# Patient Record
Sex: Female | Born: 1935 | Race: Black or African American | Hispanic: No | Smoking: Never smoker
Health system: Southern US, Community
[De-identification: ages and names within clinical notes are randomized; demographics above are authoritative.]

## PROBLEM LIST (undated history)

## (undated) DIAGNOSIS — G629 Polyneuropathy, unspecified: Secondary | ICD-10-CM

## (undated) DIAGNOSIS — E890 Postprocedural hypothyroidism: Secondary | ICD-10-CM

## (undated) DIAGNOSIS — I10 Essential (primary) hypertension: Secondary | ICD-10-CM

## (undated) DIAGNOSIS — R609 Edema, unspecified: Secondary | ICD-10-CM

## (undated) DIAGNOSIS — I2699 Other pulmonary embolism without acute cor pulmonale: Secondary | ICD-10-CM

## (undated) HISTORY — PX: TOTAL THYROIDECTOMY: SHX2547

---
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
## 2006-10-24 HISTORY — PX: REPLACEMENT TOTAL KNEE: SUR1224

## 2013-10-24 DIAGNOSIS — H3411 Central retinal artery occlusion, right eye: Secondary | ICD-10-CM

## 2013-10-24 HISTORY — DX: Central retinal artery occlusion, right eye: H34.11

## 2017-10-24 HISTORY — PX: COLONOSCOPY: SHX174

## 2018-12-23 DIAGNOSIS — I8290 Acute embolism and thrombosis of unspecified vein: Secondary | ICD-10-CM

## 2018-12-23 HISTORY — DX: Acute embolism and thrombosis of unspecified vein: I82.90

## 2020-08-27 DIAGNOSIS — Z86711 Personal history of pulmonary embolism: Secondary | ICD-10-CM | POA: Diagnosis not present

## 2020-08-27 DIAGNOSIS — E039 Hypothyroidism, unspecified: Secondary | ICD-10-CM | POA: Diagnosis not present

## 2020-08-27 DIAGNOSIS — Z7901 Long term (current) use of anticoagulants: Secondary | ICD-10-CM | POA: Diagnosis not present

## 2020-08-27 DIAGNOSIS — I6930 Unspecified sequelae of cerebral infarction: Secondary | ICD-10-CM | POA: Diagnosis not present

## 2020-09-12 ENCOUNTER — Encounter (HOSPITAL_COMMUNITY): Payer: Self-pay | Admitting: *Deleted

## 2020-09-12 ENCOUNTER — Other Ambulatory Visit: Payer: Self-pay

## 2020-09-12 ENCOUNTER — Emergency Department (HOSPITAL_COMMUNITY)
Admission: EM | Admit: 2020-09-12 | Discharge: 2020-09-12 | Disposition: A | Discharge status: Home / Self Care | Payer: Medicare Other | Attending: Emergency Medicine | Admitting: Emergency Medicine

## 2020-09-12 ENCOUNTER — Emergency Department (HOSPITAL_COMMUNITY): Payer: Medicare Other

## 2020-09-12 DIAGNOSIS — Z79899 Other long term (current) drug therapy: Secondary | ICD-10-CM | POA: Diagnosis not present

## 2020-09-12 DIAGNOSIS — R1032 Left lower quadrant pain: Secondary | ICD-10-CM | POA: Insufficient documentation

## 2020-09-12 DIAGNOSIS — R0602 Shortness of breath: Secondary | ICD-10-CM | POA: Diagnosis not present

## 2020-09-12 DIAGNOSIS — K8020 Calculus of gallbladder without cholecystitis without obstruction: Secondary | ICD-10-CM | POA: Diagnosis not present

## 2020-09-12 DIAGNOSIS — Z7901 Long term (current) use of anticoagulants: Secondary | ICD-10-CM | POA: Diagnosis not present

## 2020-09-12 DIAGNOSIS — R1031 Right lower quadrant pain: Secondary | ICD-10-CM | POA: Insufficient documentation

## 2020-09-12 DIAGNOSIS — I898 Other specified noninfective disorders of lymphatic vessels and lymph nodes: Secondary | ICD-10-CM | POA: Diagnosis not present

## 2020-09-12 DIAGNOSIS — R1030 Lower abdominal pain, unspecified: Secondary | ICD-10-CM | POA: Diagnosis not present

## 2020-09-12 DIAGNOSIS — R109 Unspecified abdominal pain: Secondary | ICD-10-CM

## 2020-09-12 DIAGNOSIS — N281 Cyst of kidney, acquired: Secondary | ICD-10-CM | POA: Diagnosis not present

## 2020-09-12 DIAGNOSIS — K5730 Diverticulosis of large intestine without perforation or abscess without bleeding: Secondary | ICD-10-CM | POA: Diagnosis not present

## 2020-09-12 HISTORY — DX: Other pulmonary embolism without acute cor pulmonale: I26.99

## 2020-09-12 LAB — URINALYSIS, ROUTINE W REFLEX MICROSCOPIC
Bilirubin Urine: NEGATIVE
Glucose, UA: NEGATIVE mg/dL
Hgb urine dipstick: NEGATIVE
Ketones, ur: NEGATIVE mg/dL
Leukocytes,Ua: NEGATIVE
Nitrite: NEGATIVE
Protein, ur: NEGATIVE mg/dL
Specific Gravity, Urine: 1.039 — ABNORMAL HIGH (ref 1.005–1.030)
pH: 7 (ref 5.0–8.0)

## 2020-09-12 LAB — CBC
HCT: 39.9 % (ref 36.0–46.0)
Hemoglobin: 13 g/dL (ref 12.0–15.0)
MCH: 28.4 pg (ref 26.0–34.0)
MCHC: 32.6 g/dL (ref 30.0–36.0)
MCV: 87.1 fL (ref 80.0–100.0)
Platelets: 169 10*3/uL (ref 150–400)
RBC: 4.58 MIL/uL (ref 3.87–5.11)
RDW: 13.9 % (ref 11.5–15.5)
WBC: 4.3 10*3/uL (ref 4.0–10.5)
nRBC: 0 % (ref 0.0–0.2)

## 2020-09-12 LAB — TROPONIN I (HIGH SENSITIVITY)
Troponin I (High Sensitivity): <2 ng/L (ref <18)
Troponin I (High Sensitivity): <2 ng/L (ref <18)

## 2020-09-12 LAB — COMPREHENSIVE METABOLIC PANEL
ALT: 11 U/L (ref 0–44)
AST: 17 U/L (ref 15–41)
Albumin: 3.5 g/dL (ref 3.5–5.0)
Alkaline Phosphatase: 77 U/L (ref 38–126)
Anion gap: 8 (ref 5–15)
BUN: 8 mg/dL (ref 8–23)
CO2: 26 mmol/L (ref 22–32)
Calcium: 9.1 mg/dL (ref 8.9–10.3)
Chloride: 105 mmol/L (ref 98–111)
Creatinine, Ser: 0.77 mg/dL (ref 0.44–1.00)
GFR, Estimated: >60 mL/min (ref >60)
Glucose, Bld: 111 mg/dL — ABNORMAL HIGH (ref 70–99)
Potassium: 3.8 mmol/L (ref 3.5–5.1)
Sodium: 139 mmol/L (ref 135–145)
Total Bilirubin: 0.8 mg/dL (ref 0.3–1.2)
Total Protein: 7 g/dL (ref 6.5–8.1)

## 2020-09-12 LAB — LIPASE, BLOOD: Lipase: 27 U/L (ref 11–51)

## 2020-09-12 MED ORDER — SODIUM CHLORIDE 0.9 % IV BOLUS
500.0000 mL | Freq: Once | INTRAVENOUS | Status: AC
  Administered 2020-09-12: 500 mL via INTRAVENOUS

## 2020-09-12 MED ORDER — IOHEXOL 300 MG/ML  SOLN
100.0000 mL | Freq: Once | INTRAMUSCULAR | Status: AC | PRN
  Administered 2020-09-12: 100 mL via INTRAVENOUS

## 2020-09-12 MED ORDER — FENTANYL CITRATE (PF) 100 MCG/2ML IJ SOLN
50.0000 ug | Freq: Once | INTRAMUSCULAR | Status: AC
  Administered 2020-09-12: 50 ug via INTRAVENOUS
  Filled 2020-09-12: qty 2

## 2020-09-12 NOTE — ED Notes (Signed)
Pt transported to CT ?

## 2020-09-12 NOTE — ED Provider Notes (Signed)
[facility] EMERGENCY DEPARTMENT Provider Note   CSN: [id number] Arrival date & time: 09/12/20  6503     History Chief Complaint  Patient presents with  . Abdominal Pain    [name] is a 86 y.o. female.  HPI 86 year old female presents with abdominal pain. Started 2 days ago but seems to be worse this morning. Diffusely lower abdominal pain. Feels like a burning sensation. She has been urinating more but also has been taking her as needed Lasix more. She feels a little short of breath but no chest pain. No vomiting or diarrhea/constipation. Stools were dark recently but not bloody. She is on Eliquis for pulmonary embolism and DVT. Pain is currently severe. Some back pain.   Past Medical History:  Diagnosis Date  . Pulmonary emboli (HCC)     There are no problems to display for this patient.   History reviewed. No pertinent surgical history.   OB History   No obstetric history on file.     No family history on file.  Social History   Tobacco Use  . Smoking status: Never Smoker  . Smokeless tobacco: Never Used  Substance Use Topics  . Alcohol use: Never  . Drug use: Never    Home Medications Prior to Admission medications   Medication Sig Start Date End Date Taking? Authorizing Provider  apixaban (ELIQUIS) 5 MG TABS tablet Take 5 mg by mouth 2 (two) times daily.   Yes [provider]  furosemide (LASIX) 20 MG tablet Take 20 mg by mouth as needed for fluid.   Yes [provider]  levothyroxine (SYNTHROID) 75 MCG tablet Take 75 mcg by mouth daily before breakfast.   Yes [provider]    Allergies    Codeine  Review of Systems   Review of Systems  Constitutional: Negative for fever.  Respiratory: Positive for shortness of breath.   Cardiovascular: Negative for chest pain.  Gastrointestinal: Positive for abdominal pain. Negative for blood in stool, diarrhea and vomiting.  Genitourinary: Positive for  frequency. Negative for dysuria.  Musculoskeletal: Positive for back pain.  All other systems reviewed and are negative.   Physical Exam Updated Vital Signs BP (!) 161/83   Pulse 70   Temp 97.9 F (36.6 C) (Oral)   Resp (!) 23   Ht 5\' 7"  (1.702 m)   Wt 90.3 kg   SpO2 98%   BMI 31.17 kg/m   Physical Exam Vitals and nursing note reviewed.  Constitutional:      Appearance: She is well-developed. She is not ill-appearing or diaphoretic.     Comments: Appears uncomfortable/in pain  HENT:     Head: Normocephalic and atraumatic.     Right Ear: External ear normal.     Left Ear: External ear normal.     Nose: Nose normal.  Eyes:     General:        Right eye: No discharge.        Left eye: No discharge.  Cardiovascular:     Rate and Rhythm: Normal rate and regular rhythm.     Heart sounds: Normal heart sounds.  Pulmonary:     Effort: Pulmonary effort is normal.     Breath sounds: Normal breath sounds.  Abdominal:     Palpations: Abdomen is soft.     Tenderness: There is abdominal tenderness in the right lower quadrant, suprapubic area and left lower quadrant. There is left CVA tenderness.  Skin:    General: Skin is  warm and dry.  Neurological:     Mental Status: She is alert.  Psychiatric:        Mood and Affect: Mood is not anxious.     ED Results / Procedures / Treatments   Labs (all labs ordered are listed, but only abnormal results are displayed) Labs Reviewed  COMPREHENSIVE METABOLIC PANEL - Abnormal; Notable for the following components:      Result Value   Glucose, Bld 111 (*)    All other components within normal limits  URINALYSIS, ROUTINE W REFLEX MICROSCOPIC - Abnormal; Notable for the following components:   Color, Urine STRAW (*)    Specific Gravity, Urine 1.039 (*)    All other components within normal limits  LIPASE, BLOOD  CBC  TROPONIN I (HIGH SENSITIVITY)  TROPONIN I (HIGH SENSITIVITY)    EKG EKG Interpretation  Date/Time:  Saturday  September 12 2020 10:03:28 EST Ventricular Rate:  70 PR Interval:  166 QRS Duration: 78 QT Interval:  398 QTC Calculation: 429 R Axis:   75 Text Interpretation: Normal sinus rhythm Possible Left atrial enlargement Borderline ECG No old tracing to compare Confirmed by Sherwood Gambler [phone]) on 09/12/2020 10:36:30 AM   Radiology CT ABDOMEN PELVIS W CONTRAST  Result Date: 09/12/2020 CLINICAL DATA:  Possible diverticulitis. EXAM: CT ABDOMEN AND PELVIS WITH CONTRAST TECHNIQUE: Multidetector CT imaging of the abdomen and pelvis was performed using the standard protocol following bolus administration of intravenous contrast. CONTRAST:  19mL OMNIPAQUE IOHEXOL 300 MG/ML  SOLN COMPARISON:  None. FINDINGS: Lower chest: Lung bases demonstrate a calcified granuloma over the left lower lobe with minimal adjacent linear atelectasis. Elevation of the left hemidiaphragm. Moderate calcified plaque over the mitral valve annulus. Calcified plaque over the coronary arteries. Calcified subcarinal lymph nodes. Hepatobiliary: Mild cholelithiasis. Liver and biliary tree are unremarkable. Pancreas: Normal. Spleen: Normal. Adrenals/Urinary Tract: Adrenal glands are normal. Kidneys are normal in size without hydronephrosis or nephrolithiasis. Bilateral renal cysts are present. Ureters and bladder are normal. Stomach/Bowel: Stomach and small bowel are normal. Appendix not well visualized. Moderate diverticulosis of the colon most prominent over the descending and sigmoid colon. No evidence of acute inflammation. Vascular/Lymphatic: Moderate calcified plaque over the abdominal aorta which is normal in caliber. No evidence of adenopathy. Reproductive: Previous hysterectomy. Other: No free fluid or focal inflammatory change. Musculoskeletal: Degenerative change of the spine and hips. IMPRESSION: 1. No acute findings in the abdomen/pelvis. 2. Colonic diverticulosis without evidence of acute inflammation. 3. Mild cholelithiasis. 4.  Bilateral renal cysts. 5. Aortic atherosclerosis.  Atherosclerotic coronary artery disease. Aortic Atherosclerosis (ICD10-I70.0). Electronically Signed   By: Marin Olp M.D.   On: 09/12/2020 11:10   DG Chest Portable 1 View  Result Date: 09/12/2020 CLINICAL DATA:  Dyspnea.  Abdominal pain. EXAM: PORTABLE CHEST 1 VIEW COMPARISON:  None. FINDINGS: The heart size and mediastinal contours are within normal limits. Aortic atherosclerosis. No consolidation. Left lower lobe calcified granuloma. No visible pleural effusions or pneumothorax. Elevated left hemidiaphragm. No acute osseous abnormality. Left shoulder degenerative change. IMPRESSION: 1. No acute cardiopulmonary disease. 2. Elevated left hemidiaphragm. Electronically Signed   By: Margaretha Sheffield MD   On: 09/12/2020 10:46    Procedures Procedures (including critical care time)  Medications Ordered in ED Medications  fentaNYL (SUBLIMAZE) injection 50 mcg (50 mcg Intravenous Given 09/12/20 1012)  iohexol (OMNIPAQUE) 300 MG/ML solution 100 mL (100 mLs Intravenous Contrast Given 09/12/20 1036)  sodium chloride 0.9 % bolus 500 mL (0 mLs Intravenous Stopped 09/12/20 1215)  ED Course  I have reviewed the triage vital signs and the nursing notes.  Pertinent labs & imaging results that were available during my care of the patient were reviewed by me and considered in my medical decision making (see chart for details).    MDM Rules/Calculators/A&P                          Patient CT scan was personally reviewed and is unremarkable.  Patient's pain is actually better when she eats and so I think mesenteric ischemia is unlikely.  Given no secondary ischemic signs on CT and while this was not a CTA, I think mesenteric ischemia is quite likely. I don't think a lactate would be helpful given negative findings otherwise and history is not c/w ischemia. Given food, which she tolerated well and did not cause any increase in abdominal pain. Troponin  checked given age, is negative x2.  At this point, she states she "does not feel well" but no focal findings.  Her abdomen seems to be better.  There is no obvious cause of this including normal telemetry monitoring.  At this point, no clear findings and while she does not feel 100%, she wants to go home which I think is reasonable.  We discussed return precautions and to return if symptoms do not improve. Final Clinical Impression(s) / ED Diagnoses Final diagnoses:  Nonspecific abdominal pain    Rx / DC Orders ED Discharge Orders    None       Sherwood Gambler, MD 09/12/20 1440

## 2020-09-12 NOTE — ED Notes (Signed)
Pt d/c home per MD order. Discharge summary reviewed with pt, pt verbalizes understanding. No s/s of acute distress noted at discharge . Off unit via [place] d/c home with daughter

## 2020-09-12 NOTE — ED Notes (Signed)
Culture sent with UA 

## 2020-09-12 NOTE — Discharge Instructions (Signed)
If you develop worsening, continued, or recurrent abdominal pain, uncontrolled vomiting, fever, chest or back pain, or any other new/concerning symptoms then return to the ER for evaluation.  

## 2020-09-12 NOTE — ED Triage Notes (Signed)
C/o abd. Pain onset yest  Denies n/v/d , states she had a bowel movement this am and it was darker than normal. States she had a blood clot in her left leg and lung 1 year ago.

## 2020-09-27 ENCOUNTER — Encounter (HOSPITAL_COMMUNITY): Payer: Self-pay | Admitting: Internal Medicine

## 2020-09-27 ENCOUNTER — Emergency Department (HOSPITAL_COMMUNITY): Payer: Medicare Other

## 2020-09-27 ENCOUNTER — Inpatient Hospital Stay (HOSPITAL_COMMUNITY): Payer: Medicare Other

## 2020-09-27 ENCOUNTER — Other Ambulatory Visit: Payer: Self-pay

## 2020-09-27 ENCOUNTER — Inpatient Hospital Stay (HOSPITAL_COMMUNITY)
Admission: EM | Admit: 2020-09-27 | Discharge: 2020-10-05 | DRG: 552 | Disposition: A | Discharge status: Home Health | Payer: Medicare Other | Attending: Student | Admitting: Student

## 2020-09-27 DIAGNOSIS — I10 Essential (primary) hypertension: Secondary | ICD-10-CM | POA: Diagnosis present

## 2020-09-27 DIAGNOSIS — I8290 Acute embolism and thrombosis of unspecified vein: Secondary | ICD-10-CM | POA: Diagnosis present

## 2020-09-27 DIAGNOSIS — E890 Postprocedural hypothyroidism: Secondary | ICD-10-CM | POA: Diagnosis present

## 2020-09-27 DIAGNOSIS — W19XXXA Unspecified fall, initial encounter: Principal | ICD-10-CM

## 2020-09-27 DIAGNOSIS — G629 Polyneuropathy, unspecified: Secondary | ICD-10-CM

## 2020-09-27 DIAGNOSIS — S22000A Wedge compression fracture of unspecified thoracic vertebra, initial encounter for closed fracture: Secondary | ICD-10-CM

## 2020-09-27 DIAGNOSIS — S32018A Other fracture of first lumbar vertebra, initial encounter for closed fracture: Secondary | ICD-10-CM

## 2020-09-27 DIAGNOSIS — I110 Hypertensive heart disease with heart failure: Secondary | ICD-10-CM | POA: Diagnosis present

## 2020-09-27 DIAGNOSIS — D649 Anemia, unspecified: Secondary | ICD-10-CM | POA: Diagnosis not present

## 2020-09-27 DIAGNOSIS — D509 Iron deficiency anemia, unspecified: Secondary | ICD-10-CM | POA: Diagnosis present

## 2020-09-27 DIAGNOSIS — Y92009 Unspecified place in unspecified non-institutional (private) residence as the place of occurrence of the external cause: Secondary | ICD-10-CM

## 2020-09-27 DIAGNOSIS — D696 Thrombocytopenia, unspecified: Secondary | ICD-10-CM | POA: Diagnosis present

## 2020-09-27 DIAGNOSIS — W01198A Fall on same level from slipping, tripping and stumbling with subsequent striking against other object, initial encounter: Secondary | ICD-10-CM | POA: Diagnosis present

## 2020-09-27 DIAGNOSIS — E559 Vitamin D deficiency, unspecified: Secondary | ICD-10-CM | POA: Diagnosis present

## 2020-09-27 DIAGNOSIS — S22088A Other fracture of T11-T12 vertebra, initial encounter for closed fracture: Secondary | ICD-10-CM | POA: Diagnosis not present

## 2020-09-27 DIAGNOSIS — I5032 Chronic diastolic (congestive) heart failure: Secondary | ICD-10-CM | POA: Diagnosis present

## 2020-09-27 DIAGNOSIS — S22089A Unspecified fracture of T11-T12 vertebra, initial encounter for closed fracture: Principal | ICD-10-CM | POA: Diagnosis present

## 2020-09-27 DIAGNOSIS — M2550 Pain in unspecified joint: Secondary | ICD-10-CM | POA: Diagnosis not present

## 2020-09-27 DIAGNOSIS — G9389 Other specified disorders of brain: Secondary | ICD-10-CM | POA: Diagnosis not present

## 2020-09-27 DIAGNOSIS — M47812 Spondylosis without myelopathy or radiculopathy, cervical region: Secondary | ICD-10-CM | POA: Diagnosis not present

## 2020-09-27 DIAGNOSIS — S22080A Wedge compression fracture of T11-T12 vertebra, initial encounter for closed fracture: Secondary | ICD-10-CM | POA: Diagnosis not present

## 2020-09-27 DIAGNOSIS — Z7901 Long term (current) use of anticoagulants: Secondary | ICD-10-CM | POA: Diagnosis not present

## 2020-09-27 DIAGNOSIS — Z7401 Bed confinement status: Secondary | ICD-10-CM | POA: Diagnosis not present

## 2020-09-27 DIAGNOSIS — S32018D Other fracture of first lumbar vertebra, subsequent encounter for fracture with routine healing: Secondary | ICD-10-CM | POA: Diagnosis not present

## 2020-09-27 DIAGNOSIS — M48061 Spinal stenosis, lumbar region without neurogenic claudication: Secondary | ICD-10-CM | POA: Diagnosis present

## 2020-09-27 DIAGNOSIS — Z96651 Presence of right artificial knee joint: Secondary | ICD-10-CM | POA: Diagnosis present

## 2020-09-27 DIAGNOSIS — M47816 Spondylosis without myelopathy or radiculopathy, lumbar region: Secondary | ICD-10-CM | POA: Diagnosis present

## 2020-09-27 DIAGNOSIS — Z885 Allergy status to narcotic agent status: Secondary | ICD-10-CM | POA: Diagnosis not present

## 2020-09-27 DIAGNOSIS — M545 Low back pain, unspecified: Secondary | ICD-10-CM | POA: Diagnosis not present

## 2020-09-27 DIAGNOSIS — Z20822 Contact with and (suspected) exposure to covid-19: Secondary | ICD-10-CM | POA: Diagnosis present

## 2020-09-27 DIAGNOSIS — R300 Dysuria: Secondary | ICD-10-CM | POA: Diagnosis not present

## 2020-09-27 DIAGNOSIS — Z86718 Personal history of other venous thrombosis and embolism: Secondary | ICD-10-CM

## 2020-09-27 DIAGNOSIS — Z66 Do not resuscitate: Secondary | ICD-10-CM | POA: Diagnosis present

## 2020-09-27 DIAGNOSIS — R5381 Other malaise: Secondary | ICD-10-CM | POA: Diagnosis present

## 2020-09-27 DIAGNOSIS — M5136 Other intervertebral disc degeneration, lumbar region: Secondary | ICD-10-CM | POA: Diagnosis not present

## 2020-09-27 DIAGNOSIS — B9620 Unspecified Escherichia coli [E. coli] as the cause of diseases classified elsewhere: Secondary | ICD-10-CM | POA: Diagnosis present

## 2020-09-27 DIAGNOSIS — M4800 Spinal stenosis, site unspecified: Secondary | ICD-10-CM | POA: Diagnosis not present

## 2020-09-27 DIAGNOSIS — S0990XA Unspecified injury of head, initial encounter: Secondary | ICD-10-CM | POA: Diagnosis not present

## 2020-09-27 DIAGNOSIS — S32010A Wedge compression fracture of first lumbar vertebra, initial encounter for closed fracture: Secondary | ICD-10-CM | POA: Diagnosis not present

## 2020-09-27 DIAGNOSIS — M47817 Spondylosis without myelopathy or radiculopathy, lumbosacral region: Secondary | ICD-10-CM | POA: Diagnosis not present

## 2020-09-27 DIAGNOSIS — I82492 Acute embolism and thrombosis of other specified deep vein of left lower extremity: Secondary | ICD-10-CM | POA: Diagnosis not present

## 2020-09-27 DIAGNOSIS — E669 Obesity, unspecified: Secondary | ICD-10-CM | POA: Diagnosis present

## 2020-09-27 DIAGNOSIS — Z6836 Body mass index (BMI) 36.0-36.9, adult: Secondary | ICD-10-CM | POA: Diagnosis not present

## 2020-09-27 DIAGNOSIS — K5900 Constipation, unspecified: Secondary | ICD-10-CM | POA: Diagnosis present

## 2020-09-27 DIAGNOSIS — N390 Urinary tract infection, site not specified: Secondary | ICD-10-CM | POA: Diagnosis not present

## 2020-09-27 DIAGNOSIS — M549 Dorsalgia, unspecified: Secondary | ICD-10-CM | POA: Diagnosis not present

## 2020-09-27 DIAGNOSIS — D1809 Hemangioma of other sites: Secondary | ICD-10-CM | POA: Diagnosis present

## 2020-09-27 DIAGNOSIS — R531 Weakness: Secondary | ICD-10-CM | POA: Diagnosis not present

## 2020-09-27 DIAGNOSIS — Z86711 Personal history of pulmonary embolism: Secondary | ICD-10-CM | POA: Diagnosis not present

## 2020-09-27 DIAGNOSIS — M4692 Unspecified inflammatory spondylopathy, cervical region: Secondary | ICD-10-CM | POA: Diagnosis not present

## 2020-09-27 DIAGNOSIS — N10 Acute tubulo-interstitial nephritis: Secondary | ICD-10-CM | POA: Diagnosis not present

## 2020-09-27 DIAGNOSIS — I6782 Cerebral ischemia: Secondary | ICD-10-CM | POA: Diagnosis not present

## 2020-09-27 DIAGNOSIS — Z87828 Personal history of other (healed) physical injury and trauma: Secondary | ICD-10-CM | POA: Diagnosis not present

## 2020-09-27 HISTORY — DX: Postprocedural hypothyroidism: E89.0

## 2020-09-27 HISTORY — DX: Essential (primary) hypertension: I10

## 2020-09-27 HISTORY — DX: Edema, unspecified: R60.9

## 2020-09-27 HISTORY — DX: Polyneuropathy, unspecified: G62.9

## 2020-09-27 LAB — COMPREHENSIVE METABOLIC PANEL
ALT: 16 U/L (ref 0–44)
AST: 20 U/L (ref 15–41)
Albumin: 3.3 g/dL — ABNORMAL LOW (ref 3.5–5.0)
Alkaline Phosphatase: 93 U/L (ref 38–126)
Anion gap: 9 (ref 5–15)
BUN: 13 mg/dL (ref 8–23)
CO2: 24 mmol/L (ref 22–32)
Calcium: 9.2 mg/dL (ref 8.9–10.3)
Chloride: 108 mmol/L (ref 98–111)
Creatinine, Ser: 0.77 mg/dL (ref 0.44–1.00)
GFR, Estimated: >60 mL/min (ref >60)
Glucose, Bld: 116 mg/dL — ABNORMAL HIGH (ref 70–99)
Potassium: 3.7 mmol/L (ref 3.5–5.1)
Sodium: 141 mmol/L (ref 135–145)
Total Bilirubin: 0.5 mg/dL (ref 0.3–1.2)
Total Protein: 6.5 g/dL (ref 6.5–8.1)

## 2020-09-27 LAB — CBC WITH DIFFERENTIAL/PLATELET
Abs Immature Granulocytes: 0.11 10*3/uL — ABNORMAL HIGH (ref 0.00–0.07)
Basophils Absolute: 0 10*3/uL (ref 0.0–0.1)
Basophils Relative: 1 %
Eosinophils Absolute: 0.1 10*3/uL (ref 0.0–0.5)
Eosinophils Relative: 2 %
HCT: 37.5 % (ref 36.0–46.0)
Hemoglobin: 12.5 g/dL (ref 12.0–15.0)
Immature Granulocytes: 2 %
Lymphocytes Relative: 31 %
Lymphs Abs: 1.8 10*3/uL (ref 0.7–4.0)
MCH: 28.7 pg (ref 26.0–34.0)
MCHC: 33.3 g/dL (ref 30.0–36.0)
MCV: 86 fL (ref 80.0–100.0)
Monocytes Absolute: 0.5 10*3/uL (ref 0.1–1.0)
Monocytes Relative: 8 %
Neutro Abs: 3.2 10*3/uL (ref 1.7–7.7)
Neutrophils Relative %: 56 %
Platelets: 149 10*3/uL — ABNORMAL LOW (ref 150–400)
RBC: 4.36 MIL/uL (ref 3.87–5.11)
RDW: 14.3 % (ref 11.5–15.5)
WBC: 5.7 10*3/uL (ref 4.0–10.5)
nRBC: 0 % (ref 0.0–0.2)

## 2020-09-27 LAB — RESP PANEL BY RT-PCR (FLU A&B, COVID) ARPGX2
Influenza A by PCR: NEGATIVE
Influenza B by PCR: NEGATIVE
SARS Coronavirus 2 by RT PCR: NEGATIVE

## 2020-09-27 MED ORDER — ACETAMINOPHEN 650 MG RE SUPP: 650.0000 mg | Freq: Four times a day (QID) | RECTAL | Status: DC | PRN

## 2020-09-27 MED ORDER — FENTANYL CITRATE (PF) 100 MCG/2ML IJ SOLN
50.0000 ug | Freq: Once | INTRAMUSCULAR | Status: AC
  Administered 2020-09-27: 50 ug via INTRAVENOUS
  Filled 2020-09-27: qty 2

## 2020-09-27 MED ORDER — HYDRALAZINE HCL 20 MG/ML IJ SOLN: 5.0000 mg | INTRAMUSCULAR | Status: DC | PRN

## 2020-09-27 MED ORDER — LEVOTHYROXINE SODIUM 75 MCG PO TABS
75.0000 ug | ORAL_TABLET | Freq: Every day | ORAL | Status: DC
  Administered 2020-09-28 – 2020-10-05 (×8): 75 ug via ORAL
  Filled 2020-09-27 (×8): qty 1

## 2020-09-27 MED ORDER — POLYETHYLENE GLYCOL 3350 17 G PO PACK: 17.0000 g | PACK | Freq: Every day | ORAL | Status: DC | PRN

## 2020-09-27 MED ORDER — HYDROCODONE-ACETAMINOPHEN 5-325 MG PO TABS
1.0000 | ORAL_TABLET | ORAL | Status: DC | PRN
  Administered 2020-09-28: 2 via ORAL
  Filled 2020-09-27: qty 2

## 2020-09-27 MED ORDER — PREGABALIN 75 MG PO CAPS
75.0000 mg | ORAL_CAPSULE | Freq: Two times a day (BID) | ORAL | Status: DC
  Administered 2020-09-27 – 2020-10-05 (×16): 75 mg via ORAL
  Filled 2020-09-27 (×16): qty 1

## 2020-09-27 MED ORDER — ONDANSETRON HCL 4 MG PO TABS: 4.0000 mg | ORAL_TABLET | Freq: Four times a day (QID) | ORAL | Status: DC | PRN

## 2020-09-27 MED ORDER — MORPHINE SULFATE (PF) 2 MG/ML IV SOLN
2.0000 mg | INTRAVENOUS | Status: DC | PRN
  Administered 2020-09-27 – 2020-09-28 (×4): 2 mg via INTRAVENOUS
  Filled 2020-09-27 (×4): qty 1

## 2020-09-27 MED ORDER — ACETAMINOPHEN 325 MG PO TABS
650.0000 mg | ORAL_TABLET | Freq: Four times a day (QID) | ORAL | Status: DC | PRN
  Administered 2020-09-28 (×2): 650 mg via ORAL
  Filled 2020-09-27 (×2): qty 2

## 2020-09-27 MED ORDER — ONDANSETRON HCL 4 MG/2ML IJ SOLN
4.0000 mg | Freq: Four times a day (QID) | INTRAMUSCULAR | Status: DC | PRN
  Administered 2020-09-27: 4 mg via INTRAVENOUS
  Filled 2020-09-27: qty 2

## 2020-09-27 MED ORDER — DOCUSATE SODIUM 100 MG PO CAPS
100.0000 mg | ORAL_CAPSULE | Freq: Two times a day (BID) | ORAL | Status: DC
  Administered 2020-09-27 – 2020-10-05 (×16): 100 mg via ORAL
  Filled 2020-09-27 (×16): qty 1

## 2020-09-27 MED ORDER — ACETAMINOPHEN 500 MG PO TABS
1000.0000 mg | ORAL_TABLET | Freq: Once | ORAL | Status: AC
  Administered 2020-09-27: 1000 mg via ORAL
  Filled 2020-09-27: qty 2

## 2020-09-27 MED ORDER — BISACODYL 5 MG PO TBEC: 5.0000 mg | DELAYED_RELEASE_TABLET | Freq: Every day | ORAL | Status: DC | PRN

## 2020-09-27 NOTE — Consult Note (Signed)
Reason for Consult: Compression fracture Referring Physician: Blanchie Dessert, MD  HPI: [name] is a 86 y.o. female on Eliquis with an acute onset of back pain after suffering a fall from loss of balance. The patient was reported to have fallen backwards, striking her head on the wall, and ultimately landing on her buttocks. Patient is on Eliquis but major complaint is of lumbosacral back pain.  She denies any headache, N/V, AMS, neck pain, numbness, tingling, weakness, or pain in her extremities. Endorses lumbosacral pain that is exacerbated with movement and palpation.  CT head was negative for any acute intracranial abnormality. CT abdomen and pelvis revealed mild compression fracture at the T12 vertebral body.   Past Medical History:  Diagnosis Date  . Central retinal artery occlusion of right eye 2015   lost complete vision  . Edema   . Hypertension    not on BP medication currently  . Neuropathy   . Post-surgical hypothyroidism   . VTE (venous thromboembolism) 12/2018   LLE DVT and PE    Past Surgical History:  Procedure Laterality Date  . REPLACEMENT TOTAL KNEE Right 2008  . TOTAL THYROIDECTOMY      History reviewed. No pertinent family history.  Social History:  reports that she has never smoked. She has never used smokeless tobacco. She reports that she does not drink alcohol and does not use drugs.  Allergies:  Allergies  Allergen Reactions  . Codeine     Medications: I have reviewed the patient's current medications.  Results for orders placed or performed during the hospital encounter of 09/27/20 (from the past 48 hour(s))  CBC with Differential/Platelet     Status: Abnormal   Collection Time: 09/27/20  6:21 AM  Result Value Ref Range   WBC 5.7 4.0 - 10.5 K/uL   RBC 4.36 3.87 - 5.11 MIL/uL   Hemoglobin 12.5 12.0 - 15.0 g/dL   HCT 37.5 36 - 46 %   MCV 86.0 80.0 - 100.0 fL   MCH 28.7 26.0 - 34.0 pg   MCHC 33.3 30.0 - 36.0 g/dL   RDW 14.3 11.5 - 15.5  %   Platelets 149 (L) 150 - 400 K/uL   nRBC 0.0 0.0 - 0.2 %   Neutrophils Relative % 56 %   Neutro Abs 3.2 1.7 - 7.7 K/uL   Lymphocytes Relative 31 %   Lymphs Abs 1.8 0.7 - 4.0 K/uL   Monocytes Relative 8 %   Monocytes Absolute 0.5 0.1 - 1.0 K/uL   Eosinophils Relative 2 %   Eosinophils Absolute 0.1 0.0 - 0.5 K/uL   Basophils Relative 1 %   Basophils Absolute 0.0 0.0 - 0.1 K/uL   Immature Granulocytes 2 %   Abs Immature Granulocytes 0.11 (H) 0.00 - 0.07 K/uL    Comment: Performed at [facility], [address], [place], [place] [address]  Comprehensive metabolic panel     Status: Abnormal   Collection Time: 09/27/20  6:21 AM  Result Value Ref Range   Sodium 141 135 - 145 mmol/L   Potassium 3.7 3.5 - 5.1 mmol/L   Chloride 108 98 - 111 mmol/L   CO2 24 22 - 32 mmol/L   Glucose, Bld 116 (H) 70 - 99 mg/dL    Comment: Glucose reference range applies only to samples taken after fasting for at least 8 hours.   BUN 13 8 - 23 mg/dL   Creatinine, Ser 0.77 0.44 - 1.00 mg/dL   Calcium 9.2 8.9 -  10.3 mg/dL   Total Protein 6.5 6.5 - 8.1 g/dL   Albumin 3.3 (L) 3.5 - 5.0 g/dL   AST 20 15 - 41 U/L   ALT 16 0 - 44 U/L   Alkaline Phosphatase 93 38 - 126 U/L   Total Bilirubin 0.5 0.3 - 1.2 mg/dL   GFR, Estimated >60 >60 mL/min    Comment: (NOTE) Calculated using the CKD-EPI Creatinine Equation (2021)    Anion gap 9 5 - 15    Comment: Performed at [facility] [address] [place], [place] [address]    CT ABDOMEN PELVIS WO CONTRAST  Result Date: 09/27/2020 CLINICAL DATA:  Fall, sacral pain, low back pain worse with movement. EXAM: CT ABDOMEN AND PELVIS WITHOUT CONTRAST TECHNIQUE: Multidetector CT imaging of the abdomen and pelvis was performed following the standard protocol without IV contrast. COMPARISON:  CT abdomen dated 09/12/2020 FINDINGS: Lower chest: Mild bibasilar scarring/atelectasis. Hepatobiliary: Small layering stones within the otherwise normal-appearing  gallbladder. No focal liver abnormality is seen. No bile duct dilatation. Pancreas: Unremarkable. No pancreatic ductal dilatation or surrounding inflammatory changes. Spleen: Unremarkable Adrenals/Urinary Tract: Bilateral renal cysts better demonstrated on earlier contrast-enhanced CT. No renal stone or hydronephrosis bilaterally. No perinephric fluid. No ureteral or bladder calculi are identified. Bladder is unremarkable, partially decompressed. Stomach/Bowel: No dilated large or small bowel loops. No evidence of bowel wall inflammation or bowel injury. Diverticulosis of the sigmoid and descending colon but no focal inflammatory change to suggest acute diverticulitis. Stomach is unremarkable, partially decompressed. Vascular/Lymphatic: Aortic atherosclerosis. No enlarged lymph nodes are seen. Reproductive: Presumed hysterectomy.  No adnexal mass or free fluid. Other: No free fluid or hemorrhage is seen within the abdomen or pelvis. No free intraperitoneal air. Musculoskeletal: Degenerative spondylosis of the thoracolumbar spine, moderate in degree, most pronounced degenerative hypertrophy within the posterior elements, with additional dystrophic/hypertrophic changes of the spinous processes of the lumbar spine and sacrum compatible with Baastrup's disease which can be a source of chronic low back pain. Additional advanced degenerative change at both hip joints. Prevertebral fluid stranding is present at the L1 vertebral body level. There is a subtle compression fracture deformity of the vertebral body which is most likely acute, with associated mild buckle fracture deformity along the anterior cortex, and probable nondisplaced fracture extension to the middle column to the RIGHT of midline. No convincing extension to the posterior elements. No associated vertebral body displacement/subluxation. IMPRESSION: 1. Subtle mild compression fracture deformity of the L1 vertebral body which is most likely ACUTE given the  associated fluid stranding/inflammation within the paravertebral soft tissues, with associated mild buckle fracture deformity along the anterior cortex and probable nondisplaced fracture extension to the middle column suggesting UNSTABLE vertebral body fracture. No associated vertebral body displacement/subluxation. Would consider lumbar spine MRI to confirm acuity and to more definitively characterize extent. 2. No acute findings within the abdomen or pelvis. No free fluid or hemorrhage. No evidence of bowel wall inflammation or bowel injury. No evidence of acute solid organ abnormality. No renal or ureteral calculi. 3. Colonic diverticulosis without evidence of acute diverticulitis. 4. Cholelithiasis without evidence of acute cholecystitis. 5. Chronic/degenerative changes of the lumbosacral spine, as detailed above. Aortic Atherosclerosis (ICD10-I70.0). Electronically Signed   By: Franki Cabot M.D.   On: 09/27/2020 08:28   CT Head Wo Contrast  Result Date: 09/27/2020 CLINICAL DATA:  Fall, head trauma. EXAM: CT HEAD WITHOUT CONTRAST TECHNIQUE: Contiguous axial images were obtained from the base of the skull through the vertex without  intravenous contrast. COMPARISON:  None. FINDINGS: Brain: Mild generalized age related parenchymal volume loss with commensurate dilatation of the ventricles and sulci. Mild chronic small vessel ischemic changes within the bilateral periventricular white matter regions. No mass, hemorrhage, edema or other evidence of acute parenchymal abnormality. No extra-axial hemorrhage. Vascular: Chronic calcified atherosclerotic changes of the large vessels at the skull base. No unexpected hyperdense vessel. Skull: Normal. Negative for fracture or focal lesion. Sinuses/Orbits: No acute finding. Other: None. IMPRESSION: 1. No acute findings. No intracranial mass, hemorrhage or edema. No skull fracture. 2. Mild chronic small vessel ischemic changes in the white matter. Electronically Signed    By: Franki Cabot M.D.   On: 09/27/2020 08:15    Review of systems per HPI Blood pressure 135/77, pulse 71, temperature 97.7 F (36.5 C), temperature source Oral, resp. rate 18, SpO2 95 %.  Physical Exam: Constitutional:      General: She is not in acute distress.    Appearance: She is well-developed. She is not diaphoretic.  Eyes: Pupils: Pupils are equal, round, and reactive to light.  Cardiovascular:     Rate and Rhythm: Normal rate and regular rhythm.     Pulses:          Radial pulses are 2+ on the right side and 2+ on the left side.       Dorsalis pedis pulses are 2+ on the right side and 2+ on the left side.     Heart sounds: Normal heart sounds. No murmur heard.  No friction rub. No gallop.   Pulmonary:     Effort: Pulmonary effort is normal. No respiratory distress.     Breath sounds: Normal breath sounds. No stridor. No wheezing.  Abdominal:     General: There is no distension.     Palpations: Abdomen is soft.     Tenderness: There is no abdominal tenderness.  Musculoskeletal:     Cervical back: Normal range of motion and neck supple. No bony tenderness.     Thoracic back: No bony tenderness.     Lumbar back: Tenderness and bony tenderness present.  Neurological:     Mental Status: She is alert and oriented to person, place, and time.     Comments: Moving all extremities  Assessment/Plan: 86 year old female with compression fracture of T12 vertebral body after fall. The fracture does not appear to be unstable. Proceed with MRI of lumbar spine. Patient has a significant amount of lumbosacral pain that is intensified with movement and palpation. Maintain TLSO brace. Admit to medicine service for pain control. PT/OT once appropriate. Neurosurgery to follow. Will need follow up as outpatient with A/P and lateral radiographs once discharged. Call with any questions.   Marvis Moeller, DNP, NP-C 09/27/2020, 1:12 PM

## 2020-09-27 NOTE — Progress Notes (Signed)
Orthopedic Tech Progress Note Patient Details:  Shakila Mak [birth year] [id number] Delivered TLSO brace to patient to be fitted upon admittance. Patient ID: Aamori Mcmasters, female   DOB: Mar 22, 1927, 86 y.o.   MRN: [id number]   Tammy Sours 09/27/2020, 5:07 PM

## 2020-09-27 NOTE — ED Triage Notes (Signed)
Pt arrive by EMS from home. Pt had a fall, states she lost her balance and fell head first into the wall as she tried to brace herself, no LOC Pt c/o 8/10 diffuse lower back pain that gets worse with movement  A&Ox4 Moves all extremities, no obvious deformities or open wounds

## 2020-09-27 NOTE — ED Notes (Signed)
Pt laid flat in preparation for MRI. Pt complaining of pain in back of left calf.  Pt given PRN morphine

## 2020-09-27 NOTE — ED Provider Notes (Signed)
[facility] EMERGENCY DEPARTMENT Provider Note  CSN: [id number] Arrival date & time: 09/27/20 9381  Chief Complaint(s) Fall  HPI [name] is a 86 y.o. female patient presents as a level 2 trauma after mechanical fall while at home resulting in sacral pain.  Patient also reported bumping her head on the wall.  She is anticoagulated on Eliquis.  She denies any headache or neck pain.  Denies any extremity pain.  Endorses lumbosacral pain that is exacerbated with movement and palpation.  No chest pain or shortness of breath.  Denies any other physical complaints  HPI  Past Medical History No past medical history on file. There are no problems to display for this patient.  Home Medication(s) Prior to Admission medications   Not on File                                                                                                                                    Past Surgical History ** The histories are not reviewed yet. Please review them in the "History" navigator section and refresh this [place]. Family History No family history on file.  Social History Social History   Tobacco Use  . Smoking status: Not on file  Substance Use Topics  . Alcohol use: Not on file  . Drug use: Not on file   Allergies Codeine  Review of Systems Review of Systems All other systems are reviewed and are negative for acute change except as noted in the HPI  Physical Exam Vital Signs  I have reviewed the triage vital signs BP 133/68   Pulse 69   Temp 97.9 F (36.6 C) (Oral)   Resp (!) 36   SpO2 95%   Physical Exam Constitutional:      General: She is not in acute distress.    Appearance: She is well-developed. She is not diaphoretic.  HENT:     Head: Normocephalic and atraumatic.     Right Ear: External ear normal.     Left Ear: External ear normal.     Nose: Nose normal.  Eyes:     General: No scleral icterus.       Right eye: No discharge.         Left eye: No discharge.     Conjunctiva/sclera: Conjunctivae normal.     Pupils: Pupils are equal, round, and reactive to light.  Cardiovascular:     Rate and Rhythm: Normal rate and regular rhythm.     Pulses:          Radial pulses are 2+ on the right side and 2+ on the left side.       Dorsalis pedis pulses are 2+ on the right side and 2+ on the left side.     Heart sounds: Normal heart sounds. No murmur heard.  No friction rub. No gallop.   Pulmonary:     Effort: Pulmonary effort is normal.  No respiratory distress.     Breath sounds: Normal breath sounds. No stridor. No wheezing.  Abdominal:     General: There is no distension.     Palpations: Abdomen is soft.     Tenderness: There is no abdominal tenderness.  Musculoskeletal:     Cervical back: Normal range of motion and neck supple. No bony tenderness.     Thoracic back: No bony tenderness.     Lumbar back: Tenderness and bony tenderness present.       Back:     Comments: Clavicles stable. Chest stable to AP/Lat compression. Pelvis stable to Lat compression. No obvious extremity deformity. No chest or abdominal wall contusion.  Skin:    General: Skin is warm and dry.     Findings: No erythema or rash.  Neurological:     Mental Status: She is alert and oriented to person, place, and time.     Comments: Moving all extremities     ED Results and Treatments Labs (all labs ordered are listed, but only abnormal results are displayed) Labs Reviewed  CBC WITH DIFFERENTIAL/PLATELET  COMPREHENSIVE METABOLIC PANEL                                                                                                                         EKG  EKG Interpretation  Date/Time:    Ventricular Rate:    PR Interval:    QRS Duration:   QT Interval:    QTC Calculation:   R Axis:     Text Interpretation:        Radiology No results found.  Pertinent labs & imaging results that were available during my care of the patient were  reviewed by me and considered in my medical decision making (see chart for details).  Medications Ordered in ED Medications  fentaNYL (SUBLIMAZE) injection 50 mcg (has no administration in time range)                                                                                                                                    Procedures Procedures  (including critical care time)  Medical Decision Making / ED Course I have reviewed the nursing notes for this encounter and the patient's prior records (if available in EHR or on provided paperwork).   [name] was evaluated in Emergency Department on 09/27/2020 for the symptoms described in the history of present  illness. She was evaluated in the context of the global COVID-19 pandemic, which necessitated consideration that the patient might be at risk for infection with the SARS-CoV-2 virus that causes COVID-19. Institutional protocols and algorithms that pertain to the evaluation of patients at risk for COVID-19 are in a state of rapid change based on information released by regulatory bodies including the CDC and federal and state organizations. These policies and algorithms were followed during the patient's care in the ED.  Level 2 trauma due to fall with head trauma on blood thinners. ABCs intact. Secondary as above.  Targeted trauma work-up obtained. Provided with pain medicine  Labs and imaging pending.  Patient care turned over to Dr Maryan Rued. Patient case and results discussed in detail; please see their note for further ED managment.          This chart was dictated using voice recognition software.  Despite best efforts to proofread,  errors can occur which can change the documentation meaning.   Fatima Blank, MD 09/27/20 [phone]

## 2020-09-27 NOTE — Progress Notes (Signed)
Orthopedic Tech Progress Note Patient Details:  [name] 10/24/1873 [id number] Level 2 Trauma. Ortho not needed Patient ID: [name], female   DOB: 10/24/1873, 86 y.o.   MRN: [id number]   [name] 09/27/2020, 6:14 AM

## 2020-09-27 NOTE — ED Notes (Addendum)
Ortho tech called and at bedside for TLSO brace education.

## 2020-09-27 NOTE — ED Notes (Signed)
Assumed care of patient. Patient in MRI. Will assess upon return to room.

## 2020-09-27 NOTE — ED Provider Notes (Signed)
Assumed care from Dr. Leonette Monarch at 7:30 AM.  Patient fell due to losing her balance and falling backwards and landing on her buttocks on the floor and hitting her head on the wall.  Patient is on Eliquis but major complaint is of back pain.  Patient's head CT is negative.  CT abdomen and pelvis shows subtle mild compression fracture deformity of the L1 vertebral body which appears acute given stranding and inflammation within the paravertebral soft tissues with associated buckle fracture deformity along the anterior cortex and probable nondisplaced fracture extension to the middle column suggesting an unstable vertebral body fracture.  MRI is recommended.  No other acute findings.  On reevaluation patient has normal plantar and dorsi flexion and sensation is intact in her legs.  Without movement pain is controlled but with any movement or attempt to elevate the bed patient reported she was having significant pain.  Discussed that I would like patient to remain semiflat and no significant movement at this time.  Will discuss with neurosurgery.  Patient's labs were within normal limits.  Covid is pending.  10:17 AM Spoke with Dr. Dory Larsen who recommended that patient be placed in a TLSO.  She will need pain control.  He reports because radiology recommended MRI we can go ahead and do that but he does not feel like the fracture is unstable.  When she is placed in a TLSO she will be cleared to do physical therapy but they will follow along with imaging and consult on the patient.   Blanchie Dessert, MD 09/27/20 1018

## 2020-09-27 NOTE — ED Notes (Signed)
Pt transported to MRI 

## 2020-09-27 NOTE — H&P (Signed)
History and Physical    [name] UXL:[id number] DOB: 11/22/1926 DOA: 09/27/2020  PCP: [name], DO; moved here from [facility] on 07/29/20  Consultants:  None Patient coming from:  Home - lives with daughter and her husband; NOK: Daughter, [phone]  Chief Complaint: fall  HPI: [name] is a 86 y.o. female with medical history significant of DVT/PE on Eliquis; HTN; and hypothyroidism presenting with a fall.  She was seen in the ER on 11/20 for abdominal pain and discharged.  Today, she got up to use the bedside commode and lost her balance when trying to get in bed again.  No light-headedness, SOB, dizziness at the time of fall.  She fell sideways over her side and then back.  She hit her head on the closet door.  Her back is the only thing hurting her.      ED Course:  On Eliquis, presented with a mechanical fall.  Landed on buttocks, hit head.  Head CT negative.  CT back L1 vertebral fracture - d/w NP Glenford Peers, MRI ordered, will need TLSO.  Neurosurgery to see.  If no surprises on MRI, can start PT with BLSO.  Review of Systems: As per HPI; otherwise review of systems reviewed and negative.   Ambulatory Status:  Ambulates with a cane or walker  COVID Vaccine Status: Complete  Past Medical History:  Diagnosis Date  . Central retinal artery occlusion of right eye 2015   lost complete vision  . Edema   . Hypertension    not on BP medication currently  . Neuropathy   . Post-surgical hypothyroidism   . VTE (venous thromboembolism) 12/2018   LLE DVT and PE    Past Surgical History:  Procedure Laterality Date  . REPLACEMENT TOTAL KNEE Right 2008  . TOTAL THYROIDECTOMY      Social History   Socioeconomic History  . Marital status: Unknown    Spouse name: Not on file  . Number of children: Not on file  . Years of education: Not on file  . Highest education level: Not on file  Occupational History  . Occupation: retired  Tobacco Use  . Smoking status: Never  Smoker  . Smokeless tobacco: Never Used  Substance and Sexual Activity  . Alcohol use: Never  . Drug use: Never  . Sexual activity: Not on file  Other Topics Concern  . Not on file  Social History Narrative  . Not on file   Social Determinants of Health   Financial Resource Strain:   . Difficulty of Paying Living Expenses: Not on file  Food Insecurity:   . Worried About Charity fundraiser in the Last Year: Not on file  . Ran Out of Food in the Last Year: Not on file  Transportation Needs:   . Lack of Transportation (Medical): Not on file  . Lack of Transportation (Non-Medical): Not on file  Physical Activity:   . Days of Exercise per Week: Not on file  . Minutes of Exercise per Session: Not on file  Stress:   . Feeling of Stress : Not on file  Social Connections:   . Frequency of Communication with Friends and Family: Not on file  . Frequency of Social Gatherings with Friends and Family: Not on file  . Attends Religious Services: Not on file  . Active Member of Clubs or Organizations: Not on file  . Attends Archivist Meetings: Not on file  . Marital Status: Not on file  Intimate Partner Violence:   .  Fear of Current or Ex-Partner: Not on file  . Emotionally Abused: Not on file  . Physically Abused: Not on file  . Sexually Abused: Not on file    Allergies  Allergen Reactions  . Codeine     History reviewed. No pertinent family history.  Prior to Admission medications   Not on File    Physical Exam: Vitals:   09/27/20 1415 09/27/20 1430 09/27/20 1445 09/27/20 1500  BP: (!) 141/66 128/63 130/78 (!) 159/72  Pulse: 71 72 72 74  Resp: (!) 21 (!) 29 (!) 22 (!) 28  Temp:      TempSrc:      SpO2: 93% 94% 95% 95%     . General:  Appears calm and comfortable and is NAD . Eyes:   normal L lid, iris; R eye with chronic visual loss . ENT:  Hard of hearing, cerumen in B ear canals which may be contributing, grossly normal  lips & tongue, mmm . Neck:  no  LAD, masses or thyromegaly . Cardiovascular:  RRR, no m/r/g. No LE edema.  Marland Kitchen Respiratory:   CTA bilaterally with no wheezes/rales/rhonchi.  Normal respiratory effort. . Abdomen:  soft, NT, ND, NABS . Skin:  no rash or induration seen on limited exam . Musculoskeletal:  grossly normal tone BUE/BLE, good ROM, no bony abnormality . Psychiatric:  grossly normal mood and affect, speech fluent and appropriate, AOx3 . Neurologic:  CN 2-12 grossly intact, moves all extremities in coordinated fashion    Radiological Exams on Admission: Independently reviewed - see discussion in A/P where applicable  CT ABDOMEN PELVIS WO CONTRAST  Result Date: 09/27/2020 CLINICAL DATA:  Fall, sacral pain, low back pain worse with movement. EXAM: CT ABDOMEN AND PELVIS WITHOUT CONTRAST TECHNIQUE: Multidetector CT imaging of the abdomen and pelvis was performed following the standard protocol without IV contrast. COMPARISON:  CT abdomen dated 09/12/2020 FINDINGS: Lower chest: Mild bibasilar scarring/atelectasis. Hepatobiliary: Small layering stones within the otherwise normal-appearing gallbladder. No focal liver abnormality is seen. No bile duct dilatation. Pancreas: Unremarkable. No pancreatic ductal dilatation or surrounding inflammatory changes. Spleen: Unremarkable Adrenals/Urinary Tract: Bilateral renal cysts better demonstrated on earlier contrast-enhanced CT. No renal stone or hydronephrosis bilaterally. No perinephric fluid. No ureteral or bladder calculi are identified. Bladder is unremarkable, partially decompressed. Stomach/Bowel: No dilated large or small bowel loops. No evidence of bowel wall inflammation or bowel injury. Diverticulosis of the sigmoid and descending colon but no focal inflammatory change to suggest acute diverticulitis. Stomach is unremarkable, partially decompressed. Vascular/Lymphatic: Aortic atherosclerosis. No enlarged lymph nodes are seen. Reproductive: Presumed hysterectomy.  No adnexal mass  or free fluid. Other: No free fluid or hemorrhage is seen within the abdomen or pelvis. No free intraperitoneal air. Musculoskeletal: Degenerative spondylosis of the thoracolumbar spine, moderate in degree, most pronounced degenerative hypertrophy within the posterior elements, with additional dystrophic/hypertrophic changes of the spinous processes of the lumbar spine and sacrum compatible with Baastrup's disease which can be a source of chronic low back pain. Additional advanced degenerative change at both hip joints. Prevertebral fluid stranding is present at the L1 vertebral body level. There is a subtle compression fracture deformity of the vertebral body which is most likely acute, with associated mild buckle fracture deformity along the anterior cortex, and probable nondisplaced fracture extension to the middle column to the RIGHT of midline. No convincing extension to the posterior elements. No associated vertebral body displacement/subluxation. IMPRESSION: 1. Subtle mild compression fracture deformity of the L1 vertebral body which is most  likely ACUTE given the associated fluid stranding/inflammation within the paravertebral soft tissues, with associated mild buckle fracture deformity along the anterior cortex and probable nondisplaced fracture extension to the middle column suggesting UNSTABLE vertebral body fracture. No associated vertebral body displacement/subluxation. Would consider lumbar spine MRI to confirm acuity and to more definitively characterize extent. 2. No acute findings within the abdomen or pelvis. No free fluid or hemorrhage. No evidence of bowel wall inflammation or bowel injury. No evidence of acute solid organ abnormality. No renal or ureteral calculi. 3. Colonic diverticulosis without evidence of acute diverticulitis. 4. Cholelithiasis without evidence of acute cholecystitis. 5. Chronic/degenerative changes of the lumbosacral spine, as detailed above. Aortic Atherosclerosis  (ICD10-I70.0). Electronically Signed   By: Franki Cabot M.D.   On: 09/27/2020 08:28   CT Head Wo Contrast  Result Date: 09/27/2020 CLINICAL DATA:  Fall, head trauma. EXAM: CT HEAD WITHOUT CONTRAST TECHNIQUE: Contiguous axial images were obtained from the base of the skull through the vertex without intravenous contrast. COMPARISON:  None. FINDINGS: Brain: Mild generalized age related parenchymal volume loss with commensurate dilatation of the ventricles and sulci. Mild chronic small vessel ischemic changes within the bilateral periventricular white matter regions. No mass, hemorrhage, edema or other evidence of acute parenchymal abnormality. No extra-axial hemorrhage. Vascular: Chronic calcified atherosclerotic changes of the large vessels at the skull base. No unexpected hyperdense vessel. Skull: Normal. Negative for fracture or focal lesion. Sinuses/Orbits: No acute finding. Other: None. IMPRESSION: 1. No acute findings. No intracranial mass, hemorrhage or edema. No skull fracture. 2. Mild chronic small vessel ischemic changes in the white matter. Electronically Signed   By: Franki Cabot M.D.   On: 09/27/2020 08:15   MR LUMBAR SPINE WO CONTRAST  Result Date: 09/27/2020 CLINICAL DATA:  Back trauma EXAM: MRI LUMBAR SPINE WITHOUT CONTRAST TECHNIQUE: Multiplanar, multisequence MR imaging of the lumbar spine was performed. No intravenous contrast was administered. COMPARISON:  CT abdomen 09/27/2020 FINDINGS: Segmentation:  Standard. Alignment:  Physiologic. Vertebrae: T12 vertebral body compression fracture with approximately 10% height loss. Intermediate signal material along the ventral epidural thecal sac extending from T12 to L2 which may reflect a small amount of epidural hemorrhage. Suggestion of possible T11 vertebral body compression fracture, but the vertebral body is only partially visualized on this examination. Well-circumscribed T1 hypointense and mildly T2 hyperintense 2 cm bone lesion in the L1  vertebral body. No discitis or osteomyelitis. Conus medullaris and cauda equina: Conus extends to the L1 level. Conus and cauda equina appear normal. Paraspinal and other soft tissues: No acute paraspinal abnormality. Disc levels: Disc spaces: Disc desiccation at L2-3, L3-4 and L4-5. T12-L1: No significant disc bulge. No evidence of neural foraminal stenosis. No central canal stenosis. L1-L2: No significant disc bulge. No evidence of neural foraminal stenosis. No central canal stenosis. Mild bilateral facet arthropathy. L2-L3: No significant disc bulge. No evidence of neural foraminal stenosis. No central canal stenosis. Mild bilateral facet arthropathy. L3-L4: Broad-based disc bulge. Severe bilateral facet arthropathy with ligamentum flavum infolding. Severe spinal stenosis. Mild right foraminal stenosis. No left foraminal stenosis. L4-L5: Broad-based disc bulge. Severe bilateral facet arthropathy. Severe spinal stenosis. Mild right foraminal stenosis. No left foraminal stenosis. L5-S1: No significant disc bulge. No evidence of neural foraminal stenosis. No central canal stenosis. Severe bilateral facet arthropathy. IMPRESSION: 1. Acute T12 vertebral body compression fracture with approximately 10% height loss and marrow edema throughout the vertebral body. Intermediate signal material along the ventral epidural thecal sac extending from T12 to L2 which may  reflect a small amount of epidural hemorrhage. 2. Suggestion of possible T11 vertebral body compression fracture, but the vertebral body is only partially visualized on this examination. 3. An indeterminate 2 cm L1 vertebral body bone lesion likely reflecting an atypical hemangioma. If there is further clinical concern, a follow-up MRI is recommended in 6 months to document stability. 4. At L3-4 there is a broad-based disc bulge. Severe bilateral facet arthropathy with ligamentum flavum infolding. Severe spinal stenosis. Mild right foraminal stenosis. 5. At L4-5  there is a broad-based disc bulge. Severe bilateral facet arthropathy. Severe spinal stenosis. At L5-S1 there is severe bilateral facet arthropathy. Electronically Signed   By: Kathreen Devoid   On: 09/27/2020 16:25    EKG: not done   Labs on Admission: I have personally reviewed the available labs and imaging studies at the time of the admission.  Pertinent labs:   Glucose 116 Albumin 3.3 Unremarkable CBC COVID/flu negative   Assessment/Plan Active Problems:   Fall at home, initial encounter   VTE (venous thromboembolism)   Post-surgical hypothyroidism   Neuropathy   Hypertension   Fall -Patient with a fall at home this AM -Mechanical fall, denies syncope/pre-syncope -She is on Eliquis, but no apparent head trauma and negative head CT -Apparent T12 compression fracture with possible small epidural hemorrhage -Neurosurgery is consulting -Pain management with Norco, morphine as needed -TLSO -PT/OT when cleared by neurosurgery -Will admit for ongoing pain control and therapy assessments  VTE -Patient with a reported single lifetime episode of DVT/PE -Records not currently available -Her daughter thinks this occurred >1 year ago -If records confirm >1 year with initial VTE event, it may be reasonable to d/c Eliquis -Regardless, holding Eliquis for now  Hypothyroidism -Check TSH -Continue Synthroid at current dose for now  Neuropathy -Continue Lyrica -Hold Ultram  HTN -She is not taking medications for this issue at this time -Hold Lasix - no significant edema on exam and she may not take as much PO intake as she would at home   Note: This patient has been tested and is negative for the novel coronavirus COVID-19. The patient has been fully vaccinated against COVID-19.    DVT prophylaxis: TED hose/SCDs Code Status:  DNR - confirmed with patient/family Family Communication: Daughter was present throughout evaluation. Disposition Plan:  The patient is from:  home  Anticipated d/c is to: home with [facility] services  Anticipated d/c date will depend on clinical response to treatment, but possibly 1-3 days depending on progress with PT/OT (once able to participate)  Patient is currently: acutely ill Consults called: Neurosurgery Admission status:  Admit - It is my clinical opinion that admission to INPATIENT is reasonable and necessary because of the expectation that this patient will require hospital care that crosses at least 2 midnights to treat this condition based on the medical complexity of the problems presented.  Given the aforementioned information, the predictability of an adverse outcome is felt to be significant.    Karmen Bongo MD [facility]   How to contact the [facility] Attending or Consulting provider [place] or covering provider during after hours [place], for this patient?  1. Check the care team in [facility] and look for a) attending/consulting [facility] provider listed and b) the [facility] team listed 2. Log into [email or web address] and use [place]'s universal password to access. If you do not have the password, please contact the hospital operator. 3. Locate the [facility] provider you are looking for under [facility] and page  to a number that you can be directly reached. 4. If you still have difficulty reaching the provider, please page the [facility] (Director on Call) for the Hospitalists listed on amion for assistance.   09/27/2020, 4:37 PM

## 2020-09-28 DIAGNOSIS — R5381 Other malaise: Secondary | ICD-10-CM

## 2020-09-28 DIAGNOSIS — M4800 Spinal stenosis, site unspecified: Secondary | ICD-10-CM

## 2020-09-28 DIAGNOSIS — S22080A Wedge compression fracture of T11-T12 vertebra, initial encounter for closed fracture: Secondary | ICD-10-CM

## 2020-09-28 DIAGNOSIS — M47817 Spondylosis without myelopathy or radiculopathy, lumbosacral region: Secondary | ICD-10-CM

## 2020-09-28 DIAGNOSIS — Z66 Do not resuscitate: Secondary | ICD-10-CM

## 2020-09-28 LAB — CBC
HCT: 34.3 % — ABNORMAL LOW (ref 36.0–46.0)
Hemoglobin: 11.9 g/dL — ABNORMAL LOW (ref 12.0–15.0)
MCH: 29.1 pg (ref 26.0–34.0)
MCHC: 34.7 g/dL (ref 30.0–36.0)
MCV: 83.9 fL (ref 80.0–100.0)
Platelets: 141 10*3/uL — ABNORMAL LOW (ref 150–400)
RBC: 4.09 MIL/uL (ref 3.87–5.11)
RDW: 14.4 % (ref 11.5–15.5)
WBC: 5.9 10*3/uL (ref 4.0–10.5)
nRBC: 0 % (ref 0.0–0.2)

## 2020-09-28 LAB — TSH: TSH: 5.341 u[IU]/mL — ABNORMAL HIGH (ref 0.350–4.500)

## 2020-09-28 LAB — BASIC METABOLIC PANEL
Anion gap: 8 (ref 5–15)
BUN: 10 mg/dL (ref 8–23)
CO2: 25 mmol/L (ref 22–32)
Calcium: 8.8 mg/dL — ABNORMAL LOW (ref 8.9–10.3)
Chloride: 107 mmol/L (ref 98–111)
Creatinine, Ser: 0.68 mg/dL (ref 0.44–1.00)
GFR, Estimated: >60 mL/min (ref >60)
Glucose, Bld: 101 mg/dL — ABNORMAL HIGH (ref 70–99)
Potassium: 3.9 mmol/L (ref 3.5–5.1)
Sodium: 140 mmol/L (ref 135–145)

## 2020-09-28 MED ORDER — OXYCODONE HCL 5 MG PO TABS
5.0000 mg | ORAL_TABLET | Freq: Four times a day (QID) | ORAL | Status: DC | PRN
  Administered 2020-09-28 – 2020-09-29 (×2): 5 mg via ORAL
  Filled 2020-09-28 (×3): qty 1

## 2020-09-28 MED ORDER — MORPHINE SULFATE (PF) 2 MG/ML IV SOLN
2.0000 mg | INTRAVENOUS | Status: DC | PRN
  Administered 2020-10-01 – 2020-10-02 (×3): 2 mg via INTRAVENOUS
  Filled 2020-09-28 (×3): qty 1

## 2020-09-28 MED ORDER — ACETAMINOPHEN 500 MG PO TABS
1000.0000 mg | ORAL_TABLET | Freq: Three times a day (TID) | ORAL | Status: DC
  Administered 2020-09-28 – 2020-10-05 (×21): 1000 mg via ORAL
  Filled 2020-09-28 (×21): qty 2

## 2020-09-28 NOTE — Progress Notes (Signed)
PROGRESS NOTE  [name] TKP:[id number] DOB: [birth year]   PCP: [name], DO  Patient is from: Home.  Lives with daughter.  Uses walker at baseline.  DOA: 09/27/2020 LOS: 1  Chief complaints: Fall.  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  In ED, slightly hypertensive.  Labs without significant finding.  CT head without acute finding.  CT abdomen and pelvis raise concern for mild L1 compression fracture.  Neurosurgery consulted and recommended lumbar MRI.  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  Neurosurgery recommended pain control, TLSO brace, PT/OT and outpatient follow-up in 4 weeks.   Subjective: Seen and examined earlier this morning.  No major events overnight or this morning.  Feels somewhat groggy after pain medication.  Reports 4/10 pain in her back.  Also reports numbness in both feet.  Denies urinary retention or bowel accidents.  Denies new focal weakness in her legs.  Patient's daughter at bedside.  Objective: Vitals:   09/27/20 2123 09/28/20 0200 09/28/20 0435 09/28/20 1245  BP: (!) 147/63 122/62 122/64 127/63  Pulse: 78 72 71 66  Resp: 17 17 17 16   Temp: 99.5 F (37.5 C) 98.9 F (37.2 C) 98.6 F (37 C) 98 F (36.7 C)  TempSrc: Oral Oral Oral Oral  SpO2: 97% 97% 96% 98%  Weight:      Height:        Intake/Output Summary (Last 24 hours) at 09/28/2020 1344 Last data filed at 09/28/2020 0300 Gross per 24 hour  Intake --  Output 400 ml  Net -400 ml   Filed Weights   09/27/20 2100  Weight: 93.5 kg    Examination:  GENERAL: No apparent distress.  Nontoxic. HEENT: MMM.  Vision and  hearing grossly intact.  NECK: Supple.  No apparent JVD.  RESP:  No IWOB.  Fair aeration bilaterally. CVS:  RRR. Heart sounds normal.  ABD/GI/GU: BS+. Abd soft, NTND.  MSK/EXT:  Moves extremities. No apparent deformity. No edema.  SKIN: no apparent skin lesion or wound NEURO: Sleepy but wakes to voice.  Somewhat groggy but fairly oriented.  No apparent focal neuro deficit but BLE weakness, 4/5 with hip flexion and dorsiflexion at ankles. PSYCH: Calm. Normal affect.  Procedures:  None  Microbiology summarized: [id number]-17 and influenza PCR nonreactive.  Assessment & Plan: Accidental fall at home-no syncope or presyncope.  He had a headache but no LOS.  CTH negative.  T12 compression fracture with about 10% height loss Possible T11 compression fracture Possible small epidural hemorrhage -Appreciate neurosurgery recommendation-pain control, TLSO brace, PT/OT and outpatient f/up in 4 weeks -Pain control with scheduled Tylenol, as needed oxycodone and as needed morphine based on pain severity -Continue holding Eliquis in the setting of possible epidural hemorrhage -SCD to RLE for VTE prophylaxis -Bowel regimen -Start diet.  Neuropathy/possible radiculopathy Severe facet arthropathy at L3-4, L4-5 and L5-S1 Broad disc bulge with severe canal stenosis at L3-4 and L4-5 -No signs of cauda equina or red flags. -Continue Lyrica and other pain meds as above -Outpatient follow-up with neurology -Hold Ultram   History of LLE DVT/PE: Seems to be unprovoked.  Has been on Eliquis for about a year. -Eliquis on hold due to possible  hemorrhage -May resume when 3 to 4 days.  Hypothyroidism: TSH 5.341. -Continue home Synthroid  Essential hypertension: Normotensive.  Only on Lasix at home. -Continue holding Lasix -As needed hydralazine  Debility/physical deconditioning: Uses walker at baseline. -PT/OT eval  Thrombocytopenia: Platelet 141.  Stable. -Continue monitoring  Body mass index  is 36.51 kg/m.         DVT prophylaxis:  SCDs Start: 09/27/20 1205 Place TED hose Start: 09/27/20 1205  Code Status: DNR/DNI Family Communication: Updated patient's daughter at bedside Status is: Inpatient  Remains inpatient appropriate because:Ongoing active pain requiring inpatient pain management, Unsafe d/c plan, IV treatments appropriate due to intensity of illness or inability to take PO and Inpatient level of care appropriate due to severity of illness   Dispo: The patient is from: Home              Anticipated d/c is to: To be determined after therapy evaluation              Anticipated d/c date is: 2 days              Patient currently is not medically stable to d/c.       Consultants:  Neurosurgery-signed off   Sch Meds:  Scheduled Meds: . acetaminophen  1,000 mg Oral Q8H  . docusate sodium  100 mg Oral BID  . levothyroxine  75 mcg Oral QAC breakfast  . pregabalin  75 mg Oral BID   Continuous Infusions: PRN Meds:.bisacodyl, hydrALAZINE, morphine injection, ondansetron **OR** ondansetron (ZOFRAN) IV, oxyCODONE, polyethylene glycol  Antimicrobials: Anti-infectives (From admission, onward)   None       I have personally reviewed the following labs and images: CBC: Recent Labs  Lab 09/27/20 0621 09/28/20 0342  WBC 5.7 5.9  NEUTROABS 3.2  --   HGB 12.5 11.9*  HCT 37.5 34.3*  MCV 86.0 83.9  PLT 149* 141*   BMP &GFR Recent Labs  Lab 09/27/20 0621 09/28/20 0342  NA 141 140  K 3.7 3.9  CL 108 107  CO2 24 25  GLUCOSE 116* 101*  BUN 13 10  CREATININE 0.77 0.68  CALCIUM 9.2 8.8*   Estimated Creatinine Clearance: 49.7 mL/min (by C-G formula based on SCr of 0.68 mg/dL). Liver & Pancreas: Recent Labs  Lab 09/27/20 0621  AST 20  ALT 16  ALKPHOS 93  BILITOT 0.5  PROT 6.5  ALBUMIN 3.3*   No results for input(s): LIPASE, AMYLASE in the last 168 hours. No results for input(s): AMMONIA in the last 168 hours. Diabetic: No results for  input(s): HGBA1C in the last 72 hours. No results for input(s): GLUCAP in the last 168 hours. Cardiac Enzymes: No results for input(s): CKTOTAL, CKMB, CKMBINDEX, TROPONINI in the last 168 hours. No results for input(s): PROBNP in the last 8760 hours. Coagulation Profile: No results for input(s): INR, PROTIME in the last 168 hours. Thyroid Function Tests: Recent Labs    09/28/20 0342  TSH 5.341*   Lipid Profile: No results for input(s): CHOL, HDL, LDLCALC, TRIG, CHOLHDL, LDLDIRECT in the last 72 hours. Anemia Panel: No results for input(s): VITAMINB12, FOLATE, FERRITIN, TIBC, IRON, RETICCTPCT in the last 72 hours. Urine analysis: No results found for: COLORURINE, APPEARANCEUR, LABSPEC, PHURINE, GLUCOSEU, HGBUR, BILIRUBINUR, KETONESUR, PROTEINUR, UROBILINOGEN, NITRITE, LEUKOCYTESUR Sepsis Labs: Invalid input(s): PROCALCITONIN, [place]  Microbiology: Recent Results (from the past 240 hour(s))  Resp Panel by RT-PCR (Flu A&B, Covid) Nasopharyngeal Swab     Status: None   Collection Time: 09/27/20  1:54 PM   Specimen: Nasopharyngeal Swab; Nasopharyngeal(NP) swabs in vial transport medium  Result Value Ref Range Status   SARS Coronavirus 2 by RT PCR NEGATIVE NEGATIVE Final    Comment: (NOTE) SARS-CoV-2 target nucleic acids are NOT DETECTED.  The SARS-CoV-2 RNA is generally detectable in upper respiratory specimens during the acute phase of infection. The lowest concentration of SARS-CoV-2 viral copies this assay can detect is 138 copies/mL. A negative result does not preclude SARS-Cov-2 infection and should not be used as the sole basis for treatment or other patient management decisions. A negative result may occur with  improper specimen collection/handling, submission of specimen other than nasopharyngeal swab, presence of viral mutation(s) within the areas targeted by this assay, and inadequate number of viral copies(<138 copies/mL). A negative result must be combined  with clinical observations, patient history, and epidemiological information. The expected result is Negative.  Fact Sheet for Patients:  EntrepreneurPulse.com.au  Fact Sheet for Healthcare Providers:  IncredibleEmployment.be  This test is no t yet approved or cleared by the [place] FDA and  has been authorized for detection and/or diagnosis of SARS-CoV-2 by FDA under an Emergency Use Authorization (EUA). This EUA will remain  in effect (meaning this test can be used) for the duration of the COVID-19 declaration under Section 564(b)(1) of the Act, 21 U.S.C.section 360bbb-3(b)(1), unless the authorization is terminated  or revoked sooner.       Influenza A by PCR NEGATIVE NEGATIVE Final   Influenza B by PCR NEGATIVE NEGATIVE Final    Comment: (NOTE) The Xpert Xpress SARS-CoV-2/FLU/RSV plus assay is intended as an aid in the diagnosis of influenza from Nasopharyngeal swab specimens and should not be used as a sole basis for treatment. Nasal washings and aspirates are unacceptable for Xpert Xpress SARS-CoV-2/FLU/RSV testing.  Fact Sheet for Patients: EntrepreneurPulse.com.au  Fact Sheet for Healthcare Providers: IncredibleEmployment.be  This test is not yet approved or cleared by the [place] FDA and has been authorized for detection and/or diagnosis of SARS-CoV-2 by FDA under an Emergency Use Authorization (EUA). This EUA will remain in effect (meaning this test can be used) for the duration of the COVID-19 declaration under Section 564(b)(1) of the Act, 21 U.S.C. section 360bbb-3(b)(1), unless the authorization is terminated or revoked.  Performed at [facility], [place] [address], [place], [place] [address]     Radiology Studies: MR LUMBAR SPINE WO CONTRAST  Result Date: 09/27/2020 CLINICAL DATA:  Back trauma EXAM: MRI LUMBAR SPINE WITHOUT CONTRAST TECHNIQUE: Multiplanar,  multisequence MR imaging of the lumbar spine was performed. No intravenous contrast was administered. COMPARISON:  CT abdomen 09/27/2020 FINDINGS: Segmentation:  Standard. Alignment:  Physiologic. Vertebrae: T12 vertebral body compression fracture with approximately 10% height loss. Intermediate signal material along the ventral epidural thecal sac extending from T12 to L2 which may reflect a small amount of epidural hemorrhage. Suggestion of possible T11 vertebral body compression fracture, but the vertebral body is only partially visualized on this examination. Well-circumscribed T1 hypointense and mildly T2 hyperintense 2 cm bone lesion in the L1 vertebral body. No discitis or osteomyelitis. Conus medullaris and cauda equina: Conus extends to the L1 level. Conus and cauda equina appear normal. Paraspinal and other soft tissues: No acute paraspinal abnormality. Disc levels: Disc spaces: Disc desiccation at L2-3, L3-4 and L4-5. T12-L1: No significant disc bulge. No evidence of neural foraminal stenosis. No central canal stenosis. L1-L2: No significant disc bulge. No evidence of neural foraminal stenosis. No central canal stenosis. Mild bilateral  facet arthropathy. L2-L3: No significant disc bulge. No evidence of neural foraminal stenosis. No central canal stenosis. Mild bilateral facet arthropathy. L3-L4: Broad-based disc bulge. Severe bilateral facet arthropathy with ligamentum flavum infolding. Severe spinal stenosis. Mild right foraminal stenosis. No left foraminal stenosis. L4-L5: Broad-based disc bulge. Severe bilateral facet arthropathy. Severe spinal stenosis. Mild right foraminal stenosis. No left foraminal stenosis. L5-S1: No significant disc bulge. No evidence of neural foraminal stenosis. No central canal stenosis. Severe bilateral facet arthropathy. IMPRESSION: 1. Acute T12 vertebral body compression fracture with approximately 10% height loss and marrow edema throughout the vertebral body. Intermediate  signal material along the ventral epidural thecal sac extending from T12 to L2 which may reflect a small amount of epidural hemorrhage. 2. Suggestion of possible T11 vertebral body compression fracture, but the vertebral body is only partially visualized on this examination. 3. An indeterminate 2 cm L1 vertebral body bone lesion likely reflecting an atypical hemangioma. If there is further clinical concern, a follow-up MRI is recommended in 6 months to document stability. 4. At L3-4 there is a broad-based disc bulge. Severe bilateral facet arthropathy with ligamentum flavum infolding. Severe spinal stenosis. Mild right foraminal stenosis. 5. At L4-5 there is a broad-based disc bulge. Severe bilateral facet arthropathy. Severe spinal stenosis. At L5-S1 there is severe bilateral facet arthropathy. Electronically Signed   By: Kathreen Devoid   On: 09/27/2020 16:25     Ajah Vanhoose T. Mucarabones  If 7PM-7AM, please contact night-coverage [email or web address] 09/28/2020, 1:44 PM

## 2020-09-28 NOTE — Progress Notes (Signed)
Subjective: Patient reports that she is doing well. Does have complaints of mid back pain.   Objective: Vital signs in last 24 hours: Temp:  [98 F (36.7 C)-99.5 F (37.5 C)] 98.6 F (37 C) (12/06 0435) Pulse Rate:  [69-144] 71 (12/06 0435) Resp:  [12-31] 17 (12/06 0435) BP: (122-182)/(52-85) 122/64 (12/06 0435) SpO2:  [84 %-99 %] 96 % (12/06 0435) Weight:  [93.5 kg] 93.5 kg (12/05 2100)  Intake/Output from previous day: 12/05 0701 - 12/06 0700 In: -  Out: 400 [Urine:400] Intake/Output this shift: No intake/output data recorded.  Physical Exam: Constitutional:      General: She is not in acute distress.    Appearance: She is well-developed. She is not diaphoretic.   Cardiovascular:     Rate and Rhythm: Normal rate and regular rhythm.     Heart sounds: Normal heart sounds. No murmur heard.   Pulmonary:     Effort: Pulmonary effort is normal. No respiratory distress.     Breath sounds: Normal breath sounds. No stridor. No wheezing.  Musculoskeletal:     Cervical back: Normal range of motion and neck supple. No bony tenderness.     Thoracic back: No bony tenderness.     Lumbar back: Tenderness and bony tenderness present.  Neurological:     Mental Status: She is alert and oriented to person, place, and time.     Comments: Moving all extremities  Lab Results: Recent Labs    09/27/20 0621 09/28/20 0342  WBC 5.7 5.9  HGB 12.5 11.9*  HCT 37.5 34.3*  PLT 149* 141*   BMET Recent Labs    09/27/20 0621 09/28/20 0342  NA 141 140  K 3.7 3.9  CL 108 107  CO2 24 25  GLUCOSE 116* 101*  BUN 13 10  CREATININE 0.77 0.68  CALCIUM 9.2 8.8*    Studies/Results: CT ABDOMEN PELVIS WO CONTRAST  Result Date: 09/27/2020 CLINICAL DATA:  Fall, sacral pain, low back pain worse with movement. EXAM: CT ABDOMEN AND PELVIS WITHOUT CONTRAST TECHNIQUE: Multidetector CT imaging of the abdomen and pelvis was performed following the standard protocol without IV contrast. COMPARISON:  CT  abdomen dated 09/12/2020 FINDINGS: Lower chest: Mild bibasilar scarring/atelectasis. Hepatobiliary: Small layering stones within the otherwise normal-appearing gallbladder. No focal liver abnormality is seen. No bile duct dilatation. Pancreas: Unremarkable. No pancreatic ductal dilatation or surrounding inflammatory changes. Spleen: Unremarkable Adrenals/Urinary Tract: Bilateral renal cysts better demonstrated on earlier contrast-enhanced CT. No renal stone or hydronephrosis bilaterally. No perinephric fluid. No ureteral or bladder calculi are identified. Bladder is unremarkable, partially decompressed. Stomach/Bowel: No dilated large or small bowel loops. No evidence of bowel wall inflammation or bowel injury. Diverticulosis of the sigmoid and descending colon but no focal inflammatory change to suggest acute diverticulitis. Stomach is unremarkable, partially decompressed. Vascular/Lymphatic: Aortic atherosclerosis. No enlarged lymph nodes are seen. Reproductive: Presumed hysterectomy.  No adnexal mass or free fluid. Other: No free fluid or hemorrhage is seen within the abdomen or pelvis. No free intraperitoneal air. Musculoskeletal: Degenerative spondylosis of the thoracolumbar spine, moderate in degree, most pronounced degenerative hypertrophy within the posterior elements, with additional dystrophic/hypertrophic changes of the spinous processes of the lumbar spine and sacrum compatible with Baastrup's disease which can be a source of chronic low back pain. Additional advanced degenerative change at both hip joints. Prevertebral fluid stranding is present at the L1 vertebral body level. There is a subtle compression fracture deformity of the vertebral body which is most likely acute, with associated mild buckle  fracture deformity along the anterior cortex, and probable nondisplaced fracture extension to the middle column to the RIGHT of midline. No convincing extension to the posterior elements. No associated  vertebral body displacement/subluxation. IMPRESSION: 1. Subtle mild compression fracture deformity of the L1 vertebral body which is most likely ACUTE given the associated fluid stranding/inflammation within the paravertebral soft tissues, with associated mild buckle fracture deformity along the anterior cortex and probable nondisplaced fracture extension to the middle column suggesting UNSTABLE vertebral body fracture. No associated vertebral body displacement/subluxation. Would consider lumbar spine MRI to confirm acuity and to more definitively characterize extent. 2. No acute findings within the abdomen or pelvis. No free fluid or hemorrhage. No evidence of bowel wall inflammation or bowel injury. No evidence of acute solid organ abnormality. No renal or ureteral calculi. 3. Colonic diverticulosis without evidence of acute diverticulitis. 4. Cholelithiasis without evidence of acute cholecystitis. 5. Chronic/degenerative changes of the lumbosacral spine, as detailed above. Aortic Atherosclerosis (ICD10-I70.0). Electronically Signed   By: Franki Cabot M.D.   On: 09/27/2020 08:28   CT Head Wo Contrast  Result Date: 09/27/2020 CLINICAL DATA:  Fall, head trauma. EXAM: CT HEAD WITHOUT CONTRAST TECHNIQUE: Contiguous axial images were obtained from the base of the skull through the vertex without intravenous contrast. COMPARISON:  None. FINDINGS: Brain: Mild generalized age related parenchymal volume loss with commensurate dilatation of the ventricles and sulci. Mild chronic small vessel ischemic changes within the bilateral periventricular white matter regions. No mass, hemorrhage, edema or other evidence of acute parenchymal abnormality. No extra-axial hemorrhage. Vascular: Chronic calcified atherosclerotic changes of the large vessels at the skull base. No unexpected hyperdense vessel. Skull: Normal. Negative for fracture or focal lesion. Sinuses/Orbits: No acute finding. Other: None. IMPRESSION: 1. No acute  findings. No intracranial mass, hemorrhage or edema. No skull fracture. 2. Mild chronic small vessel ischemic changes in the white matter. Electronically Signed   By: Franki Cabot M.D.   On: 09/27/2020 08:15   MR LUMBAR SPINE WO CONTRAST  Result Date: 09/27/2020 CLINICAL DATA:  Back trauma EXAM: MRI LUMBAR SPINE WITHOUT CONTRAST TECHNIQUE: Multiplanar, multisequence MR imaging of the lumbar spine was performed. No intravenous contrast was administered. COMPARISON:  CT abdomen 09/27/2020 FINDINGS: Segmentation:  Standard. Alignment:  Physiologic. Vertebrae: T12 vertebral body compression fracture with approximately 10% height loss. Intermediate signal material along the ventral epidural thecal sac extending from T12 to L2 which may reflect a small amount of epidural hemorrhage. Suggestion of possible T11 vertebral body compression fracture, but the vertebral body is only partially visualized on this examination. Well-circumscribed T1 hypointense and mildly T2 hyperintense 2 cm bone lesion in the L1 vertebral body. No discitis or osteomyelitis. Conus medullaris and cauda equina: Conus extends to the L1 level. Conus and cauda equina appear normal. Paraspinal and other soft tissues: No acute paraspinal abnormality. Disc levels: Disc spaces: Disc desiccation at L2-3, L3-4 and L4-5. T12-L1: No significant disc bulge. No evidence of neural foraminal stenosis. No central canal stenosis. L1-L2: No significant disc bulge. No evidence of neural foraminal stenosis. No central canal stenosis. Mild bilateral facet arthropathy. L2-L3: No significant disc bulge. No evidence of neural foraminal stenosis. No central canal stenosis. Mild bilateral facet arthropathy. L3-L4: Broad-based disc bulge. Severe bilateral facet arthropathy with ligamentum flavum infolding. Severe spinal stenosis. Mild right foraminal stenosis. No left foraminal stenosis. L4-L5: Broad-based disc bulge. Severe bilateral facet arthropathy. Severe spinal  stenosis. Mild right foraminal stenosis. No left foraminal stenosis. L5-S1: No significant disc bulge. No evidence  of neural foraminal stenosis. No central canal stenosis. Severe bilateral facet arthropathy. IMPRESSION: 1. Acute T12 vertebral body compression fracture with approximately 10% height loss and marrow edema throughout the vertebral body. Intermediate signal material along the ventral epidural thecal sac extending from T12 to L2 which may reflect a small amount of epidural hemorrhage. 2. Suggestion of possible T11 vertebral body compression fracture, but the vertebral body is only partially visualized on this examination. 3. An indeterminate 2 cm L1 vertebral body bone lesion likely reflecting an atypical hemangioma. If there is further clinical concern, a follow-up MRI is recommended in 6 months to document stability. 4. At L3-4 there is a broad-based disc bulge. Severe bilateral facet arthropathy with ligamentum flavum infolding. Severe spinal stenosis. Mild right foraminal stenosis. 5. At L4-5 there is a broad-based disc bulge. Severe bilateral facet arthropathy. Severe spinal stenosis. At L5-S1 there is severe bilateral facet arthropathy. Electronically Signed   By: Kathreen Devoid   On: 09/27/2020 16:25    Assessment/Plan: 86 year old female with compression fracture of T12 vertebral body after fall. Patient is continuing to experience a significant amount of thoracolumbar pain. Maintain TLSO brace. Continue working on pain control. PT/OT evaluation. Will obtain A/P and lateral standing radiographs of her thoracic and lumbar spine. Once PT/OT has evaluated the patient, she is able to be discharged home from a neurosurgical perspective. She will need to follow up in the clinic in 4 weeks with A/P and lateral thoracolumbar radiographs. I appreciate Family Medicine's help in managing this patient.    LOS: 1 day     Marvis Moeller, DNP, NP-C 09/28/2020, 11:38 AM

## 2020-09-28 NOTE — Progress Notes (Signed)
Nutrition Brief Note  RD consulted for assessment of nutritional requirements and status ("nutritional goals").   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  Pt admitted with T12 compression fracture s/p fall.   Per neurosurgery notes, plan for non-operative management (TSLO brace).   Spoke with pt and daughter at bedside. Both confirm pt with great appetite. She typically consumes 3 meals and 2-3 snacks per day. She denies any weight loss. Pt consumed most of her breakfast this morning and is getting ready to eat lunch.   Pt denies any weight loss.   Nutrition-Focused physical exam completed. Findings are no fat depletion, no muscle depletion, and no edema.   Discussed importance of good meal intake to promote healing. Downgraded diet to dysphagia 3 per pt request secondary to lack of dentures.   Current diet order is dysphagia 3, patient is consuming approximately n/a% of meals at this time. Labs and medications reviewed.   No nutrition interventions warranted at this time. If nutrition issues arise, please consult RD.   Loistine Chance, RD, LDN, [place] Registered Dietitian II Certified Diabetes Care and Education Specialist Please refer to [facility] for RD and/or RD on-call/weekend/after hours pager

## 2020-09-29 ENCOUNTER — Inpatient Hospital Stay (HOSPITAL_COMMUNITY): Payer: Medicare Other

## 2020-09-29 DIAGNOSIS — E559 Vitamin D deficiency, unspecified: Secondary | ICD-10-CM

## 2020-09-29 DIAGNOSIS — R300 Dysuria: Secondary | ICD-10-CM

## 2020-09-29 LAB — CBC
HCT: 32.9 % — ABNORMAL LOW (ref 36.0–46.0)
Hemoglobin: 10.7 g/dL — ABNORMAL LOW (ref 12.0–15.0)
MCH: 27.9 pg (ref 26.0–34.0)
MCHC: 32.5 g/dL (ref 30.0–36.0)
MCV: 85.9 fL (ref 80.0–100.0)
Platelets: 129 10*3/uL — ABNORMAL LOW (ref 150–400)
RBC: 3.83 MIL/uL — ABNORMAL LOW (ref 3.87–5.11)
RDW: 14.2 % (ref 11.5–15.5)
WBC: 6 10*3/uL (ref 4.0–10.5)
nRBC: 0 % (ref 0.0–0.2)

## 2020-09-29 LAB — URINALYSIS, ROUTINE W REFLEX MICROSCOPIC
Bilirubin Urine: NEGATIVE
Glucose, UA: NEGATIVE mg/dL
Ketones, ur: NEGATIVE mg/dL
Leukocytes,Ua: NEGATIVE
Nitrite: NEGATIVE
Protein, ur: NEGATIVE mg/dL
Specific Gravity, Urine: 1.009 (ref 1.005–1.030)
pH: 5 (ref 5.0–8.0)

## 2020-09-29 LAB — VITAMIN D 25 HYDROXY (VIT D DEFICIENCY, FRACTURES): Vit D, 25-Hydroxy: 11.48 ng/mL — ABNORMAL LOW (ref 30–100)

## 2020-09-29 MED ORDER — SENNOSIDES-DOCUSATE SODIUM 8.6-50 MG PO TABS
1.0000 | ORAL_TABLET | Freq: Two times a day (BID) | ORAL | Status: DC | PRN
  Administered 2020-09-30: 1 via ORAL
  Filled 2020-09-29: qty 1

## 2020-09-29 MED ORDER — APIXABAN 5 MG PO TABS
5.0000 mg | ORAL_TABLET | Freq: Two times a day (BID) | ORAL | Status: DC
  Administered 2020-09-29 – 2020-10-05 (×12): 5 mg via ORAL
  Filled 2020-09-29 (×13): qty 1

## 2020-09-29 MED ORDER — POLYETHYLENE GLYCOL 3350 17 G PO PACK
17.0000 g | PACK | Freq: Two times a day (BID) | ORAL | Status: DC | PRN
  Administered 2020-09-30 – 2020-10-04 (×4): 17 g via ORAL
  Filled 2020-09-29 (×4): qty 1

## 2020-09-29 MED ORDER — VITAMIN D (ERGOCALCIFEROL) 1.25 MG (50000 UNIT) PO CAPS
50000.0000 [IU] | ORAL_CAPSULE | ORAL | Status: DC
  Filled 2020-09-29: qty 1

## 2020-09-29 MED ORDER — SODIUM CHLORIDE 0.9 % IV SOLN
1.0000 g | INTRAVENOUS | Status: DC
  Administered 2020-09-29 – 2020-10-02 (×4): 1 g via INTRAVENOUS
  Filled 2020-09-29 (×4): qty 10
  Filled 2020-09-29: qty 1

## 2020-09-29 NOTE — Progress Notes (Signed)
Subjective: Patient report sfeeling sore in her back due to laying in bed with little movement. She also has complaints of constipation.  Objective: Vital signs in last 24 hours: Temp:  [98 F (36.7 C)-99.1 F (37.3 C)] 99.1 F (37.3 C) (12/06 2224) Pulse Rate:  [66-78] 78 (12/06 2224) Resp:  [16-20] 20 (12/06 2224) BP: (127-145)/(63-66) 145/66 (12/06 2224) SpO2:  [98 %-99 %] 99 % (12/06 2224)  Intake/Output from previous day: 12/06 0701 - 12/07 0700 In: 460 [P.O.:460] Out: 1300 [Urine:1300] Intake/Output this shift: No intake/output data recorded.  Physical Exam: Constitutional:  General: She is not in acute distress. Appearance: She is well-developed. She is not diaphoretic.   Cardiovascular:  Rate and Rhythm: Normal rate and regular rhythm.  Heart sounds: Normal heart sounds. No murmur heard.  Pulmonary:  Effort: Pulmonary effort is normal. No respiratory distress.  Breath sounds: Normal breath sounds. No stridor. No wheezing.  Musculoskeletal:  Cervical back: Normal range of motion and neck supple. No bony tenderness.  Thoracic back: No bony tenderness.  Lumbar back: Tenderness and bony tenderness present.  Neurological:  Mental Status: She is alert and oriented to person, place, and time.  Comments: Moving all extremities  Lab Results: Recent Labs    09/28/20 0342 09/29/20 0504  WBC 5.9 6.0  HGB 11.9* 10.7*  HCT 34.3* 32.9*  PLT 141* 129*   BMET Recent Labs    09/27/20 0621 09/28/20 0342  NA 141 140  K 3.7 3.9  CL 108 107  CO2 24 25  GLUCOSE 116* 101*  BUN 13 10  CREATININE 0.77 0.68  CALCIUM 9.2 8.8*    Studies/Results: MR LUMBAR SPINE WO CONTRAST  Result Date: 09/27/2020 CLINICAL DATA:  Back trauma EXAM: MRI LUMBAR SPINE WITHOUT CONTRAST TECHNIQUE: Multiplanar, multisequence MR imaging of the lumbar spine was performed. No intravenous contrast was administered. COMPARISON:  CT abdomen 09/27/2020 FINDINGS:  Segmentation:  Standard. Alignment:  Physiologic. Vertebrae: T12 vertebral body compression fracture with approximately 10% height loss. Intermediate signal material along the ventral epidural thecal sac extending from T12 to L2 which may reflect a small amount of epidural hemorrhage. Suggestion of possible T11 vertebral body compression fracture, but the vertebral body is only partially visualized on this examination. Well-circumscribed T1 hypointense and mildly T2 hyperintense 2 cm bone lesion in the L1 vertebral body. No discitis or osteomyelitis. Conus medullaris and cauda equina: Conus extends to the L1 level. Conus and cauda equina appear normal. Paraspinal and other soft tissues: No acute paraspinal abnormality. Disc levels: Disc spaces: Disc desiccation at L2-3, L3-4 and L4-5. T12-L1: No significant disc bulge. No evidence of neural foraminal stenosis. No central canal stenosis. L1-L2: No significant disc bulge. No evidence of neural foraminal stenosis. No central canal stenosis. Mild bilateral facet arthropathy. L2-L3: No significant disc bulge. No evidence of neural foraminal stenosis. No central canal stenosis. Mild bilateral facet arthropathy. L3-L4: Broad-based disc bulge. Severe bilateral facet arthropathy with ligamentum flavum infolding. Severe spinal stenosis. Mild right foraminal stenosis. No left foraminal stenosis. L4-L5: Broad-based disc bulge. Severe bilateral facet arthropathy. Severe spinal stenosis. Mild right foraminal stenosis. No left foraminal stenosis. L5-S1: No significant disc bulge. No evidence of neural foraminal stenosis. No central canal stenosis. Severe bilateral facet arthropathy. IMPRESSION: 1. Acute T12 vertebral body compression fracture with approximately 10% height loss and marrow edema throughout the vertebral body. Intermediate signal material along the ventral epidural thecal sac extending from T12 to L2 which may reflect a small amount of epidural hemorrhage. 2.  Suggestion of possible T11 vertebral body compression fracture, but the vertebral body is only partially visualized on this examination. 3. An indeterminate 2 cm L1 vertebral body bone lesion likely reflecting an atypical hemangioma. If there is further clinical concern, a follow-up MRI is recommended in 6 months to document stability. 4. At L3-4 there is a broad-based disc bulge. Severe bilateral facet arthropathy with ligamentum flavum infolding. Severe spinal stenosis. Mild right foraminal stenosis. 5. At L4-5 there is a broad-based disc bulge. Severe bilateral facet arthropathy. Severe spinal stenosis. At L5-S1 there is severe bilateral facet arthropathy. Electronically Signed   By: Kathreen Devoid   On: 09/27/2020 16:25    Assessment/Plan: 86 year old female with compression fracture of T12 vertebral body after fall. She is doing well overall. Continues to have c/o thoracolumbar pain and soreness. She also has complaints of constipation. Work on bowel regimen. Ok to restart Eliquis today. Maintain TLSO brace. Continue working on pain control. PT/OT evaluation. A/P and lateral standing radiographs of her thoracic and lumbar spine today. Once PT/OT has evaluated the patient, she is able to be discharged home from a neurosurgical perspective. She will need to follow up in the clinic in 4 weeks with A/P and lateral thoracolumbar radiographs. I appreciate Family Medicine's help in managing this patient.    LOS: 2 days     Marvis Moeller, DNP, NP-C 09/29/2020, 8:03 AM

## 2020-09-29 NOTE — Progress Notes (Signed)
PROGRESS NOTE  [name] KCM:[id number] DOB: 1927-08-28   PCP: [name], DO  Patient is from: Home.  Lives with daughter.  Uses walker at baseline.  DOA: 09/27/2020 LOS: 2  Chief complaints: Fall.  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  In ED, slightly hypertensive.  Labs without significant finding.  CT head without acute finding.  CT abdomen and pelvis raise concern for mild L1 compression fracture.  Neurosurgery consulted and recommended lumbar MRI.  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  Neurosurgery recommended pain control, TLSO brace, PT/OT and outpatient follow-up in 4 weeks.   Subjective: Seen and examined this afternoon.  She complains of dysuria.  Initially dysuria but no urgency or frequency.  She also complains of bilateral low back pain.  She denies fever but feels cold in her feet.  She denies new numbness, tingling or weakness.  She has not had bowel movement in 3 days.  Denies chest pain or dyspnea.  Daughter at bedside.  Objective: Vitals:   09/28/20 0435 09/28/20 1245 09/28/20 2224 09/29/20 1314  BP: 122/64 127/63 (!) 145/66 126/62  Pulse: 71 66 78 82  Resp: 17 16 20    Temp: 98.6 F (37 C) 98 F (36.7 C) 99.1 F (37.3 C) 98.7 F (37.1 C)  TempSrc: Oral Oral Oral Oral  SpO2: 96% 98% 99% 100%  Weight:      Height:        Intake/Output Summary (Last 24 hours) at 09/29/2020 1520 Last data filed at 09/29/2020 1355 Gross per 24 hour  Intake 300 ml  Output 1750 ml  Net -1450 ml   Filed Weights   09/27/20 2100  Weight: 93.5 kg    Examination:  GENERAL: No apparent distress.   Nontoxic. HEENT: MMM.  Vision and hearing grossly intact.  NECK: Supple.  No apparent JVD.  RESP:  No IWOB.  Fair aeration bilaterally. CVS:  RRR. Heart sounds normal.  ABD/GI/GU: BS+. Abd soft, NTND.  MSK/EXT:  Moves extremities. No apparent deformity. No edema.  SKIN: no apparent skin lesion or wound NEURO: Awake, alert and oriented appropriately.  Motor 3+/5 in BLE.  Light sensation and reflexes intact. PSYCH: Calm. Normal affect.  Procedures:  None  Microbiology summarized: [id number] and influenza PCR nonreactive.  Assessment & Plan: Accidental fall at home-no syncope or presyncope.  He had a headache but no LOS.  CTH negative.  T12 compression fracture with about 10% height loss Possible T11 compression fracture Possible small epidural hemorrhage Vitamin D deficiency-vitamin D level 11.48. -Neurosurgery recs:pain control, TLSO brace, PT/OT and outpatient f/up in 4 weeks -Pain control with scheduled Tylenol, as needed oxycodone and as needed morphine based on pain severity -Okay to resume Eliquis per neurosurgery -P.o. vitamin D 50,000 units weekly.  Add calcium supplementation as well -Bowel regimen-scheduled Colace, as needed MiraLAX and Senokot-S -PT/OT eval  Neuropathy/possible radiculopathy Severe facet arthropathy at L3-4, L4-5 and L5-S1 Broad disc bulge with severe canal stenosis at L3-4 and L4-5 -No signs of cauda equina or red flags. -Continue Lyrica and other pain meds as above -Outpatient follow-up with neurosurgery  Dysuria: New onset.  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  Has been on Eliquis for about a year. -Okay to resume Eliquis per neurosurgery  Hypothyroidism: TSH 5.341. -Continue home Synthroid  Essential hypertension: Normotensive.  Only on Lasix at home. -Continue holding Lasix -As needed hydralazine  Normocytic anemia: Hgb down about 2 g  since admit.  Likely dilutional from IV fluid.  Denies GI bleed. -Check anemia panel -Monitor H&H  Debility/physical deconditioning: Uses walker at baseline. -PT/OT eval  Thrombocytopenia: Platelet 129.  Slightly drop.  -Continue monitoring  Body mass index is 36.51 kg/m.         DVT prophylaxis:   apixaban (ELIQUIS) tablet 5 mg  Code Status: DNR/DNI Family Communication: Updated patient's daughter at bedside Status is: Inpatient  Remains inpatient appropriate because:Ongoing active pain requiring inpatient pain management, Unsafe d/c plan, IV treatments appropriate due to intensity of illness or inability to take PO and Inpatient level of care appropriate due to severity of illness   Dispo: The patient is from: Home              Anticipated d/c is to: To be determined after therapy evaluation              Anticipated d/c date is: 2 days              Patient currently is not medically stable to d/c.       Consultants:  Neurosurgery-signed off   Sch Meds:  Scheduled Meds: . acetaminophen  1,000 mg Oral Q8H  . apixaban  5 mg Oral BID  . docusate sodium  100 mg Oral BID  . levothyroxine  75 mcg Oral QAC breakfast  . pregabalin  75 mg Oral BID  . Vitamin D (Ergocalciferol)  50,000 Units Oral Q7 days   Continuous Infusions: . cefTRIAXone (ROCEPHIN)  IV     PRN Meds:.hydrALAZINE, morphine injection, ondansetron **OR** ondansetron (ZOFRAN) IV, oxyCODONE, polyethylene glycol, senna-docusate  Antimicrobials: Anti-infectives (From admission, onward)   Start     Dose/Rate Route Frequency Ordered Stop   09/29/20 1600  cefTRIAXone (ROCEPHIN) 1 g in sodium chloride 0.9 % 100 mL IVPB        1 g 200 mL/hr over 30 Minutes Intravenous Every 24 hours 09/29/20 1503         I have personally reviewed the following labs and images: CBC: Recent Labs  Lab 09/27/20 0621 09/28/20 0342 09/29/20 0504  WBC 5.7 5.9 6.0  NEUTROABS 3.2  --   --   HGB 12.5 11.9* 10.7*  HCT  37.5 34.3* 32.9*  MCV 86.0 83.9 85.9  PLT 149* 141* 129*   BMP &GFR Recent Labs  Lab 09/27/20 0621 09/28/20 0342  NA 141 140  K 3.7 3.9  CL 108 107  CO2 24 25  GLUCOSE 116* 101*  BUN 13 10  CREATININE 0.77 0.68  CALCIUM 9.2 8.8*   Estimated Creatinine Clearance: 49.7 mL/min (by C-G formula based on SCr of 0.68 mg/dL). Liver & Pancreas: Recent Labs  Lab 09/27/20 0621  AST 20  ALT 16  ALKPHOS 93  BILITOT 0.5  PROT 6.5  ALBUMIN 3.3*   No results for input(s): LIPASE, AMYLASE in the last 168 hours. No results for input(s): AMMONIA in the last 168 hours. Diabetic: No results for input(s): HGBA1C in the last 72 hours. No results for input(s): GLUCAP in the last 168 hours. Cardiac Enzymes: No results for input(s): CKTOTAL, CKMB, CKMBINDEX, TROPONINI in the last  168 hours. No results for input(s): PROBNP in the last 8760 hours. Coagulation Profile: No results for input(s): INR, PROTIME in the last 168 hours. Thyroid Function Tests: Recent Labs    09/28/20 0342  TSH 5.341*   Lipid Profile: No results for input(s): CHOL, HDL, LDLCALC, TRIG, CHOLHDL, LDLDIRECT in the last 72 hours. Anemia Panel: No results for input(s): VITAMINB12, FOLATE, FERRITIN, TIBC, IRON, RETICCTPCT in the last 72 hours. Urine analysis: No results found for: COLORURINE, APPEARANCEUR, LABSPEC, PHURINE, GLUCOSEU, HGBUR, BILIRUBINUR, KETONESUR, PROTEINUR, UROBILINOGEN, NITRITE, LEUKOCYTESUR Sepsis Labs: Invalid input(s): PROCALCITONIN, [place]  Microbiology: Recent Results (from the past 240 hour(s))  Resp Panel by RT-PCR (Flu A&B, Covid) Nasopharyngeal Swab     Status: None   Collection Time: 09/27/20  1:54 PM   Specimen: Nasopharyngeal Swab; Nasopharyngeal(NP) swabs in vial transport medium  Result Value Ref Range Status   SARS Coronavirus 2 by RT PCR NEGATIVE NEGATIVE Final    Comment: (NOTE) SARS-CoV-2 target nucleic acids are NOT DETECTED.  The SARS-CoV-2 RNA is generally detectable  in upper respiratory specimens during the acute phase of infection. The lowest concentration of SARS-CoV-2 viral copies this assay can detect is 138 copies/mL. A negative result does not preclude SARS-Cov-2 infection and should not be used as the sole basis for treatment or other patient management decisions. A negative result may occur with  improper specimen collection/handling, submission of specimen other than nasopharyngeal swab, presence of viral mutation(s) within the areas targeted by this assay, and inadequate number of viral copies(<138 copies/mL). A negative result must be combined with clinical observations, patient history, and epidemiological information. The expected result is Negative.  Fact Sheet for Patients:  EntrepreneurPulse.com.au  Fact Sheet for Healthcare Providers:  IncredibleEmployment.be  This test is no t yet approved or cleared by the [place] FDA and  has been authorized for detection and/or diagnosis of SARS-CoV-2 by FDA under an Emergency Use Authorization (EUA). This EUA will remain  in effect (meaning this test can be used) for the duration of the COVID-19 declaration under Section 564(b)(1) of the Act, 21 U.S.C.section 360bbb-3(b)(1), unless the authorization is terminated  or revoked sooner.       Influenza A by PCR NEGATIVE NEGATIVE Final   Influenza B by PCR NEGATIVE NEGATIVE Final    Comment: (NOTE) The Xpert Xpress SARS-CoV-2/FLU/RSV plus assay is intended as an aid in the diagnosis of influenza from Nasopharyngeal swab specimens and should not be used as a sole basis for treatment. Nasal washings and aspirates are unacceptable for Xpert Xpress SARS-CoV-2/FLU/RSV testing.  Fact Sheet for Patients: EntrepreneurPulse.com.au  Fact Sheet for Healthcare Providers: IncredibleEmployment.be  This test is not yet approved or cleared by the [place] FDA and has  been authorized for detection and/or diagnosis of SARS-CoV-2 by FDA under an Emergency Use Authorization (EUA). This EUA will remain in effect (meaning this test can be used) for the duration of the COVID-19 declaration under Section 564(b)(1) of the Act, 21 U.S.C. section 360bbb-3(b)(1), unless the authorization is terminated or revoked.  Performed at [facility], [place] [address], [place], [place] [address]     Radiology Studies: DG Thoracic Spine 2 View  Result Date: 09/29/2020 CLINICAL DATA:  T12 vertebral body compression fracture. Low back pain. Recent fall. EXAM: THORACIC SPINE 2 VIEWS COMPARISON:  CT abdomen pelvis 09/27/2020 and lumbar spine MRI 09/27/2020 FINDINGS: Normal alignment at the cervicothoracic junction. Degenerative facet arthropathy in the visualized cervical spine. Mild vertebral height loss at T12 and similar  to the recent imaging. No clear evidence for additional thoracic vertebral body compression fractures. IMPRESSION: 1. Mild vertebral body height loss at T12 and compatible with known compression fracture. 2. No other definite thoracic vertebral body compression fractures. 3. Degenerative changes in the cervical spine. Electronically Signed   By: Markus Daft M.D.   On: 09/29/2020 09:58   DG Lumbar Spine 2-3 Views  Result Date: 09/29/2020 CLINICAL DATA:  Fall 3 days ago.  Low back pain EXAM: LUMBAR SPINE - 2-3 VIEW COMPARISON:  CT 09/27/2020.  MRI 09/27/2020. FINDINGS: Diffuse osteopenia. Moderate compression fracture at T12 appears progressed since prior study 2 days ago. Diffuse degenerative facet disease throughout the lumbar spine. No subluxation. IMPRESSION: Progressive moderate T12 compression fracture. Diffuse osteopenia. Diffuse degenerative facet disease. Electronically Signed   By: Rolm Baptise M.D.   On: 09/29/2020 09:58     Heriberto Stmartin T. Oakley  If 7PM-7AM, please contact night-coverage [email or web address] 09/29/2020, 3:20 PM

## 2020-09-30 LAB — RETICULOCYTES
Immature Retic Fract: 10.9 % (ref 2.3–15.9)
RBC.: 3.72 MIL/uL — ABNORMAL LOW (ref 3.87–5.11)
Retic Count, Absolute: 48.4 10*3/uL (ref 19.0–186.0)
Retic Ct Pct: 1.3 % (ref 0.4–3.1)

## 2020-09-30 LAB — CBC
HCT: 33.7 % — ABNORMAL LOW (ref 36.0–46.0)
Hemoglobin: 10.9 g/dL — ABNORMAL LOW (ref 12.0–15.0)
MCH: 27.6 pg (ref 26.0–34.0)
MCHC: 32.3 g/dL (ref 30.0–36.0)
MCV: 85.3 fL (ref 80.0–100.0)
Platelets: 125 10*3/uL — ABNORMAL LOW (ref 150–400)
RBC: 3.95 MIL/uL (ref 3.87–5.11)
RDW: 14 % (ref 11.5–15.5)
WBC: 5.1 10*3/uL (ref 4.0–10.5)
nRBC: 0 % (ref 0.0–0.2)

## 2020-09-30 LAB — IRON AND TIBC
Iron: 24 ug/dL — ABNORMAL LOW (ref 28–170)
Saturation Ratios: 10 % — ABNORMAL LOW (ref 10.4–31.8)
TIBC: 235 ug/dL — ABNORMAL LOW (ref 250–450)
UIBC: 211 ug/dL

## 2020-09-30 LAB — MAGNESIUM: Magnesium: 1.9 mg/dL (ref 1.7–2.4)

## 2020-09-30 LAB — RENAL FUNCTION PANEL
Albumin: 2.6 g/dL — ABNORMAL LOW (ref 3.5–5.0)
Anion gap: 7 (ref 5–15)
BUN: 10 mg/dL (ref 8–23)
CO2: 28 mmol/L (ref 22–32)
Calcium: 8.5 mg/dL — ABNORMAL LOW (ref 8.9–10.3)
Chloride: 104 mmol/L (ref 98–111)
Creatinine, Ser: 0.67 mg/dL (ref 0.44–1.00)
GFR, Estimated: >60 mL/min (ref >60)
Glucose, Bld: 110 mg/dL — ABNORMAL HIGH (ref 70–99)
Phosphorus: 2.9 mg/dL (ref 2.5–4.6)
Potassium: 3.9 mmol/L (ref 3.5–5.1)
Sodium: 139 mmol/L (ref 135–145)

## 2020-09-30 LAB — FERRITIN: Ferritin: 44 ng/mL (ref 11–307)

## 2020-09-30 LAB — FOLATE: Folate: 12 ng/mL (ref >5.9)

## 2020-09-30 LAB — VITAMIN B12: Vitamin B-12: 241 pg/mL (ref 180–914)

## 2020-09-30 MED ORDER — SENNOSIDES-DOCUSATE SODIUM 8.6-50 MG PO TABS
2.0000 | ORAL_TABLET | Freq: Two times a day (BID) | ORAL | Status: DC | PRN
  Administered 2020-10-01: 2 via ORAL
  Filled 2020-09-30: qty 2

## 2020-09-30 MED ORDER — TRAMADOL HCL 50 MG PO TABS
50.0000 mg | ORAL_TABLET | Freq: Three times a day (TID) | ORAL | Status: DC | PRN
  Administered 2020-09-30 – 2020-10-05 (×6): 50 mg via ORAL
  Filled 2020-09-30 (×6): qty 1

## 2020-09-30 MED ORDER — POLYVINYL ALCOHOL 1.4 % OP SOLN
1.0000 [drp] | OPHTHALMIC | Status: DC | PRN
  Administered 2020-09-30 – 2020-10-01 (×2): 1 [drp] via OPHTHALMIC
  Filled 2020-09-30: qty 15

## 2020-09-30 MED ORDER — MAGNESIUM CITRATE PO SOLN
1.0000 | Freq: Every day | ORAL | Status: DC | PRN
  Administered 2020-09-30 – 2020-10-01 (×2): 1 via ORAL
  Filled 2020-09-30 (×2): qty 296

## 2020-09-30 MED ORDER — METHOCARBAMOL 500 MG PO TABS
500.0000 mg | ORAL_TABLET | Freq: Three times a day (TID) | ORAL | Status: DC | PRN
  Administered 2020-10-01 (×2): 500 mg via ORAL
  Filled 2020-09-30 (×2): qty 1

## 2020-09-30 NOTE — TOC CAGE-AID Note (Signed)
Transition of Care [facility]) - CAGE-AID Screening   Patient Details  Name: [name] MRN: [id number] Date of Birth: [birth year]  [facility]) CM/SW Contact:    Emeterio Reeve, [place] Phone Number: 09/30/2020, 2:38 PM   Clinical Narrative:  CSW met with pt at bedside. CSW introduced self and explained role at the hospital.  Pt denies alcohol use. Pt denies substance use. Pt did not need any resources at this time.    CAGE-AID Screening:    Have You Ever Felt You Ought to Cut Down on Your Drinking or Drug Use?: No Have People Annoyed You By Critizing Your Drinking Or Drug Use?: No Have You Felt Bad Or Guilty About Your Drinking Or Drug Use?: No Have You Ever Had a Drink or Used Drugs First Thing In The Morning to Steady Your Nerves or to Get Rid of a Hangover?: No CAGE-AID Score: 0  Substance Abuse Education Offered: Yes     Blima Ledger, [place] Social Worker [phone]

## 2020-09-30 NOTE — Evaluation (Signed)
Occupational Therapy Evaluation Patient Details Name: [name] MRN: [id number] DOB: [birth year] Today's Date: 09/30/2020    History of Present Illness Pt is a 86 yo female s/p h/o DVT/PE on Eliquis, HTN, neuropathy, diastolic CHF and hypothyroidism brought to ED after what looks like an accidental fall. Lspine MRI: T12 compression fx, possible T11 fx and disc bulge at L3-4, L4-5. No surgery recommended, pain control with TLSO.   Clinical Impression   Pt PTA: Pt recently moved here to live with her daughter. Pt's daughter works from home. Pt reports independence with mobility and ADL tasks; IADL provided for pt. Pt currently modA +2 for sit to stands; minA+2 for stability when taking steps with RW and chair follow. Pt set-upA to maxA for ADL tasks. Pt very limited with LB ADl due to low back pain. Pt totalA to donn brace; pt able to independently tighten brace. Pt would greatly benefit from continued OT skilled services for ADL, mobility and to decrease pain. OT following acutely.    Follow Up Recommendations  Home health OT;Supervision/Assistance - 24 hour    Equipment Recommendations  None recommended by OT    Recommendations for Other Services       Precautions / Restrictions Precautions Precautions: Fall;Back Precaution Booklet Issued: No Precaution Comments: discussion of back precautions began Required Braces or Orthoses: Spinal Brace Spinal Brace: Thoracolumbosacral orthotic;Applied in sitting position Restrictions Weight Bearing Restrictions: No      Mobility Bed Mobility               General bed mobility comments: Sitting EOB with NT/mobility tech in room    Transfers Overall transfer level: Needs assistance Equipment used: Rolling walker (2 wheeled) Transfers: Sit to/from Stand Sit to Stand: Mod assist;+2 physical assistance         General transfer comment: ModA +2 overall for stability; minA +2 for mobility    Balance Overall balance  assessment: Needs assistance Sitting-balance support: Bilateral upper extremity supported;Feet supported Sitting balance-Leahy Scale: Poor Sitting balance - Comments: requires BUEs for sitting balance     Standing balance-Leahy Scale: Poor Standing balance comment: Assist with RW and external support for stability                           ADL either performed or assessed with clinical judgement   ADL Overall ADL's : Needs assistance/impaired Eating/Feeding: Set up;Sitting   Grooming: Set up;Sitting   Upper Body Bathing: Minimal assistance;Sitting   Lower Body Bathing: Maximal assistance;Sitting/lateral leans;Sit to/from stand   Upper Body Dressing : Minimal assistance;Sitting   Lower Body Dressing: Maximal assistance;Sitting/lateral leans;Sit to/from stand;Cueing for safety   Toilet Transfer: Minimal assistance;+2 for physical assistance;+2 for safety/equipment;Ambulation;BSC   Toileting- Clothing Manipulation and Hygiene: Moderate assistance;+2 for physical assistance;Cueing for safety       Functional mobility during ADLs: Minimal assistance;+2 for physical assistance;+2 for safety/equipment (minA +2 or ModA+1 for stability of RW and to take steps) General ADL Comments: Pt limited by pain, nausea, decreased ability to care for self, decreased strength.      Vision Baseline Vision/History: No visual deficits Patient Visual Report: No change from baseline Vision Assessment?: No apparent visual deficits     Perception     Praxis      Pertinent Vitals/Pain Pain Assessment: Faces Faces Pain Scale: Hurts even more Pain Location: low back Pain Descriptors / Indicators: Discomfort;Sharp;Sore Pain Intervention(s): Limited activity within patient's tolerance;Monitored during session;Premedicated before session;Repositioned  Hand Dominance Right   Extremity/Trunk Assessment Upper Extremity Assessment Upper Extremity Assessment: Generalized weakness    Lower Extremity Assessment Lower Extremity Assessment: Generalized weakness   Cervical / Trunk Assessment Cervical / Trunk Assessment: Normal   Communication Communication Communication: No difficulties   Cognition Arousal/Alertness: Awake/alert Behavior During Therapy: WFL for tasks assessed/performed;Flat affect Overall Cognitive Status: Difficult to assess                                 General Comments: Pt appeared to follow all commands with mutlimodal cues. Pt not very talkative. daughter very attentive to pt   General Comments  Pt's daughter in room    Exercises     Shoulder [place] expects to be discharged to:: Private residence Living Arrangements: Children Available Help at Discharge: Family;Available 24 hours/day Type of Home: House Home Access: Stairs to enter [employer] of Steps: 1   Home Layout: Able to live on main level with bedroom/bathroom     Bathroom Shower/Tub: Teacher, early years/pre: Standard (puts BSC over commode)     Home Equipment: Bedside commode;Tub bench;Grab bars - tub/shower;Grab bars - toilet;Walker - standard;Walker - 4 wheels   Additional Comments: not able to ask thoroughly about DME and steps into home      Prior Functioning/Environment Level of Independence: Needs assistance  Gait / Transfers Assistance Needed: mobility in home without assist ADL's / Homemaking Assistance Needed: Pt performing own ADL and IADLs provided for her.            OT Problem List: Decreased strength;Decreased activity tolerance;Impaired balance (sitting and/or standing);Decreased safety awareness;Impaired UE functional use;Pain;Decreased knowledge of use of DME or AE;Decreased knowledge of precautions      OT Treatment/Interventions: Self-care/ADL training;Therapeutic exercise;Energy conservation;DME and/or AE instruction;Therapeutic activities;Patient/family  education;Balance training    OT Goals(Current goals can be found in the care plan section) Acute Rehab OT Goals Patient Stated Goal: daughter reports that she wants to take pt home OT Goal Formulation: With patient/family Time For Goal Achievement: 10/14/20 Potential to Achieve Goals: Good ADL Goals Pt Will Perform Grooming: with min guard assist;standing Pt Will Perform Upper Body Dressing: with min guard assist;sitting Pt Will Perform Lower Body Dressing: with min assist;sitting/lateral leans;sit to/from stand;with adaptive equipment Pt Will Transfer to Toilet: with min guard assist;ambulating;bedside commode Pt Will Perform Toileting - Clothing Manipulation and hygiene: with min guard assist;sitting/lateral leans;sit to/from stand  OT Frequency: Min 2X/week   Barriers to D/C:            Co-evaluation              AM-PAC OT "6 Clicks" Daily Activity     Outcome Measure Help from another person eating meals?: A Little Help from another person taking care of personal grooming?: A Little Help from another person toileting, which includes using toliet, bedpan, or urinal?: A Lot Help from another person bathing (including washing, rinsing, drying)?: A Lot Help from another person to put on and taking off regular upper body clothing?: A Little Help from another person to put on and taking off regular lower body clothing?: A Lot 6 Click Score: 15   End of Session Equipment Utilized During Treatment: Gait belt;Rolling walker;Back brace Nurse Communication: Mobility status;Precautions  Activity Tolerance: Patient tolerated treatment well;Patient limited by pain Patient left: in chair;with call bell/phone within reach;with chair alarm set;with family/visitor  present  OT Visit Diagnosis: Unsteadiness on feet (R26.81);Muscle weakness (generalized) (M62.81);Pain Pain - part of body:  (back)                Time: 1243-1310 OT Time Calculation (min): 27 min Charges:  OT General  Charges $OT Visit: 1 Visit OT Evaluation $OT Eval Moderate Complexity: 1 Mod  Jefferey Pica, OTR/L Acute Rehabilitation Services Pager: [phone] Office: [phone]   Tommey Barret C 09/30/2020, 1:38 PM

## 2020-09-30 NOTE — Progress Notes (Signed)
PROGRESS NOTE  [name] JIR:[id number] DOB: [birth year]   PCP: [name], DO  Patient is from: Home.  Lives with daughter.  Uses walker at baseline.  DOA: 09/27/2020 LOS: 3  Chief complaints: Fall.  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  In ED, slightly hypertensive.  Labs without significant finding.  CT head without acute finding.  CT abdomen and pelvis raise concern for mild L1 compression fracture.  Neurosurgery consulted and recommended lumbar MRI.  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  Neurosurgery recommended pain control, TLSO brace, PT/OT and outpatient follow-up in 4 weeks.   Subjective: Seen and examined earlier this morning.  No major events overnight of this morning.  Reports constipation.  Pain fairly controlled.  She rates her pain 3/10 at the moment.  No new focal weakness, numbness or tingling.  Dysuria seems to have improved.  She has not gotten out of the bed yet.  Objective: Vitals:   09/28/20 2224 09/29/20 1314 09/29/20 2034 09/30/20 0318  BP: (!) 145/66 126/62 134/71 (!) 141/66  Pulse: 78 82 79 77  Resp: 20  16 17   Temp: 99.1 F (37.3 C) 98.7 F (37.1 C) 98.8 F (37.1 C) 98.5 F (36.9 C)  TempSrc: Oral Oral Oral   SpO2: 99% 100% 96% 99%  Weight:      Height:        Intake/Output Summary (Last 24 hours) at 09/30/2020 1319 Last data filed at 09/30/2020 1049 Gross per 24 hour  Intake 480 ml  Output 1400 ml  Net -920 ml   Filed Weights   09/27/20 2100  Weight: 93.5 kg    Examination:  GENERAL: No apparent distress.  Nontoxic. HEENT: MMM.  Hearing grossly intact.   Legally blind in right eye. NECK: Supple.  No apparent JVD.  RESP:  No IWOB.  Fair aeration bilaterally. CVS:  RRR. Heart sounds normal.  ABD/GI/GU: BS+. Abd soft, NTND.  MSK/EXT:  Moves extremities. No apparent deformity. No edema.  SKIN: no apparent skin lesion or wound NEURO: Awake, alert and oriented appropriately.  Motor 3+/5 in BLE.  Sensory and reflex grossly intact. PSYCH: Calm. Normal affect.  Procedures:  None  Microbiology summarized: [id number] and influenza PCR nonreactive.  Assessment & Plan: Accidental fall at home-no syncope or presyncope.  He had a headache but no LOS.  CTH negative.  T12 compression fracture with about 10% height loss Possible T11 compression fracture Possible small epidural hemorrhage Vitamin D deficiency-vitamin D level 11.48. -Neurosurgery recs:pain control, TLSO brace, PT/OT and outpatient f/up in 4 weeks -Pain control with scheduled Tylenol, as needed Robaxin, tramadol and IV morphine -Okay to resume Eliquis per neurosurgery as of 12/7. -P.o. vitamin D 50,000 units weekly.  Add calcium supplementation as well -Bowel regimen-scheduled Colace, as needed MiraLAX, Senokot-S and mag citrate -PT/OT eval  Neuropathy/possible radiculopathy Severe facet arthropathy at L3-4, L4-5 and L5-S1 Broad disc bulge with severe canal stenosis at L3-4 and L4-5 -No signs of cauda equina or red flags. -Continue Lyrica and other pain meds as above -Outpatient follow-up with neurosurgery in 4 weeks  Dysuria: New onset.  Associated new bilateral low back pain.  UA not suggestive for UTI.  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  Has been on Eliquis for about a year. -Continue home Eliquis  Hypothyroidism: TSH 5.341. -Continue home Synthroid  Essential hypertension: Normotensive.  Only on Lasix at home. -Continue holding Lasix -As needed hydralazine  Normocytic  anemia/iron deficiency anemia: Hgb down about 2 g since admit.  Likely dilutional from IV fluid.  Denies GI bleed.  Anemia panel shows some degree of iron deficiency. -P.o. ferrous sulfate with bowel regimen -Monitor H&H  Debility/physical deconditioning: Uses walker at baseline. -PT/OT eval  Thrombocytopenia: Platelet 129> 125.  Relatively stable. -Continue monitoring  Constipation: -Management as above.  Body mass index is 36.51 kg/m.         DVT prophylaxis:   apixaban (ELIQUIS) tablet 5 mg  Code Status: DNR/DNI Family Communication: Updated patient's daughter at bedside Status is: Inpatient  Remains inpatient appropriate because:Unsafe d/c plan, IV treatments appropriate due to intensity of illness or inability to take PO and Inpatient level of care appropriate due to severity of illness   Dispo: The patient is from: Home              Anticipated d/c is to: To be determined after therapy evaluation              Anticipated d/c date is: 1 day              Patient currently is not medically stable to d/c.       Consultants:  Neurosurgery-signed off   Sch Meds:  Scheduled Meds: . acetaminophen  1,000 mg Oral Q8H  . apixaban  5 mg Oral BID  . docusate sodium  100 mg Oral BID  . levothyroxine  75 mcg Oral QAC breakfast  . pregabalin  75 mg Oral BID  . Vitamin D (Ergocalciferol)  50,000 Units Oral Q7 days   Continuous Infusions: . cefTRIAXone (ROCEPHIN)  IV 1 g (09/29/20 1806)   PRN Meds:.hydrALAZINE, magnesium citrate, methocarbamol, morphine injection, ondansetron **OR** ondansetron (ZOFRAN) IV, polyethylene glycol, polyvinyl alcohol, senna-docusate, traMADol  Antimicrobials: Anti-infectives (From admission, onward)   Start     Dose/Rate Route Frequency Ordered Stop   09/29/20 1600  cefTRIAXone (ROCEPHIN) 1 g in sodium chloride 0.9 % 100 mL IVPB        1 g 200 mL/hr over 30 Minutes Intravenous Every 24 hours 09/29/20 1503         I have personally  reviewed the following labs and images: CBC: Recent Labs  Lab 09/27/20 0621 09/28/20 0342 09/29/20 0504 09/30/20 0054  WBC 5.7 5.9 6.0 5.1  NEUTROABS 3.2  --   --   --   HGB 12.5 11.9* 10.7* 10.9*  HCT 37.5 34.3* 32.9* 33.7*  MCV 86.0 83.9 85.9 85.3  PLT 149* 141* 129* 125*   BMP &GFR Recent Labs  Lab 09/27/20 0621 09/28/20 0342 09/30/20 0054  NA 141 140 139  K 3.7 3.9 3.9  CL 108 107 104  CO2 24 25 28   GLUCOSE 116* 101* 110*  BUN 13 10 10   CREATININE 0.77 0.68 0.67  CALCIUM 9.2 8.8* 8.5*  MG  --   --  1.9  PHOS  --   --  2.9   Estimated Creatinine Clearance: 49.7 mL/min (by C-G formula based on SCr of 0.67 mg/dL). Liver & Pancreas: Recent Labs  Lab 09/27/20 0621 09/30/20 0054  AST 20  --   ALT 16  --  ALKPHOS 93  --   BILITOT 0.5  --   PROT 6.5  --   ALBUMIN 3.3* 2.6*   No results for input(s): LIPASE, AMYLASE in the last 168 hours. No results for input(s): AMMONIA in the last 168 hours. Diabetic: No results for input(s): HGBA1C in the last 72 hours. No results for input(s): GLUCAP in the last 168 hours. Cardiac Enzymes: No results for input(s): CKTOTAL, CKMB, CKMBINDEX, TROPONINI in the last 168 hours. No results for input(s): PROBNP in the last 8760 hours. Coagulation Profile: No results for input(s): INR, PROTIME in the last 168 hours. Thyroid Function Tests: Recent Labs    09/28/20 0342  TSH 5.341*   Lipid Profile: No results for input(s): CHOL, HDL, LDLCALC, TRIG, CHOLHDL, LDLDIRECT in the last 72 hours. Anemia Panel: Recent Labs    09/30/20 0054  VITAMINB12 241  FOLATE 12.0  FERRITIN 44  TIBC 235*  IRON 24*  RETICCTPCT 1.3   Urine analysis:    Component Value Date/Time   COLORURINE YELLOW 09/29/2020 1502   APPEARANCEUR CLEAR 09/29/2020 1502   LABSPEC 1.009 09/29/2020 1502   PHURINE 5.0 09/29/2020 1502   GLUCOSEU NEGATIVE 09/29/2020 1502   HGBUR SMALL (A) 09/29/2020 1502   BILIRUBINUR NEGATIVE 09/29/2020 1502   KETONESUR  NEGATIVE 09/29/2020 1502   PROTEINUR NEGATIVE 09/29/2020 1502   NITRITE NEGATIVE 09/29/2020 1502   LEUKOCYTESUR NEGATIVE 09/29/2020 1502   Sepsis Labs: Invalid input(s): PROCALCITONIN, [place]  Microbiology: Recent Results (from the past 240 hour(s))  Resp Panel by RT-PCR (Flu A&B, Covid) Nasopharyngeal Swab     Status: None   Collection Time: 09/27/20  1:54 PM   Specimen: Nasopharyngeal Swab; Nasopharyngeal(NP) swabs in vial transport medium  Result Value Ref Range Status   SARS Coronavirus 2 by RT PCR NEGATIVE NEGATIVE Final    Comment: (NOTE) SARS-CoV-2 target nucleic acids are NOT DETECTED.  The SARS-CoV-2 RNA is generally detectable in upper respiratory specimens during the acute phase of infection. The lowest concentration of SARS-CoV-2 viral copies this assay can detect is 138 copies/mL. A negative result does not preclude SARS-Cov-2 infection and should not be used as the sole basis for treatment or other patient management decisions. A negative result may occur with  improper specimen collection/handling, submission of specimen other than nasopharyngeal swab, presence of viral mutation(s) within the areas targeted by this assay, and inadequate number of viral copies(<138 copies/mL). A negative result must be combined with clinical observations, patient history, and epidemiological information. The expected result is Negative.  Fact Sheet for Patients:  EntrepreneurPulse.com.au  Fact Sheet for Healthcare Providers:  IncredibleEmployment.be  This test is no t yet approved or cleared by the [place] FDA and  has been authorized for detection and/or diagnosis of SARS-CoV-2 by FDA under an Emergency Use Authorization (EUA). This EUA will remain  in effect (meaning this test can be used) for the duration of the COVID-19 declaration under Section 564(b)(1) of the Act, 21 U.S.C.section 360bbb-3(b)(1), unless the authorization is  terminated  or revoked sooner.       Influenza A by PCR NEGATIVE NEGATIVE Final   Influenza B by PCR NEGATIVE NEGATIVE Final    Comment: (NOTE) The Xpert Xpress SARS-CoV-2/FLU/RSV plus assay is intended as an aid in the diagnosis of influenza from Nasopharyngeal swab specimens and should not be used as a sole basis for treatment. Nasal washings and aspirates are unacceptable for Xpert Xpress SARS-CoV-2/FLU/RSV testing.  Fact Sheet for Patients: EntrepreneurPulse.com.au  Fact Sheet for Healthcare Providers: IncredibleEmployment.be  This test is not yet approved or cleared by the [place] and has been authorized for detection and/or diagnosis of SARS-CoV-2 by FDA under an Emergency Use Authorization (EUA). This EUA will remain in effect (meaning this test can be used) for the duration of the COVID-19 declaration under Section 564(b)(1) of the Act, 21 U.S.C. section 360bbb-3(b)(1), unless the authorization is terminated or revoked.  Performed at [facility], [place] [address] [place], [place] [address]     Radiology Studies: No results found.   Joeanthony Seeling T. Shelby  If 7PM-7AM, please contact night-coverage [email or web address] 09/30/2020, 1:19 PM

## 2020-09-30 NOTE — Progress Notes (Signed)
Subjective: Patient reports that she is upset with laying in bed for a long period of time and that breakfast is cold this morning. She continues to have c/o constipation.   Objective: Vital signs in last 24 hours: Temp:  [98.5 F (36.9 C)-98.8 F (37.1 C)] 98.5 F (36.9 C) (12/08 0318) Pulse Rate:  [77-82] 77 (12/08 0318) Resp:  [16-17] 17 (12/08 0318) BP: (126-141)/(62-71) 141/66 (12/08 0318) SpO2:  [96 %-100 %] 99 % (12/08 0318)  Intake/Output from previous day: 12/07 0701 - 12/08 0700 In: 720 [P.O.:720] Out: 1350 [Urine:1350] Intake/Output this shift: No intake/output data recorded.  Physical Exam: Constitutional:  General: She is not in acute distress. Cardiovascular:  Rate and Rhythm: Normal rate and regular rhythm.  Heart sounds: Normal heart sounds. No murmur heard.  Pulmonary:  Effort: Pulmonary effort is normal. No respiratory distress.  Breath sounds: Normal breath sounds. No stridor. No wheezing.  Musculoskeletal:  Cervical back: Normal range of motion and neck supple. No bony tenderness.  Thoracic back: No bony tenderness.  Lumbar back: Tenderness and bony tenderness present.  Neurological:  Mental Status: She is alert and oriented to person, place, and time.  Comments: Moving all extremities  Lab Results: Recent Labs    09/29/20 0504 09/30/20 0054  WBC 6.0 5.1  HGB 10.7* 10.9*  HCT 32.9* 33.7*  PLT 129* 125*   BMET Recent Labs    09/28/20 0342 09/30/20 0054  NA 140 139  K 3.9 3.9  CL 107 104  CO2 25 28  GLUCOSE 101* 110*  BUN 10 10  CREATININE 0.68 0.67  CALCIUM 8.8* 8.5*    Studies/Results: DG Thoracic Spine 2 View  Result Date: 09/29/2020 CLINICAL DATA:  T12 vertebral body compression fracture. Low back pain. Recent fall. EXAM: THORACIC SPINE 2 VIEWS COMPARISON:  CT abdomen pelvis 09/27/2020 and lumbar spine MRI 09/27/2020 FINDINGS: Normal alignment at the cervicothoracic junction. Degenerative facet  arthropathy in the visualized cervical spine. Mild vertebral height loss at T12 and similar to the recent imaging. No clear evidence for additional thoracic vertebral body compression fractures. IMPRESSION: 1. Mild vertebral body height loss at T12 and compatible with known compression fracture. 2. No other definite thoracic vertebral body compression fractures. 3. Degenerative changes in the cervical spine. Electronically Signed   By: Markus Daft M.D.   On: 09/29/2020 09:58   DG Lumbar Spine 2-3 Views  Result Date: 09/29/2020 CLINICAL DATA:  Fall 3 days ago.  Low back pain EXAM: LUMBAR SPINE - 2-3 VIEW COMPARISON:  CT 09/27/2020.  MRI 09/27/2020. FINDINGS: Diffuse osteopenia. Moderate compression fracture at T12 appears progressed since prior study 2 days ago. Diffuse degenerative facet disease throughout the lumbar spine. No subluxation. IMPRESSION: Progressive moderate T12 compression fracture. Diffuse osteopenia. Diffuse degenerative facet disease. Electronically Signed   By: Rolm Baptise M.D.   On: 09/29/2020 09:58    Assessment/Plan: 86 year old female with compression fracture of T12 vertebral body after fall. She is continuing to do well. Thoracolumbar pain and soreness persists. She also is cotnuingn to complain of constipation. Continue working on bowel regimen. Continue Eliquis. Maintain TLSO brace. Continue working on pain control. Awaiting PT/OT evaluation. A/P and lateral standing radiographs of her thoracic and lumbar spine completed yesterday. Radiographs revealed T12 compression fracture, osteopenia, and facet arthropathy throughout her lumbar spine. Once PT/OT has evaluated the patient, she is able to be discharged home from a neurosurgical perspective. She will need to follow up in the clinic in 4 weeks with A/P and lateral  thoracolumbar radiographs. I appreciate Family Medicine's help in managing this patient.   LOS: 3 days     Marvis Moeller, DNP, NP-C 09/30/2020, 9:00  AM

## 2020-09-30 NOTE — Care Management Important Message (Signed)
Important Message  Patient Details  Name: [name] MRN: [id number] Date of Birth: 1926/12/26   Medicare Important Message Given:  Yes     Mads Borgmeyer Montine Circle 09/30/2020, 3:46 PM

## 2020-09-30 NOTE — Evaluation (Signed)
Physical Therapy Evaluation Patient Details Name: [name] MRN: [id number] DOB: 1927/10/17 Today's Date: 09/30/2020   History of Present Illness  Pt is a 86 yo female s/p h/o DVT/PE on Eliquis, HTN, neuropathy, diastolic CHF and hypothyroidism brought to ED after what looks like an accidental fall. Lspine MRI: T12 compression fx, possible T11 fx and disc bulge at L3-4, L4-5. No surgery recommended, pain control with TLSO.  Clinical Impression   Pt admitted with above diagnosis. Comes from home where she lives with her daughter in a home with a few steps to enter; Walks household distances without difficulty with RW at baseline, and performs her own basic ADLs; Presents to PT with decr functional mobility, decr activity tolerance, and noted difficulty with TLSO fit (contacted Ortho Tech); Needing at least Mod assist for bed mobility, and Min/Mod assist for short distance walking with RW;  Pt's daughter, [name] hopes to be able to have her at home still, and I agree that home with familiar routine is the most therapeutic place; Pt currently with functional limitations due to the deficits listed below (see PT Problem List). Pt will benefit from skilled PT to increase their independence and safety with mobility to allow discharge to the venue listed below.       Follow Up Recommendations Home health PT;Supervision/Assistance - 24 hour    Equipment Recommendations  Rolling walker with 5" wheels;3in1 (PT);Other (comment) (will consisder WC and/or hospital bed over next few sessions)    Recommendations for Other Services Other (comment) (Contacted Ortho Tech to take a closer look at her TLSO)     Precautions / Restrictions Precautions Precautions: Fall;Back Precaution Comments: discussion of back precautions began Required Braces or Orthoses: Spinal Brace Spinal Brace: Thoracolumbosacral orthotic;Applied in sitting position      Mobility  Bed Mobility               General bed  mobility comments: Sitting EOB with NT/mobility tech in room; Mobility Tech indicated she needed cues for log roll, and needed a lot of help to push up to upright sitting    Transfers Overall transfer level: Needs assistance Equipment used: Rolling walker (2 wheeled) Transfers: Sit to/from Stand Sit to Stand: Mod assist;+2 physical assistance         General transfer comment: ModA +2 overall for stability; minA +2 for mobility  Ambulation/Gait Ambulation/Gait assistance: Min assist;+2 safety/equipment Gait Distance (Feet): 8 Feet Assistive device: Rolling walker (2 wheeled) Gait Pattern/deviations: Decreased step length - right;Decreased step length - left     General Gait Details: Short steps with heavy dependence on UE support on RW  Stairs            Wheelchair Mobility    Modified Rankin (Stroke Patients Only)       Balance     Sitting balance-Leahy Scale: Poor       Standing balance-Leahy Scale: Poor                               Pertinent Vitals/Pain Pain Assessment: Faces Faces Pain Scale: Hurts even more Pain Location: low back Pain Descriptors / Indicators: Discomfort;Sharp;Sore Pain Intervention(s): Monitored during session    Home Living Family/patient expects to be discharged to:: Private residence Living Arrangements: Children Available Help at Discharge: Family;Available 24 hours/day Type of Home: House Home Access: Stairs to enter   [employer] of Steps: 1 Home Layout: Able to live on main level with  bedroom/bathroom Home Equipment: Bedside commode;Tub bench;Grab bars - tub/shower;Grab bars - toilet;Walker - standard;Walker - 4 wheels Additional Comments: not able to ask thoroughly about DME and steps into home; will need to get more clarification    Prior Function Level of Independence: Needs assistance   Gait / Transfers Assistance Needed: mobility in home without assist  ADL's / Homemaking Assistance  Needed: Pt performing own ADL and IADLs provided for her.        Hand Dominance   Dominant Hand: Right    Extremity/Trunk Assessment   Upper Extremity Assessment Upper Extremity Assessment: Defer to OT evaluation    Lower Extremity Assessment Lower Extremity Assessment: Generalized weakness       Communication   Communication: No difficulties  Cognition Arousal/Alertness: Awake/alert Behavior During Therapy: WFL for tasks assessed/performed;Flat affect Overall Cognitive Status: Difficult to assess                                 General Comments: Pt appeared to follow all commands with mutlimodal cues. Pt not very talkative. daughter very attentive to pt      General Comments General comments (skin integrity, edema, etc.): Pt's duaghter in room and very helpful    Exercises     Assessment/Plan    PT Assessment Patient needs continued PT services  PT Problem List Decreased strength;Decreased range of motion;Decreased activity tolerance;Decreased balance;Decreased mobility;Decreased knowledge of use of DME;Decreased knowledge of precautions;Pain       PT Treatment Interventions DME instruction;Gait training;Stair training;Functional mobility training;Therapeutic activities;Therapeutic exercise;Balance training;Neuromuscular re-education;Patient/family education    PT Goals (Current goals can be found in the Care Plan section)  Acute Rehab PT Goals Patient Stated Goal: daughter reports that she wants to take pt home PT Goal Formulation: With patient/family Time For Goal Achievement: 10/14/20 Potential to Achieve Goals: Good    Frequency Min 5X/week   Barriers to discharge        Co-evaluation               AM-PAC PT "6 Clicks" Mobility  Outcome Measure Help needed turning from your back to your side while in a flat bed without using bedrails?: A Lot Help needed moving from lying on your back to sitting on the side of a flat bed without  using bedrails?: A Lot Help needed moving to and from a bed to a chair (including a wheelchair)?: A Lot Help needed standing up from a chair using your arms (e.g., wheelchair or bedside chair)?: A Lot Help needed to walk in hospital room?: A Little Help needed climbing 3-5 steps with a railing? : A Lot 6 Click Score: 13    End of Session Equipment Utilized During Treatment: Back brace Activity Tolerance: Patient limited by pain Patient left: in chair;with call bell/phone within reach;with chair alarm set;with family/visitor present;Other (comment) (preparing to eat lunch) Nurse Communication: Mobility status PT Visit Diagnosis: Unsteadiness on feet (R26.81);Other abnormalities of gait and mobility (R26.89);Pain Pain - part of body:  (Back)    Time: 2025-4270 PT Time Calculation (min) (ACUTE ONLY): 25 min   Charges:   PT Evaluation $PT Eval Moderate Complexity: 1 Mod          Roney [place], [place]  Acute Rehabilitation Services Pager [phone] Office [phone]   Colletta [place] 09/30/2020, 5:26 PM

## 2020-09-30 NOTE — Progress Notes (Signed)
Orthopedic Tech Progress Note Patient Details:  Aerilynn Goin 04/01/1927 [id number] Was called by THERAPY to come and get patient TLSO BRACE fitting better. Patient ID: Dejana Pugsley, female   DOB: 25-Dec-1926, 86 y.o.   MRN: [id number]   Janit Pagan 09/30/2020, 5:15 PM

## 2020-10-01 MED ORDER — SENNOSIDES-DOCUSATE SODIUM 8.6-50 MG PO TABS: 2.0000 | ORAL_TABLET | Freq: Two times a day (BID) | ORAL | Status: DC | PRN

## 2020-10-01 MED ORDER — ACETAMINOPHEN 500 MG PO TABS
1000.0000 mg | ORAL_TABLET | Freq: Three times a day (TID) | ORAL | 0 refills | Status: DC
Start: 2020-10-01 — End: 2021-04-12

## 2020-10-01 MED ORDER — VITAMIN D (ERGOCALCIFEROL) 1.25 MG (50000 UNIT) PO CAPS: 50000.0000 [IU] | ORAL_CAPSULE | ORAL | 0 refills | Status: DC

## 2020-10-01 MED ORDER — MINERAL OIL RE ENEM
1.0000 | ENEMA | Freq: Once | RECTAL | Status: AC
  Administered 2020-10-01: 1 via RECTAL
  Filled 2020-10-01: qty 1

## 2020-10-01 NOTE — NC FL2 (Signed)
[place] MEDICAID FL2 LEVEL OF CARE SCREENING TOOL     IDENTIFICATION  Patient Name: [name] Birthdate: 01/13/1927 Sex: female Admission Date (Current Location): 09/27/2020  [facility] and [place] Number:  Herbalist and Address:  [facility], [place] [address], [place], [place] [address]      Provider Number: [id number]  Attending Physician Name and Address:  Mercy Riding, MD  Relative Name and Phone Number:       Current Level of Care: Hospital Recommended Level of Care: [place] Prior Approval Number:    Date Approved/Denied:   PASRR Number: [id number]  Discharge Plan: SNF    Current Diagnoses: Patient Active Problem List   Diagnosis Date Noted  . Fall at home, initial encounter 09/27/2020  . Post-surgical hypothyroidism   . Neuropathy   . Hypertension   . VTE (venous thromboembolism) 12/2018    Orientation RESPIRATION BLADDER Height & Weight     Self,Time,Situation,Place  Normal Continent Weight: 93.5 kg Height:  5\' 3"  (160 cm)  BEHAVIORAL SYMPTOMS/MOOD NEUROLOGICAL BOWEL NUTRITION STATUS      Continent Diet  AMBULATORY STATUS COMMUNICATION OF NEEDS Skin   Extensive Assist Verbally Normal                       Personal Care Assistance Level of Assistance  Bathing,Dressing,Feeding Bathing Assistance: Limited assistance Feeding assistance: Limited assistance Dressing Assistance: Limited assistance     Functional Limitations Info             SPECIAL CARE FACTORS FREQUENCY  PT (By licensed PT),OT (By licensed OT)     PT Frequency: five times a week OT Frequency: five times a week            Contractures Contractures Info: Not present    Additional Factors Info  Code Status,Allergies Code Status Info: DNR Allergies Info: codeine           Current Medications (10/01/2020):  This is the current hospital active medication list Current Facility-Administered Medications   Medication Dose Route Frequency Provider Last Rate Last Admin  . acetaminophen (TYLENOL) tablet 1,000 mg  1,000 mg Oral Q8H Wendee Beavers T, MD   1,000 mg at 10/01/20 0506  . apixaban (ELIQUIS) tablet 5 mg  5 mg Oral BID Wendee Beavers T, MD   5 mg at 10/01/20 0829  . cefTRIAXone (ROCEPHIN) 1 g in sodium chloride 0.9 % 100 mL IVPB  1 g Intravenous Q24H Wendee Beavers T, MD 200 mL/hr at 09/30/20 1656 1 g at 09/30/20 1656  . docusate sodium (COLACE) capsule 100 mg  100 mg Oral BID Karmen Bongo, MD   100 mg at 10/01/20 5102  . hydrALAZINE (APRESOLINE) injection 5 mg  5 mg Intravenous Q4H PRN Karmen Bongo, MD      . levothyroxine (SYNTHROID) tablet 75 mcg  75 mcg Oral QAC breakfast Karmen Bongo, MD   75 mcg at 10/01/20 0506  . magnesium citrate solution 1 Bottle  1 Bottle Oral Daily PRN Mercy Riding, MD   1 Bottle at 10/01/20 1004  . methocarbamol (ROBAXIN) tablet 500 mg  500 mg Oral Q8H PRN Wendee Beavers T, MD   500 mg at 10/01/20 0829  . morphine 2 MG/ML injection 2 mg  2 mg Intravenous Q4H PRN Wendee Beavers T, MD   2 mg at 10/01/20 1234  . ondansetron (ZOFRAN) tablet 4 mg  4 mg Oral Q6H PRN Karmen Bongo, MD  Or  . ondansetron (ZOFRAN) injection 4 mg  4 mg Intravenous Q6H PRN Karmen Bongo, MD   4 mg at 09/27/20 1758  . polyethylene glycol (MIRALAX / GLYCOLAX) packet 17 g  17 g Oral BID PRN Wendee Beavers T, MD   17 g at 10/01/20 0829  . polyvinyl alcohol (LIQUIFILM TEARS) 1.4 % ophthalmic solution 1 drop  1 drop Both Eyes PRN Mercy Riding, MD   1 drop at 09/30/20 1128  . pregabalin (LYRICA) capsule 75 mg  75 mg Oral BID Karmen Bongo, MD   75 mg at 10/01/20 0829  . senna-docusate (Senokot-S) tablet 2 tablet  2 tablet Oral BID PRN Mercy Riding, MD   2 tablet at 10/01/20 1004  . traMADol (ULTRAM) tablet 50 mg  50 mg Oral Q8H PRN Mercy Riding, MD   50 mg at 10/01/20 0829  . Vitamin D (Ergocalciferol) (DRISDOL) capsule 50,000 Units  50,000 Units Oral Q7 days Mercy Riding, MD          Discharge Medications: Please see discharge summary for a list of discharge medications.  Relevant Imaging Results:  Relevant Lab Results:   Additional Information Alert oriented x 4 , on Eliquis, T12 compression fracture with possible small epidural hemorrhage  Dejana Pugsley, Edson Snowball, RN

## 2020-10-01 NOTE — Progress Notes (Signed)
Physical Therapy Treatment Patient Details Name: [name] MRN: [id number] DOB: 1927/05/02 Today's Date: 10/01/2020    History of Present Illness Pt is a 86 yo female s/p h/o DVT/PE on Eliquis, HTN, neuropathy, diastolic CHF and hypothyroidism brought to ED after what looks like an accidental fall. Lspine MRI: T12 compression fx, possible T11 fx and disc bulge at L3-4, L4-5. No surgery recommended, pain control with TLSO.    PT Comments    The pt is continuing to make progress with mobility, but was limited by significant pain at the time of today's session. The pt was able to come to sitting EOB with modA for all bed mobility and significantly increased time/effort to complete movement due to pain. The pt was then able to complete OOB transfers and mobiltiy with mod/minA and use of RW for stability, but was unable to progress walking distance due to significant pain. The pt will continue to benefit from skilled PT to progress functional endurance, strength, and reduced dependence on caregiver for all mobility prior to anticipated d/c home with her daughter. The pt has remained limited to <25ft walking in each PT session due to pain/fatigue, and may benefit from [facility] if this persists in future sessions.     Follow Up Recommendations  Home health PT;Supervision/Assistance - 24 hour     Equipment Recommendations  Rolling walker with 5" wheels;3in1 (PT);Other (comment) (will need WC if pain not better controlled)    Recommendations for Other Services       Precautions / Restrictions Precautions Precautions: Fall;Back Precaution Booklet Issued: No Precaution Comments: discussion of back precautions and log roll Required Braces or Orthoses: Spinal Brace Spinal Brace: Thoracolumbosacral orthotic;Applied in sitting position Restrictions Weight Bearing Restrictions: No    Mobility  Bed Mobility Overal bed mobility: Needs Assistance Bed Mobility: Rolling;Sidelying to Sit Rolling: Mod  assist Sidelying to sit: Mod assist;HOB elevated       General bed mobility comments: modA to complete roll, VC for sequencing and movement of LE and trunk. pt and daughter educated on log roll for comfort  Transfers Overall transfer level: Needs assistance Equipment used: Rolling walker (2 wheeled) Transfers: Sit to/from Stand Sit to Stand: Mod assist         General transfer comment: modA with significantly increased time. pt able to steady without assist with BUE supported. cues for hip ext/posture  Ambulation/Gait Ambulation/Gait assistance: Min assist Gait Distance (Feet): 6 Feet Assistive device: Rolling walker (2 wheeled) Gait Pattern/deviations: Decreased step length - right;Decreased step length - left Gait velocity: decreased Gait velocity interpretation: <1.31 ft/sec, indicative of household ambulator General Gait Details: Short steps with heavy dependence on UE support on RW         Balance Overall balance assessment: Needs assistance Sitting-balance support: Bilateral upper extremity supported;Feet supported Sitting balance-Leahy Scale: Poor Sitting balance - Comments: requires BUEs for sitting balance   Standing balance support: Bilateral upper extremity supported Standing balance-Leahy Scale: Poor Standing balance comment: Assist with RW and external support for stability                            Cognition Arousal/Alertness: Awake/alert Behavior During Therapy: WFL for tasks assessed/performed;Flat affect Overall Cognitive Status: Difficult to assess                                 General Comments: Pt following all commands with  increased time, but not very talkative. Able to accurately relay info about home set up and yesterday's activities      Exercises General Exercises - Lower Extremity Long Arc Quad: AROM;Strengthening;Both;5 reps;Seated (x5 AROM, x5 against min resistance) Heel Slides: AROM;Strengthening;Both;5  reps;Seated (x5 AROM, x5 against min resitance) Heel Raises: AROM;Both;15 reps;Seated    General Comments General comments (skin integrity, edema, etc.): pt daughter in room at end of session, discussed log roll technique, heat/ice management for pain at home      Pertinent Vitals/Pain Pain Assessment: Faces Faces Pain Scale: Hurts whole lot Pain Location: low back Pain Descriptors / Indicators: Crying;Discomfort;Grimacing;Moaning;Sharp;Sore Pain Intervention(s): Limited activity within patient's tolerance;Monitored during session;RN gave pain meds during session           PT Goals (current goals can now be found in the care plan section) Acute Rehab PT Goals Patient Stated Goal: daughter reports that she wants to take pt home PT Goal Formulation: With patient/family Time For Goal Achievement: 10/14/20 Potential to Achieve Goals: Good Progress towards PT goals: Not progressing toward goals - comment (pain)    Frequency    Min 5X/week      PT Plan Current plan remains appropriate       AM-PAC PT "6 Clicks" Mobility   Outcome Measure  Help needed turning from your back to your side while in a flat bed without using bedrails?: A Lot Help needed moving from lying on your back to sitting on the side of a flat bed without using bedrails?: A Lot Help needed moving to and from a bed to a chair (including a wheelchair)?: A Little Help needed standing up from a chair using your arms (e.g., wheelchair or bedside chair)?: A Lot Help needed to walk in hospital room?: A Little Help needed climbing 3-5 steps with a railing? : A Lot 6 Click Score: 14    End of Session Equipment Utilized During Treatment: Back brace Activity Tolerance: Patient limited by pain Patient left: in chair;with call bell/phone within reach;with chair alarm set;with family/visitor present Nurse Communication: Mobility status;Patient requests pain meds PT Visit Diagnosis: Unsteadiness on feet (R26.81);Other  abnormalities of gait and mobility (R26.89);Pain Pain - part of body:  (back)     Time: 5027-7412 PT Time Calculation (min) (ACUTE ONLY): 32 min  Charges:  $Gait Training: 8-22 mins $Therapeutic Activity: 8-22 mins                     Karma Ganja, PT, DPT   Acute Rehabilitation Department Pager #: [phone]   Otho Bellows 10/01/2020, 9:04 AM

## 2020-10-01 NOTE — Care Management (Signed)
    Durable Medical Equipment  (From admission, onward)         Start     Ordered   10/01/20 1507  For home use only DME standard manual wheelchair with seat cushion  Once       Comments: Patient suffers from T12 compression fracture with about 10% height loss which impairs their ability to perform daily activities like ambulating  in the home.  A cane  will not resolve issue with performing activities of daily living. A wheelchair will allow patient to safely perform daily activities. Patient can safely propel the wheelchair in the home or has a caregiver who can provide assistance. Length of need lifetime . Accessories: elevating leg rests (ELRs), wheel locks, extensions and anti-tippers.  Seat and back cushion   10/01/20 1507   10/01/20 0756  DME 3-in-1  Once        10/01/20 0755   10/01/20 0756  DME Walker  Once       Question Answer Comment  Walker: With [employer] Wheels   Patient needs a walker to treat with the following condition Unsteady gait      10/01/20 0755

## 2020-10-01 NOTE — Progress Notes (Signed)
Physical Therapy Treatment Patient Details Name: [name] MRN: [id number] DOB: 22-Aug-1927 Today's Date: 10/01/2020    History of Present Illness Pt is a 86 yo female s/p h/o DVT/PE on Eliquis, HTN, neuropathy, diastolic CHF and hypothyroidism brought to ED after what looks like an accidental fall. Lspine MRI: T12 compression fx, possible T11 fx and disc bulge at L3-4, L4-5. No surgery recommended, pain control with TLSO.    PT Comments    Pt seen by PT this afternoon primarily to discuss d/c plan and current mobility status with pt's daughter [name], caretaker). However, upon arrival of PT, the pt was soiled in bed and required assist to complete rolling and pericare/cleaning. The pt is able to complete movements with increased time and assist to reach for bed rails but was able to assist with all extremities.   I discussed the d/c plan of home with HHPT follow up for progression of gait, endurance, independence with transfers, and self care with the pt's daughter who appreciated the detailed information. We also discussed use of equipment at home and plan to use [facility] for longer mobility but RW for short distances in the home. The daughter was in agreement with plan, and hopeful for one more PT visit in the morning prior to d/c to further progress gait.    Follow Up Recommendations  Home health PT;Supervision/Assistance - 24 hour     Equipment Recommendations  Rolling walker with 5" wheels;Wheelchair (measurements PT);Wheelchair cushion (measurements PT)    Recommendations for Other Services       Precautions / Restrictions Precautions Precautions: Fall;Back Precaution Booklet Issued: No Precaution Comments: discussion of back precautions and log roll Required Braces or Orthoses: Spinal Brace Spinal Brace: Thoracolumbosacral orthotic;Applied in sitting position Restrictions Weight Bearing Restrictions: No    Mobility  Bed Mobility Overal bed mobility: Needs Assistance Bed  Mobility: Rolling Rolling: Mod assist         General bed mobility comments: modA with minA at times to complete rolling in both directions to allow for pericare/cleaning after BM in the bed. The pt declined further transfers at this time to attempt sitting BSC.  Transfers                 General transfer comment: pt declined at this time  Ambulation/Gait                 Stairs             Wheelchair Mobility    Modified Rankin (Stroke Patients Only)       Balance                                            Cognition Arousal/Alertness: Awake/alert Behavior During Therapy: WFL for tasks assessed/performed;Flat affect Overall Cognitive Status: Difficult to assess                                 General Comments: Pt following all commands with increased time, but not very talkative. Able to accurately relay info about home set up and yesterday's activities      Exercises      General Comments General comments (skin integrity, edema, etc.): Discussed d/c plan and equipment with daughter at length, discussed mobility in the home and role of HHPT. Daughter agreeable to plan  Pertinent Vitals/Pain Pain Assessment: Faces Faces Pain Scale: Hurts whole lot Pain Location: low back Pain Descriptors / Indicators: Crying;Discomfort;Grimacing;Moaning;Sharp;Sore Pain Intervention(s): Limited activity within patient's tolerance;Monitored during session;Repositioned           PT Goals (current goals can now be found in the care plan section) Acute Rehab PT Goals Patient Stated Goal: daughter reports that she wants to take pt home PT Goal Formulation: With patient/family Time For Goal Achievement: 10/14/20 Potential to Achieve Goals: Good Progress towards PT goals: Progressing toward goals    Frequency    Min 5X/week      PT Plan Current plan remains appropriate       AM-PAC PT "6 Clicks" Mobility    Outcome Measure  Help needed turning from your back to your side while in a flat bed without using bedrails?: A Lot Help needed moving from lying on your back to sitting on the side of a flat bed without using bedrails?: A Lot Help needed moving to and from a bed to a chair (including a wheelchair)?: A Little Help needed standing up from a chair using your arms (e.g., wheelchair or bedside chair)?: A Lot Help needed to walk in hospital room?: A Little Help needed climbing 3-5 steps with a railing? : A Lot 6 Click Score: 14    End of Session Equipment Utilized During Treatment: Back brace Activity Tolerance: Patient limited by pain Patient left: in chair;with call bell/phone within reach;with chair alarm set;with family/visitor present Nurse Communication: Mobility status;Patient requests pain meds PT Visit Diagnosis: Unsteadiness on feet (R26.81);Other abnormalities of gait and mobility (R26.89);Pain Pain - part of body:  (back)     Time: 2080-2233 PT Time Calculation (min) (ACUTE ONLY): 28 min  Charges:  $Therapeutic Activity: 8-22 mins $Self Care/Home Management: 8-22                     Karma Ganja, PT, DPT   Acute Rehabilitation Department Pager #: [phone]   Otho Bellows 10/01/2020, 3:00 PM

## 2020-10-01 NOTE — TOC Initial Note (Signed)
Transition of Care [facility]) - Initial/Assessment Note    Patient Details  Name: [name] MRN: [id number] Date of Birth: [birth year]  Transition of Care [facility]) CM/SW Contact:    Marilu Favre, RN Phone Number: 10/01/2020, 3:11 PM  Clinical Narrative:                 Patient from home with daughter Judd Gaudier. Spoke to both at bedside. Once pain is more controlled, plan is patient will return to home with [name] and home health PT/OT aide.   Provided medicare.gov list, will follow up for choice.   Confirmed face sheet information.   Ordered walker, 3 in 1 and wheel chair with [employer]   Expected Discharge Plan: [place]     Patient Goals and CMS Choice Patient states their goals for this hospitalization and ongoing recovery are:: to return to home CMS Medicare.gov Compare Post Acute Care list provided to:: Patient Choice offered to / list presented to : [place]  Expected Discharge Plan and Services Expected Discharge Plan: [place]   Discharge Planning Services: CM Consult Post Acute Care Choice: [place] arrangements for the past 2 months: Single Family Home Expected Discharge Date: 10/01/20               DME Arranged: 3-N-1,Walker rolling,Wheelchair manual DME Agency: [employer] Date DME Agency Contacted: 10/01/20 Time DME Agency Contacted: [phone] Representative spoke with at DME Agency: Freda Munro HH Arranged: PT,OT,Nurse's Aide          Prior Living Arrangements/Services Living arrangements for the past 2 months: [place] with:: Adult Children Patient language and need for interpreter reviewed:: Yes Do you feel safe going back to the place where you live?: Yes      Need for Family Participation in Patient Care: Yes (Comment) Care giver support system in place?: Yes (comment)   Criminal Activity/Legal Involvement Pertinent to Current Situation/Hospitalization:  No - Comment as needed  Activities of Daily Living Home Assistive Devices/Equipment: Dentures (specify type),Eyeglasses,Walker (specify type),Cane (specify quad or straight) ADL Screening (condition at time of admission) Patient's cognitive ability adequate to safely complete daily activities?: Yes Is the patient deaf or have difficulty hearing?: No Does the patient have difficulty seeing, even when wearing glasses/contacts?: Yes Does the patient have difficulty concentrating, remembering, or making decisions?: No Patient able to express need for assistance with ADLs?: Yes Does the patient have difficulty dressing or bathing?: No Independently performs ADLs?: Yes (appropriate for developmental age) Does the patient have difficulty walking or climbing stairs?: Yes Weakness of Legs: Left Weakness of Arms/Hands: None  Permission Sought/Granted   Permission granted to share information with : Yes, Verbal Permission Granted  Share Information with NAME: Massie Kluver [phone]           Emotional Assessment Appearance:: Appears stated age Attitude/Demeanor/Rapport: Engaged Affect (typically observed): Accepting Orientation: : Oriented to Self,Oriented to Place,Oriented to  Time,Oriented to Situation Alcohol / Substance Use: Not Applicable Psych Involvement: No (comment)  Admission diagnosis:  Fall, initial encounter [W19.XXXA] Fall at home, initial encounter [phone], B09.628] Other closed fracture of first lumbar vertebra, initial encounter [facility]) [S32.018A] Patient Active Problem List   Diagnosis Date Noted  . Fall at home, initial encounter 09/27/2020  . Post-surgical hypothyroidism   . Neuropathy   . Hypertension   . VTE (venous thromboembolism) 12/2018   PCP:  [name], DO Pharmacy:   [facility] [phone] -  [place], [place] [address] [place] [address] Phone: [phone] Fax:  [phone]     Social Determinants of Health (SDOH) Interventions    Readmission Risk Interventions No flowsheet data found.

## 2020-10-01 NOTE — Progress Notes (Signed)
PROGRESS NOTE  [name] DOB: 10/09/1927   PCP: [name], DO  Patient is from: Home.  Lives with daughter.  Uses walker at baseline.  DOA: 09/27/2020 LOS: 4  Chief complaints: Fall.  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  In ED, slightly hypertensive.  Labs without significant finding.  CT head without acute finding.  CT abdomen and pelvis raise concern for mild L1 compression fracture.  Neurosurgery consulted and recommended lumbar MRI.  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  Neurosurgery recommended pain control, TLSO brace, PT/OT and outpatient follow-up in 4 weeks.  Therapy recommended home health and DME on discharge.  Subjective: Seen and examined earlier this morning.  No major events overnight of this morning.  Concerned about not having bowel movement.  Also reports 9/10 pain in her low back.  Denies numbness or tingling in legs.  Denies dysuria today.  Objective: Vitals:   09/30/20 1447 09/30/20 2013 10/01/20 0507 10/01/20 1433  BP: (!) 145/84 (!) 148/81 129/74 (!) 161/79  Pulse: 93 88 99 81  Resp: 18 20 19 18   Temp: 98.9 F (37.2 C) 98.6 F (37 C) 97.9 F (36.6 C) 98.1 F (36.7 C)  TempSrc: Oral Oral Oral Oral  SpO2: 97% 96% 99% 96%  Weight:      Height:        Intake/Output Summary (Last 24 hours) at 10/01/2020 1534 Last data filed at 10/01/2020 1133 Gross per 24 hour  Intake 120 ml  Output 850 ml  Net -730 ml   Filed Weights   09/27/20 2100  Weight: 93.5 kg    Examination: GENERAL: No apparent distress.  Nontoxic. HEENT: MMM.  Vision and hearing grossly  intact.  NECK: Supple.  No apparent JVD.  RESP: On room air.  No IWOB.  Fair aeration bilaterally. CVS:  RRR. Heart sounds normal.  ABD/GI/GU: BS+. Abd soft, NTND.  MSK/EXT:  Moves extremities. No apparent deformity. No edema.  SKIN: no apparent skin lesion or wound NEURO: Awake, alert and oriented appropriately.  Motor 3+/5 in BLE.  Sensory reflex grossly intact. PSYCH: Calm. Normal affect.  Procedures:  None  Microbiology summarized: [id number] and influenza PCR nonreactive.  Assessment & Plan: Accidental fall at home-no syncope or presyncope.  He had a headache but no LOS.  CTH negative.  T12 compression fracture with about 10% height loss Possible T11 compression fracture Possible small epidural hemorrhage Vitamin D deficiency-vitamin D level 11.48. -Neurosurgery recs:pain control, TLSO brace, PT/OT and outpatient f/up in 4 weeks -Pain control with scheduled Tylenol, as needed Robaxin, tramadol and IV morphine -Okay to resume Eliquis per neurosurgery as of 12/7. -P.o. vitamin D 50,000 units weekly.  Add calcium supplementation as well -Bowel regimen-scheduled Colace, as needed MiraLAX, Senokot-S, mag citrate and/or Fleet enema -PT/OT-recommended home health and DME.  Neuropathy/possible radiculopathy Severe facet arthropathy at L3-4, L4-5 and L5-S1 Broad disc bulge with severe canal stenosis at L3-4 and L4-5 -No signs of cauda equina or red flags. -Continue Lyrica and other pain meds as above -Outpatient follow-up with neurosurgery in 4 weeks  E. coli UTI: Hard dysuria with new back pain.  Urine culture with E. coli.  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  Has been on Eliquis for about a year. -Continue home Eliquis  Hypothyroidism: TSH 5.341. -Continue home Synthroid  Essential hypertension: Normotensive.  Only on Lasix at home. -Continue holding Lasix -As needed  hydralazine  Normocytic anemia/iron deficiency anemia: Hgb down about 2 g since admit.  Likely dilutional from IV fluid.  Denies GI bleed.  Anemia panel shows some degree of iron deficiency. -P.o. ferrous sulfate with bowel regimen -Monitor H&H  Debility/physical deconditioning: Uses walker at baseline. -PT/OT eval  Thrombocytopenia: Platelet 129> 125.  Relatively stable. -Continue monitoring  Constipation: Concerned about this. -Management as above.  Body mass index is 36.51 kg/m.         DVT prophylaxis:   apixaban (ELIQUIS) tablet 5 mg  Code Status: DNR/DNI Family Communication: Updated patient's daughter at bedside Status is: Inpatient  Remains inpatient appropriate because:Unsafe d/c plan, IV treatments appropriate due to intensity of illness or inability to take PO and Inpatient level of care appropriate due to severity of illness   Dispo: The patient is from: Home              Anticipated d/c is to: Home with home health              Anticipated d/c date is: 1 day              Patient currently is not medically stable to d/c.       Consultants:  Neurosurgery-signed off   Sch Meds:  Scheduled Meds: . acetaminophen  1,000 mg Oral Q8H  . apixaban  5 mg Oral BID  . docusate sodium  100 mg Oral BID  . levothyroxine  75 mcg Oral QAC breakfast  . pregabalin  75 mg Oral BID  . Vitamin D (Ergocalciferol)  50,000 Units Oral Q7 days   Continuous Infusions: . cefTRIAXone (ROCEPHIN)  IV 1 g (09/30/20 1656)   PRN Meds:.hydrALAZINE, magnesium citrate, methocarbamol, morphine injection, ondansetron **OR** ondansetron (ZOFRAN) IV, polyethylene glycol, polyvinyl alcohol, senna-docusate, traMADol  Antimicrobials: Anti-infectives (From admission, onward)   Start     Dose/Rate Route Frequency Ordered Stop   09/29/20 1600  cefTRIAXone (ROCEPHIN) 1 g in sodium chloride 0.9 % 100 mL IVPB        1 g 200 mL/hr over 30 Minutes Intravenous Every 24 hours 09/29/20 1503          I have personally reviewed the following labs and images: CBC: Recent Labs  Lab 09/27/20 0621 09/28/20 0342 09/29/20 0504 09/30/20 0054  WBC 5.7 5.9 6.0 5.1  NEUTROABS 3.2  --   --   --   HGB 12.5 11.9* 10.7* 10.9*  HCT 37.5 34.3* 32.9* 33.7*  MCV 86.0 83.9 85.9 85.3  PLT 149* 141* 129* 125*   BMP &GFR Recent Labs  Lab 09/27/20 0621 09/28/20 0342 09/30/20 0054  NA 141 140 139  K 3.7 3.9 3.9  CL 108 107 104  CO2 24 25 28   GLUCOSE 116* 101* 110*  BUN 13 10 10   CREATININE 0.77 0.68 0.67  CALCIUM 9.2 8.8* 8.5*  MG  --   --  1.9  PHOS  --   --  2.9   Estimated Creatinine Clearance: 49.7 mL/min (by C-G formula based on SCr of 0.67 mg/dL). Liver & Pancreas: Recent Labs  Lab 09/27/20 0621 09/30/20 0054  AST 20  --   ALT 16  --   ALKPHOS 93  --  BILITOT 0.5  --   PROT 6.5  --   ALBUMIN 3.3* 2.6*   No results for input(s): LIPASE, AMYLASE in the last 168 hours. No results for input(s): AMMONIA in the last 168 hours. Diabetic: No results for input(s): HGBA1C in the last 72 hours. No results for input(s): GLUCAP in the last 168 hours. Cardiac Enzymes: No results for input(s): CKTOTAL, CKMB, CKMBINDEX, TROPONINI in the last 168 hours. No results for input(s): PROBNP in the last 8760 hours. Coagulation Profile: No results for input(s): INR, PROTIME in the last 168 hours. Thyroid Function Tests: No results for input(s): TSH, T4TOTAL, FREET4, T3FREE, THYROIDAB in the last 72 hours. Lipid Profile: No results for input(s): CHOL, HDL, LDLCALC, TRIG, CHOLHDL, LDLDIRECT in the last 72 hours. Anemia Panel: Recent Labs    09/30/20 0054  VITAMINB12 241  FOLATE 12.0  FERRITIN 44  TIBC 235*  IRON 24*  RETICCTPCT 1.3   Urine analysis:    Component Value Date/Time   COLORURINE YELLOW 09/29/2020 1502   APPEARANCEUR CLEAR 09/29/2020 1502   LABSPEC 1.009 09/29/2020 1502   PHURINE 5.0 09/29/2020 1502   GLUCOSEU NEGATIVE 09/29/2020 1502   HGBUR SMALL (A)  09/29/2020 1502   BILIRUBINUR NEGATIVE 09/29/2020 1502   KETONESUR NEGATIVE 09/29/2020 1502   PROTEINUR NEGATIVE 09/29/2020 1502   NITRITE NEGATIVE 09/29/2020 1502   LEUKOCYTESUR NEGATIVE 09/29/2020 1502   Sepsis Labs: Invalid input(s): PROCALCITONIN, [place]  Microbiology: Recent Results (from the past 240 hour(s))  Resp Panel by RT-PCR (Flu A&B, Covid) Nasopharyngeal Swab     Status: None   Collection Time: 09/27/20  1:54 PM   Specimen: Nasopharyngeal Swab; Nasopharyngeal(NP) swabs in vial transport medium  Result Value Ref Range Status   SARS Coronavirus 2 by RT PCR NEGATIVE NEGATIVE Final    Comment: (NOTE) SARS-CoV-2 target nucleic acids are NOT DETECTED.  The SARS-CoV-2 RNA is generally detectable in upper respiratory specimens during the acute phase of infection. The lowest concentration of SARS-CoV-2 viral copies this assay can detect is 138 copies/mL. A negative result does not preclude SARS-Cov-2 infection and should not be used as the sole basis for treatment or other patient management decisions. A negative result may occur with  improper specimen collection/handling, submission of specimen other than nasopharyngeal swab, presence of viral mutation(s) within the areas targeted by this assay, and inadequate number of viral copies(<138 copies/mL). A negative result must be combined with clinical observations, patient history, and epidemiological information. The expected result is Negative.  Fact Sheet for Patients:  EntrepreneurPulse.com.au  Fact Sheet for Healthcare Providers:  IncredibleEmployment.be  This test is no t yet approved or cleared by the [place] FDA and  has been authorized for detection and/or diagnosis of SARS-CoV-2 by FDA under an Emergency Use Authorization (EUA). This EUA will remain  in effect (meaning this test can be used) for the duration of the COVID-19 declaration under Section 564(b)(1) of the  Act, 21 U.S.C.section 360bbb-3(b)(1), unless the authorization is terminated  or revoked sooner.       Influenza A by PCR NEGATIVE NEGATIVE Final   Influenza B by PCR NEGATIVE NEGATIVE Final    Comment: (NOTE) The Xpert Xpress SARS-CoV-2/FLU/RSV plus assay is intended as an aid in the diagnosis of influenza from Nasopharyngeal swab specimens and should not be used as a sole basis for treatment. Nasal washings and aspirates are unacceptable for Xpert Xpress SARS-CoV-2/FLU/RSV testing.  Fact Sheet for Patients: EntrepreneurPulse.com.au  Fact Sheet for Healthcare Providers: IncredibleEmployment.be  This test is  not yet approved or cleared by the [place] and has been authorized for detection and/or diagnosis of SARS-CoV-2 by FDA under an Emergency Use Authorization (EUA). This EUA will remain in effect (meaning this test can be used) for the duration of the COVID-19 declaration under Section 564(b)(1) of the Act, 21 U.S.C. section 360bbb-3(b)(1), unless the authorization is terminated or revoked.  Performed at [facility], [place] [address], [place], [place] [address]   Culture, Urine     Status: Abnormal (Preliminary result)   Collection Time: 09/29/20  3:02 PM   Specimen: Urine, Catheterized  Result Value Ref Range Status   Specimen Description URINE, CATHETERIZED  Final   Special Requests NONE  Final   Culture (A)  Final    >=100,000 COLONIES/mL ESCHERICHIA COLI SUSCEPTIBILITIES TO FOLLOW Performed at [facility], [place] [address] [place], [place] [address]    Report Status PENDING  Incomplete    Radiology Studies: No results found.   [name] T. [place]  If 7PM-7AM, please contact night-coverage [email or web address] 10/01/2020, 3:34 PM

## 2020-10-01 NOTE — Plan of Care (Signed)

## 2020-10-01 NOTE — Progress Notes (Signed)
Subjective: Patient reports that she is continuing to have a moderate amount of low back pain. Constipation persists. No acute events reported overnight.   Objective: Vital signs in last 24 hours: Temp:  [97.9 F (36.6 C)-98.9 F (37.2 C)] 97.9 F (36.6 C) (12/09 0507) Pulse Rate:  [88-99] 99 (12/09 0507) Resp:  [18-20] 19 (12/09 0507) BP: (129-148)/(74-84) 129/74 (12/09 0507) SpO2:  [96 %-99 %] 99 % (12/09 0507)  Intake/Output from previous day: 12/08 0701 - 12/09 0700 In: 520 [P.O.:520] Out: 700 [Urine:700] Intake/Output this shift: No intake/output data recorded.  Physical Exam: Constitutional:  General: She is not in acute distress. Cardiovascular:  Rate and Rhythm: Normal rate and regular rhythm.  Heart sounds: Normal heart sounds. No murmur heard.  Pulmonary:  Effort: Pulmonary effort is normal. No respiratory distress.  Breath sounds: Normal breath sounds. No stridor. No wheezing.  Musculoskeletal:  Cervical back: Normal range of motion and neck supple. No bony tenderness.  Thoracic back: No bony tenderness.  Lumbar back: Tenderness and bony tenderness present.  Neurological:  Mental Status: She is alert and oriented to person, place, and time.  Comments: Moving all extremities  Lab Results: Recent Labs    09/29/20 0504 09/30/20 0054  WBC 6.0 5.1  HGB 10.7* 10.9*  HCT 32.9* 33.7*  PLT 129* 125*   BMET Recent Labs    09/30/20 0054  NA 139  K 3.9  CL 104  CO2 28  GLUCOSE 110*  BUN 10  CREATININE 0.67  CALCIUM 8.5*    Studies/Results: DG Thoracic Spine 2 View  Result Date: 09/29/2020 CLINICAL DATA:  T12 vertebral body compression fracture. Low back pain. Recent fall. EXAM: THORACIC SPINE 2 VIEWS COMPARISON:  CT abdomen pelvis 09/27/2020 and lumbar spine MRI 09/27/2020 FINDINGS: Normal alignment at the cervicothoracic junction. Degenerative facet arthropathy in the visualized cervical spine. Mild vertebral height  loss at T12 and similar to the recent imaging. No clear evidence for additional thoracic vertebral body compression fractures. IMPRESSION: 1. Mild vertebral body height loss at T12 and compatible with known compression fracture. 2. No other definite thoracic vertebral body compression fractures. 3. Degenerative changes in the cervical spine. Electronically Signed   By: Markus Daft M.D.   On: 09/29/2020 09:58   DG Lumbar Spine 2-3 Views  Result Date: 09/29/2020 CLINICAL DATA:  Fall 3 days ago.  Low back pain EXAM: LUMBAR SPINE - 2-3 VIEW COMPARISON:  CT 09/27/2020.  MRI 09/27/2020. FINDINGS: Diffuse osteopenia. Moderate compression fracture at T12 appears progressed since prior study 2 days ago. Diffuse degenerative facet disease throughout the lumbar spine. No subluxation. IMPRESSION: Progressive moderate T12 compression fracture. Diffuse osteopenia. Diffuse degenerative facet disease. Electronically Signed   By: Rolm Baptise M.D.   On: 09/29/2020 09:58    Assessment/Plan: 86 year old female with compression fracture of T12 vertebral body after fall.She is continuing to do well and increasing mobility as tolerated. Thoracolumbar pain ans constipation persists. Continue working on bowel regimen. Would recommend suppository this morning. If no bowel movement by this afternoon, would recommend enema. Continue Eliquis.Maintain TLSO brace. Continue working on pain control. PT/OT recommending home health. She is able to be discharged home from a neurosurgical perspective once medically stable. She will need to follow up in the clinic in 4 weeks with A/P and lateral thoracolumbar radiographs. No new neurosurgery recommendations at this time. Neurosurgery will sigh off. Call with any questions. I appreciate Family Medicine's help in managing this patient.   LOS: 4 days  Marvis Moeller, DNP, NP-C 10/01/2020, 9:42 AM

## 2020-10-01 NOTE — Care Management (Addendum)
Daughter Mahlon Gammon [phone] not at bedside. Called and left voicemail awaiting call back.  [place] Daughter Mahlon Gammon returned call. NCM discussed discharge walker, 3 in 1 and wheel chair. Ms [place] Crane is on her way to hospital now and would like to speak to PT and MD. ( secure chat sent). Daughter does not feel that her mother is ready for discharge to home. NCM discussed SNF, Ms [place] Crane would like to speak to PTand MD first.  Magdalen Spatz RN

## 2020-10-02 DIAGNOSIS — N10 Acute tubulo-interstitial nephritis: Secondary | ICD-10-CM

## 2020-10-02 LAB — URINE CULTURE: Culture: >=100000 — AB

## 2020-10-02 MED ORDER — CEFAZOLIN SODIUM-DEXTROSE 1-4 GM/50ML-% IV SOLN
1.0000 g | Freq: Three times a day (TID) | INTRAVENOUS | Status: DC
  Administered 2020-10-03 – 2020-10-05 (×6): 1 g via INTRAVENOUS
  Filled 2020-10-02 (×9): qty 50

## 2020-10-02 MED ORDER — MORPHINE SULFATE (PF) 2 MG/ML IV SOLN
2.0000 mg | INTRAVENOUS | Status: DC | PRN
  Filled 2020-10-02: qty 1

## 2020-10-02 MED ORDER — PANTOPRAZOLE SODIUM 40 MG PO TBEC
40.0000 mg | DELAYED_RELEASE_TABLET | Freq: Every day | ORAL | Status: DC
  Administered 2020-10-02 – 2020-10-05 (×4): 40 mg via ORAL
  Filled 2020-10-02 (×4): qty 1

## 2020-10-02 MED ORDER — OXYCODONE HCL 5 MG PO TABS: 5.0000 mg | ORAL_TABLET | Freq: Three times a day (TID) | ORAL | Status: DC | PRN

## 2020-10-02 NOTE — TOC Progression Note (Addendum)
Transition of Care [facility]) - Progression Note    Patient Details  Name: [name] MRN: [id number] Date of Birth: [birth year]  Transition of Care [facility]) CM/SW Contact  Jacalyn Lefevre Edson Snowball, RN Phone Number: 10/02/2020, 11:46 AM  Clinical Narrative:     Damaris Schooner to patient and daughter [name] at bedside . Both have decided on SNF at discharge. No preference , [place] or [place]. Will fax and provide offers when received with Medicare.gov ratings. Both voiced understanding .  [employer] authorization ref number [id number] fax [phone]  Gave patient bed offers. Patient upset with medicare.gov ratings. Called patient's daughter [name] and emailed [name] the bed offers.   Both asked about inpatient rehab. NCM explained PT  recommendation was for SNF. Daughter will be back at the hospital around 4 pm.   1650 Daughter called back , she is interested in [facility] sent referral and called left message for [employer].  [facility] , referral has been sent, no offer, called Magda Paganini at [facility] and she will review referral and call back.   [facility] referral was already sent and no offer. Called Spoke to [place] she will review referral and call back.   [facility], referral was already sent , called [place] she will review referral and call back   Expected Discharge Plan: [place]    Expected Discharge Plan and Services Expected Discharge Plan: [place]   Discharge Planning Services: CM Consult Post Acute Care Choice: [place] arrangements for the past 2 months: Single Family Home Expected Discharge Date: 10/01/20               DME Arranged: 3-N-1,Walker rolling,Wheelchair manual DME Agency: [employer] Date DME Agency Contacted: 10/01/20 Time DME Agency Contacted: [phone] Representative spoke with at DME Agency: [place] Arranged: PT,OT,Nurse's Aide           Social Determinants of  Health (SDOH) Interventions    Readmission Risk Interventions No flowsheet data found.

## 2020-10-02 NOTE — Progress Notes (Addendum)
Physical Therapy Treatment Patient Details Name: [name] MRN: [id number] DOB: [birth year] Today's Date: 10/02/2020    History of Present Illness Pt is a 86 yo female s/p h/o DVT/PE on Eliquis, HTN, neuropathy, diastolic CHF and hypothyroidism brought to ED after what looks like an accidental fall. Lspine MRI: T12 compression fx, possible T11 fx and disc bulge at L3-4, L4-5. No surgery recommended, pain control with TLSO.    PT Comments    Pt supine on arrival, daughter in room and supportive, pt agreeable to therapy session and with good participation in session. Pt performed multiple transfers, needing up to [place] for bed mobility and transfers and fair carryover of cues for mobility sequencing, but will need reinforcement as no carryover of back precautions from previous session. Handout given to reinforce back precautions and supine HEP for strengthening. Pt continues to benefit from PT services to progress toward functional mobility goals. D/C recs and frequency below updated per discussion with pt/daughter and supervising PT Katie F, pt/family in agreement with updated recs.   Follow Up Recommendations  SNF;Supervision/Assistance - 24 hour;Supervision for mobility/OOB     Equipment Recommendations  Rolling walker with 5" wheels;Wheelchair (measurements PT);Wheelchair cushion (measurements PT)    Recommendations for Other Services       Precautions / Restrictions Precautions Precautions: Fall;Back Precaution Booklet Issued: Yes (comment) Precaution Comments: handout given for back precs and log roll Required Braces or Orthoses: Spinal Brace Spinal Brace: Thoracolumbosacral orthotic;Applied in sitting position Restrictions Weight Bearing Restrictions: No    Mobility  Bed Mobility Overal bed mobility: Needs Assistance Bed Mobility: Rolling Rolling: Mod assist Sidelying to sit: Mod assist;HOB elevated       General bed mobility comments: to R EOB, pt need max  multimodal cues for technique and increased time to perform  Transfers Overall transfer level: Needs assistance Equipment used: Rolling walker (2 wheeled) Transfers: Sit to/from Stand Sit to Stand: Mod assist         General transfer comment: from EOB, BSC heights to RW and RW to BSC/chair  Ambulation/Gait Ambulation/Gait assistance: Min assist Gait Distance (Feet): 5 Feet (69ft pivoting to BSC, then 63ft to chair from [facility]) Assistive device: Rolling walker (2 wheeled) Gait Pattern/deviations: Decreased step length - right;Decreased step length - left;Step-to pattern (downward gaze; decreased foot clearance bilaterally) Gait velocity: decreased   General Gait Details: Short steps with heavy dependence on UE support on RW   Stairs             Wheelchair Mobility    Modified Rankin (Stroke Patients Only)       Balance Overall balance assessment: Needs assistance Sitting-balance support: Bilateral upper extremity supported;Feet supported Sitting balance-Leahy Scale: Poor Sitting balance - Comments: requires BUEs for sitting balance   Standing balance support: Bilateral upper extremity supported Standing balance-Leahy Scale: Poor Standing balance comment: Assist with RW and external support for stability                            Cognition Arousal/Alertness: Awake/alert Behavior During Therapy: WFL for tasks assessed/performed;Flat affect Overall Cognitive Status: Difficult to assess                                 General Comments: pt following commands with increased time; 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      Exercises      General Comments General comments (skin  integrity, edema, etc.): pt bed wet upon staff arrival and purewick soiled, mesh briefs with pad donned/doffed x2 during session due to incontinence getting to [facility], NT notified pt needs clean purewick at end of session; daughter in room      Pertinent Vitals/Pain Pain  Assessment: Faces Faces Pain Scale: Hurts even more Pain Location: low back Pain Descriptors / Indicators: Grimacing Pain Intervention(s): Monitored during session;RN gave pain meds during session;Repositioned   Vitals: pt denies dizziness with transfers   10/02/20 0551  BP: 128/65  Pulse: 81  Resp: 17  Temp: 98.1 F (36.7 C)  SpO2: 96%    Home Living                      Prior Function            PT Goals (current goals can now be found in the care plan section) Acute Rehab PT Goals Patient Stated Goal: daughter now reports that they want pt to go to SNF for further rehab. PT Goal Formulation: With patient/family Time For Goal Achievement: 10/14/20 Potential to Achieve Goals: Good Progress towards PT goals: Progressing toward goals (slow progress)    Frequency    Min 3X/week      PT Plan Discharge plan needs to be updated;Frequency needs to be updated    Co-evaluation              AM-PAC PT "6 Clicks" Mobility   Outcome Measure  Help needed turning from your back to your side while in a flat bed without using bedrails?: A Lot Help needed moving from lying on your back to sitting on the side of a flat bed without using bedrails?: A Lot Help needed moving to and from a bed to a chair (including a wheelchair)?: A Little Help needed standing up from a chair using your arms (e.g., wheelchair or bedside chair)?: A Lot Help needed to walk in hospital room?: A Little Help needed climbing 3-5 steps with a railing? : Total 6 Click Score: 13    End of Session Equipment Utilized During Treatment: Back brace;Gait belt Activity Tolerance: Patient limited by pain Patient left: in chair;with call bell/phone within reach;with chair alarm set;with family/visitor present Nurse Communication: Mobility status;Precautions PT Visit Diagnosis: Unsteadiness on feet (R26.81);Other abnormalities of gait and mobility (R26.89);Pain Pain - part of body:  (back)      Time: 1779-3903 PT Time Calculation (min) (ACUTE ONLY): 49 min  Charges:  $Gait Training: 8-22 mins $Therapeutic Activity: 23-37 mins                     Jackquline Branca P., PTA Acute Rehabilitation Services Pager: [phone] Office: [place] 10/02/2020, 10:58 AM

## 2020-10-02 NOTE — Progress Notes (Signed)
PROGRESS NOTE  Gagandeep Kossman KWI:[id number] DOB: [birth year]   PCP: [name], DO  Patient is from: Home.  Lives with daughter.  Uses walker at baseline.  DOA: 09/27/2020 LOS: 5  Chief complaints: Fall.  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  In ED, slightly hypertensive.  Labs without significant finding.  CT head without acute finding.  CT abdomen and pelvis raise concern for mild L1 compression fracture.  Neurosurgery consulted and recommended lumbar MRI.  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  Neurosurgery recommended pain control, TLSO brace, PT/OT and outpatient follow-up in 4 weeks.   Patient had dysuria with new low back pain. Started on ceftriaxone. Urinalysis without significant finding. Urine culture with pansensitive E. Coli.  Plan is to discharge to SNF once bed available.  Subjective: Seen and examined earlier this morning. No major events overnight of this morning. She had good bowel movement after enema yesterday. She reported 9/10 pain last night that has eased off to 4/10 earlier this morning. However, her pain back up to 8/10 later this morning. Pain is worse with movement. She denies chest pain, dyspnea, GI or UTI symptoms.  Objective: Vitals:   10/01/20 0507 10/01/20 1433 10/01/20 2032 10/02/20 0551  BP: 129/74 (!) 161/79 130/66 128/65  Pulse: 99 81 80 81  Resp: 19 18 17 17   Temp: 97.9 F (36.6 C) 98.1 F (36.7 C) 98.1 F (36.7 C) 98.1 F (36.7 C)  TempSrc: Oral Oral Oral Oral  SpO2: 99% 96% 95% 96%  Weight:      Height:        Intake/Output Summary (Last 24 hours) at 10/02/2020  1038 Last data filed at 10/02/2020 0245 Gross per 24 hour  Intake 75 ml  Output 450 ml  Net -375 ml   Filed Weights   09/27/20 2100  Weight: 93.5 kg    Examination:  GENERAL: No apparent distress.  Nontoxic. HEENT: MMM.  Vision and hearing grossly intact.  NECK: Supple.  No apparent JVD.  RESP: On RA. No IWOB.  Fair aeration bilaterally. CVS:  RRR. Heart sounds normal.  ABD/GI/GU: BS+. Abd soft, NTND.  MSK/EXT:  Moves extremities. Tenderness over lumbar sacral areas bilaterally SKIN: no apparent skin lesion or wound NEURO: Awake, alert and oriented appropriately. Motor 4/5 in BLE. Sensory and reflexes grossly intact. PSYCH: Calm. Normal affect.   Procedures:  None  Microbiology summarized: MEQAS-34 and influenza PCR nonreactive.  Assessment & Plan: Accidental fall at home-no syncope or presyncope.  He had a headache but no LOS.  CTH negative.  T12 compression fracture with about 10% height loss Possible T11 compression fracture Possible small epidural hemorrhage Vitamin D deficiency-vitamin D level 11.48. Lower back pain due to the above -Neurosurgery recs:pain control, TLSO brace, PT/OT and outpatient f/up in 4 weeks -Pain control with scheduled Tylenol, as needed Robaxin, tramadol and IV morphine -P.o. vitamin D 50,000 units weekly.  Add calcium supplementation as well -Bowel regimen-scheduled Colace, as needed MiraLAX, Senokot-S and mag citrate -Final disposition likely SNF.  Neuropathy/possible radiculopathy Severe facet arthropathy at L3-4, L4-5 and L5-S1 Broad disc bulge with severe canal stenosis at L3-4 and L4-5 -No signs of cauda equina or red  flags. -Continue Lyrica and other pain meds as above -Outpatient follow-up with neurosurgery in 4 weeks -Continue PT/OT  E. coli UTI/possible pyelonephritis: had dysuria with new back pain. CVA tenderness but but difficult to tell if this is related to pyelonephritis or musculoskeletal as she is also tender to  palpation. Urine culture with pansensitive E. coli.  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  Has been on Eliquis for about a year. -Continue home Eliquis-resumed on 12/7  Hypothyroidism: TSH 5.341. -Continue home Synthroid  Essential hypertension: Normotensive.  Only on Lasix at home. -Continue holding Lasix -As needed hydralazine  Normocytic anemia/iron deficiency anemia: Hgb down about 2 g since admit.  Likely dilutional from IV fluid.  Denies GI bleed.  Anemia panel shows some degree of iron deficiency. -P.o. ferrous sulfate with bowel regimen -Monitor H&H  Debility/physical deconditioning: Ambulates with walker at baseline.  Independent for ADLs.  Now requiring moderate assist for bed mobility and transfers.  Daughter concerned about ability to provide the support at home.  Therapy recommended SNF. -Continue PT/OT  Thrombocytopenia: Platelet 129> 125.  Relatively stable. -Continue monitoring  Constipation: Resolved with Fleet enema. -Management as above.  Body mass index is 36.51 kg/m.         DVT prophylaxis:   apixaban (ELIQUIS) tablet 5 mg  Code Status: DNR/DNI Family Communication: Updated patient's daughter at bedside Status is: Inpatient  Remains inpatient appropriate because:Unsafe d/c plan   Dispo: The patient is from: Home              Anticipated d/c is to: SNF               Anticipated d/c date is: 3 days              Patient currently is medically stable to d/c.       Consultants:  Neurosurgery-signed off   Sch Meds:  Scheduled Meds: . acetaminophen  1,000 mg Oral Q8H  . apixaban  5 mg Oral BID  . docusate sodium  100 mg Oral BID  . levothyroxine  75 mcg Oral QAC breakfast  . pantoprazole  40 mg Oral Daily  . pregabalin  75 mg Oral BID  . Vitamin D (Ergocalciferol)  50,000 Units Oral Q7 days   Continuous Infusions: . cefTRIAXone (ROCEPHIN)  IV 1 g (10/01/20 1642)    PRN Meds:.hydrALAZINE, magnesium citrate, methocarbamol, morphine injection, ondansetron **OR** ondansetron (ZOFRAN) IV, polyethylene glycol, polyvinyl alcohol, senna-docusate, traMADol  Antimicrobials: Anti-infectives (From admission, onward)   Start     Dose/Rate Route Frequency Ordered Stop   09/29/20 1600  cefTRIAXone (ROCEPHIN) 1 g in sodium chloride 0.9 % 100 mL IVPB        1 g 200 mL/hr over 30 Minutes Intravenous Every 24 hours 09/29/20 1503         I have personally reviewed the following labs and images: CBC: Recent Labs  Lab 09/27/20 0621 09/28/20 0342 09/29/20 0504 09/30/20 0054  WBC 5.7 5.9 6.0 5.1  NEUTROABS 3.2  --   --   --   HGB 12.5 11.9* 10.7* 10.9*  HCT 37.5 34.3* 32.9* 33.7*  MCV 86.0 83.9 85.9 85.3  PLT 149* 141* 129* 125*   BMP &GFR Recent Labs  Lab 09/27/20 0621 09/28/20 0342 09/30/20 0054  NA 141 140 139  K 3.7 3.9 3.9  CL 108 107 104  CO2 24 25 28   GLUCOSE 116* 101* 110*  BUN 13 10 10   CREATININE 0.77 0.68 0.67  CALCIUM 9.2 8.8* 8.5*  MG  --   --  1.9  PHOS  --   --  2.9   Estimated Creatinine Clearance: 49.7 mL/min (by C-G formula based on SCr of 0.67 mg/dL). Liver & Pancreas: Recent Labs  Lab 09/27/20 0621 09/30/20 0054  AST 20  --   ALT 16  --   ALKPHOS 93  --   BILITOT 0.5  --   PROT 6.5  --   ALBUMIN 3.3* 2.6*   No results for input(s): LIPASE, AMYLASE in the last 168 hours. No results for input(s): AMMONIA in the last 168 hours. Diabetic: No results for input(s): HGBA1C in the last 72 hours. No results for input(s): GLUCAP in the last 168 hours. Cardiac Enzymes: No results for input(s): CKTOTAL, CKMB, CKMBINDEX, TROPONINI in the last 168 hours. No results for input(s): PROBNP in the last 8760 hours. Coagulation Profile: No results for input(s): INR, PROTIME in the last 168 hours. Thyroid Function Tests: No results for input(s): TSH, T4TOTAL, FREET4, T3FREE, THYROIDAB in the last 72 hours. Lipid Profile: No  results for input(s): CHOL, HDL, LDLCALC, TRIG, CHOLHDL, LDLDIRECT in the last 72 hours. Anemia Panel: Recent Labs    09/30/20 0054  VITAMINB12 241  FOLATE 12.0  FERRITIN 44  TIBC 235*  IRON 24*  RETICCTPCT 1.3   Urine analysis:    Component Value Date/Time   COLORURINE YELLOW 09/29/2020 1502   APPEARANCEUR CLEAR 09/29/2020 1502   LABSPEC 1.009 09/29/2020 1502   PHURINE 5.0 09/29/2020 1502   GLUCOSEU NEGATIVE 09/29/2020 1502   HGBUR SMALL (A) 09/29/2020 1502   BILIRUBINUR NEGATIVE 09/29/2020 1502   KETONESUR NEGATIVE 09/29/2020 1502   PROTEINUR NEGATIVE 09/29/2020 1502   NITRITE NEGATIVE 09/29/2020 1502   LEUKOCYTESUR NEGATIVE 09/29/2020 1502   Sepsis Labs: Invalid input(s): PROCALCITONIN, [place]  Microbiology: Recent Results (from the past 240 hour(s))  Resp Panel by RT-PCR (Flu A&B, Covid) Nasopharyngeal Swab     Status: None   Collection Time: 09/27/20  1:54 PM   Specimen: Nasopharyngeal Swab; Nasopharyngeal(NP) swabs in vial transport medium  Result Value Ref Range Status   SARS Coronavirus 2 by RT PCR NEGATIVE NEGATIVE Final    Comment: (NOTE) SARS-CoV-2 target nucleic acids are NOT DETECTED.  The SARS-CoV-2 RNA is generally detectable in upper respiratory specimens during the acute phase of infection. The lowest concentration of SARS-CoV-2 viral copies this assay can detect is 138 copies/mL. A negative result does not preclude SARS-Cov-2 infection and should not be used as the sole basis for treatment or other patient management decisions. A negative result may occur with  improper specimen collection/handling, submission of specimen other than nasopharyngeal swab, presence of viral mutation(s) within the areas targeted by this assay, and inadequate number of viral copies(<138 copies/mL). A negative result must be combined with clinical observations, patient history, and epidemiological information. The expected result is Negative.  Fact Sheet for  Patients:  EntrepreneurPulse.com.au  Fact Sheet for Healthcare Providers:  IncredibleEmployment.be  This test is no t yet approved or cleared by the [place] FDA and  has been authorized for detection and/or diagnosis of SARS-CoV-2 by FDA under an Emergency Use Authorization (EUA). This EUA will remain  in effect (meaning this test can be used) for the duration of the COVID-19 declaration under Section 564(b)(1) of the Act, 21 U.S.C.section 360bbb-3(b)(1), unless the authorization is terminated  or revoked sooner.       Influenza A  by PCR NEGATIVE NEGATIVE Final   Influenza B by PCR NEGATIVE NEGATIVE Final    Comment: (NOTE) The Xpert Xpress SARS-CoV-2/FLU/RSV plus assay is intended as an aid in the diagnosis of influenza from Nasopharyngeal swab specimens and should not be used as a sole basis for treatment. Nasal washings and aspirates are unacceptable for Xpert Xpress SARS-CoV-2/FLU/RSV testing.  Fact Sheet for Patients: EntrepreneurPulse.com.au  Fact Sheet for Healthcare Providers: IncredibleEmployment.be  This test is not yet approved or cleared by the [place] FDA and has been authorized for detection and/or diagnosis of SARS-CoV-2 by FDA under an Emergency Use Authorization (EUA). This EUA will remain in effect (meaning this test can be used) for the duration of the COVID-19 declaration under Section 564(b)(1) of the Act, 21 U.S.C. section 360bbb-3(b)(1), unless the authorization is terminated or revoked.  Performed at [facility], [place] [address] [place], [place] [address]   Culture, Urine     Status: Abnormal   Collection Time: 09/29/20  3:02 PM   Specimen: Urine, Catheterized  Result Value Ref Range Status   Specimen Description URINE, CATHETERIZED  Final   Special Requests   Final    NONE Performed at [facility], [place] [address] [place], [place] [address]     Culture >=100,000 COLONIES/mL ESCHERICHIA COLI (A)  Final   Report Status 10/02/2020 FINAL  Final   Organism ID, Bacteria ESCHERICHIA COLI (A)  Final      Susceptibility   Escherichia coli - MIC*    AMPICILLIN 4 SENSITIVE Sensitive     CEFAZOLIN <=4 SENSITIVE Sensitive     CEFEPIME <=0.12 SENSITIVE Sensitive     CEFTRIAXONE <=0.25 SENSITIVE Sensitive     CIPROFLOXACIN <=0.25 SENSITIVE Sensitive     GENTAMICIN <=1 SENSITIVE Sensitive     IMIPENEM <=0.25 SENSITIVE Sensitive     NITROFURANTOIN <=16 SENSITIVE Sensitive     TRIMETH/SULFA <=20 SENSITIVE Sensitive     AMPICILLIN/SULBACTAM <=2 SENSITIVE Sensitive     PIP/TAZO <=4 SENSITIVE Sensitive     * >=100,000 COLONIES/mL ESCHERICHIA COLI    Radiology Studies: No results found.   Damisha Wolff T. No Name  If 7PM-7AM, please contact night-coverage [email or web address] 10/02/2020, 10:38 AM

## 2020-10-03 LAB — RENAL FUNCTION PANEL
Albumin: 2.7 g/dL — ABNORMAL LOW (ref 3.5–5.0)
Anion gap: 8 (ref 5–15)
BUN: 12 mg/dL (ref 8–23)
CO2: 26 mmol/L (ref 22–32)
Calcium: 8.8 mg/dL — ABNORMAL LOW (ref 8.9–10.3)
Chloride: 106 mmol/L (ref 98–111)
Creatinine, Ser: 0.78 mg/dL (ref 0.44–1.00)
GFR, Estimated: >60 mL/min (ref >60)
Glucose, Bld: 94 mg/dL (ref 70–99)
Phosphorus: 3.7 mg/dL (ref 2.5–4.6)
Potassium: 4.5 mmol/L (ref 3.5–5.1)
Sodium: 140 mmol/L (ref 135–145)

## 2020-10-03 LAB — CBC
HCT: 33.3 % — ABNORMAL LOW (ref 36.0–46.0)
Hemoglobin: 10.7 g/dL — ABNORMAL LOW (ref 12.0–15.0)
MCH: 27.5 pg (ref 26.0–34.0)
MCHC: 32.1 g/dL (ref 30.0–36.0)
MCV: 85.6 fL (ref 80.0–100.0)
Platelets: 146 10*3/uL — ABNORMAL LOW (ref 150–400)
RBC: 3.89 MIL/uL (ref 3.87–5.11)
RDW: 14 % (ref 11.5–15.5)
WBC: 4.5 10*3/uL (ref 4.0–10.5)
nRBC: 0 % (ref 0.0–0.2)

## 2020-10-03 LAB — MAGNESIUM: Magnesium: 2.5 mg/dL — ABNORMAL HIGH (ref 1.7–2.4)

## 2020-10-03 NOTE — TOC Progression Note (Addendum)
Transition of Care [facility]) - Progression Note    Patient Details  Name: [name] MRN: [id number] Date of Birth: 1927/09/24  Transition of Care [facility] CM/SW Contact  Jacalyn Lefevre Edson Snowball, RN Phone Number: 10/03/2020, 10:14 AM  Clinical Narrative:     Followed up with SNF's daughter called about last evening. [facility] made an offer. Provided patient and daughter [name] with medicare.gov ratings for [facility], they will discuss and let NCM know   [facility] called NCM and they have decided on [facility]. Called Tracy at [facility] , pending insurance auth they can accept on Monday. WIll need covid test within 24 hours of discharge.   Spoke to Loretto at [facility] , she can accept patient Monday October 05, 2020 will need covid swab within 24 hours of discharge.  Insurance Auth [id number] beginning December 13 to October 08, 2020. MD aware.   Patient's daughter [name] aware and would Olivia Mackie at [facility] to call her Monday ( sent message to [place]). Explained to [name] , patient will need covid swab within 24 hours of discharge and patient will transport via ambulance with [facility]  Patient will need DNR form signed.  Expected Discharge Plan: [place]    Expected Discharge Plan and Services Expected Discharge Plan: [place]   Discharge Planning Services: CM Consult Post Acute Care Choice: Home Health,Durable Medical Equipment Living arrangements for the past 2 months: Single Family Home Expected Discharge Date: 10/01/20               DME Arranged: 3-N-1,Walker rolling,Wheelchair manual DME Agency: [employer] Date DME Agency Contacted: 10/01/20 Time DME Agency Contacted: [phone] Representative spoke with at DME Agency: [place] Arranged: PT,OT,Nurse's Aide           Social Determinants of Health (SDOH) Interventions    Readmission Risk Interventions No flowsheet data found.

## 2020-10-03 NOTE — Progress Notes (Signed)
PROGRESS NOTE  [name] ZOX:[id number] DOB: 02/22/1927   PCP: [name], DO  Patient is from: Home.  Lives with daughter.  Uses walker at baseline.  DOA: 09/27/2020 LOS: 6  Chief complaints: Fall.  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  In ED, slightly hypertensive.  Labs without significant finding.  CT head without acute finding.  CT abdomen and pelvis raise concern for mild L1 compression fracture.  Neurosurgery consulted and recommended lumbar MRI.  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  Neurosurgery recommended pain control, TLSO brace, PT/OT and outpatient follow-up in 4 weeks.   Patient had dysuria with new low back pain. Started on ceftriaxone. Urinalysis without significant finding. Urine culture with pansensitive E. Coli.  Plan is to discharge to SNF likely on 10/05/2020 after insurance authorization.  Patient is medically ready.  Subjective: Seen and examined earlier this morning.  No major events overnight.  She says she was scared when someone walked in this morning and jumped which caused her severe back pain.  She rates her pain 10/10 in her lower back.  No radiation to her legs.  No new focal neuro symptoms.  She has not gotten pain medication yet. Objective: Vitals:   10/02/20 0551 10/02/20 1321 10/02/20 2127 10/03/20 0441  BP: 128/65 114/79 (!) 107/97 128/69  Pulse: 81 81 81 77  Resp: 17 17 15 15   Temp: 98.1 F (36.7 C) 97.7 F (36.5 C) 97.8 F (36.6 C) 98 F (36.7 C)  TempSrc: Oral Oral  Oral  SpO2: 96% 96% 99% 96%  Weight:      Height:        Intake/Output Summary (Last 24  hours) at 10/03/2020 1455 Last data filed at 10/03/2020 1319 Gross per 24 hour  Intake 240 ml  Output 1000 ml  Net -760 ml   Filed Weights   09/27/20 2100  Weight: 93.5 kg    Examination:  GENERAL: Appears to be in pain. HEENT: MMM.  Vision and hearing grossly intact.  NECK: Supple.  No apparent JVD.  RESP: On RA.  No IWOB.  Fair aeration bilaterally. CVS:  RRR. Heart sounds normal.  ABD/GI/GU: BS+. Abd soft, NTND.  MSK/EXT:  Moves extremities. No apparent deformity. No edema.  SKIN: no apparent skin lesion or wound NEURO: Awake, alert and oriented appropriately.  Motor 3+/5 in BLE with hip flexion.  Sensory and reflex intact. PSYCH: Calm. Normal affect.   Procedures:  None  Microbiology summarized: [id number] and influenza PCR nonreactive.  Assessment & Plan: Accidental fall at home-no syncope or presyncope.  He had a headache but no LOS.  CTH negative.  T12 compression fracture with about 10% height loss Possible T11 compression fracture Possible small epidural hemorrhage Vitamin D deficiency-vitamin D level 11.48. Lower back pain due to the above -Neurosurgery recs:pain control, TLSO brace, PT/OT and outpatient f/up in 4 weeks -Pain control with scheduled Tylenol, as needed Robaxin, tramadol and IV morphine -P.o. vitamin D 50,000 units weekly.  Add calcium supplementation as well -Bowel regimen-scheduled Colace, as needed MiraLAX, Senokot-S and mag citrate -Final disposition SNF.  Neuropathy/possible radiculopathy Severe facet arthropathy at L3-4, L4-5 and L5-S1 Broad disc bulge with severe canal  stenosis at L3-4 and L4-5 -No signs of cauda equina or red flags. -Continue Lyrica and other pain meds as above -Outpatient follow-up with neurosurgery in 4 weeks -Continue PT/OT  E. coli UTI/possible pyelonephritis: had dysuria with new back pain. CVA tenderness but but difficult to tell if this is related to pyelonephritis or musculoskeletal as she is also tender to  palpation. Urine culture with pansensitive E. coli.  -Ceftriaxone 12/7>> Ancef 12/11>> to complete 10 days course.  Can complete course with Keflex.  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  Has been on Eliquis for about a year. -Continue home Eliquis-resumed on 12/7  Hypothyroidism: TSH 5.341. -Continue home Synthroid  Essential hypertension: Normotensive.  Only on Lasix at home. -Continue holding Lasix -As needed hydralazine  Normocytic anemia/iron deficiency anemia: Hgb down about 2 g since admit.  Likely dilutional from IV fluid.  Denies GI bleed.  Anemia panel shows some degree of iron deficiency. -P.o. ferrous sulfate with bowel regimen -Monitor H&H  Debility/physical deconditioning: Ambulates with walker at baseline.  Independent for ADLs.  Now requiring moderate assist for bed mobility and transfers.  Daughter concerned about ability to provide the support at home.  Therapy recommended SNF. -Continue PT/OT  Thrombocytopenia: Platelet 129> 125> 145.  Relatively stable. -Continue monitoring  Constipation: Resolved with Fleet enema. -Management as above.  Class II obesity Body mass index is 36.51 kg/m.         DVT prophylaxis:   apixaban (ELIQUIS) tablet 5 mg  Code Status: DNR/DNI Family Communication: Updated patient's daughter over the phone Status is: Inpatient  Remains inpatient appropriate because:Unsafe d/c plan   Dispo: The patient is from: Home              Anticipated d/c is to: SNF               Anticipated d/c date is: 2 days              Patient currently is medically stable to d/c.    Consultants:  Neurosurgery-signed off   Sch Meds:  Scheduled Meds: . acetaminophen  1,000 mg Oral Q8H  . apixaban  5 mg Oral BID  . docusate sodium  100 mg Oral BID  . levothyroxine  75 mcg Oral QAC breakfast  . pantoprazole  40 mg Oral Daily  . pregabalin  75 mg Oral BID  . Vitamin D (Ergocalciferol)  50,000 Units Oral Q7 days   Continuous  Infusions: .  ceFAZolin (ANCEF) IV     PRN Meds:.hydrALAZINE, magnesium citrate, methocarbamol, morphine injection, ondansetron **OR** ondansetron (ZOFRAN) IV, polyethylene glycol, polyvinyl alcohol, senna-docusate, traMADol  Antimicrobials: Anti-infectives (From admission, onward)   Start     Dose/Rate Route Frequency Ordered Stop   10/03/20 1400  ceFAZolin (ANCEF) IVPB 1 g/50 mL premix        1 g 100 mL/hr over 30 Minutes Intravenous Every 8 hours 10/02/20 2027 10/10/20 0559   09/29/20 1600  cefTRIAXone (ROCEPHIN) 1 g in sodium chloride 0.9 % 100 mL IVPB  Status:  Discontinued        1 g 200 mL/hr over 30 Minutes Intravenous Every 24 hours 09/29/20 1503 10/02/20 2027       I have personally reviewed the following labs and images: CBC: Recent Labs  Lab 09/27/20 0621 09/28/20 0342 09/29/20 0504 09/30/20 0054 10/03/20 0400  WBC 5.7 5.9 6.0 5.1 4.5  NEUTROABS 3.2  --   --   --   --   HGB  12.5 11.9* 10.7* 10.9* 10.7*  HCT 37.5 34.3* 32.9* 33.7* 33.3*  MCV 86.0 83.9 85.9 85.3 85.6  PLT 149* 141* 129* 125* 146*   BMP &GFR Recent Labs  Lab 09/27/20 0621 09/28/20 0342 09/30/20 0054 10/03/20 0400  NA 141 140 139 140  K 3.7 3.9 3.9 4.5  CL 108 107 104 106  CO2 24 25 28 26   GLUCOSE 116* 101* 110* 94  BUN 13 10 10 12   CREATININE 0.77 0.68 0.67 0.78  CALCIUM 9.2 8.8* 8.5* 8.8*  MG  --   --  1.9 2.5*  PHOS  --   --  2.9 3.7   Estimated Creatinine Clearance: 49.7 mL/min (by C-G formula based on SCr of 0.78 mg/dL). Liver & Pancreas: Recent Labs  Lab 09/27/20 0621 09/30/20 0054 10/03/20 0400  AST 20  --   --   ALT 16  --   --   ALKPHOS 93  --   --   BILITOT 0.5  --   --   PROT 6.5  --   --   ALBUMIN 3.3* 2.6* 2.7*   No results for input(s): LIPASE, AMYLASE in the last 168 hours. No results for input(s): AMMONIA in the last 168 hours. Diabetic: No results for input(s): HGBA1C in the last 72 hours. No results for input(s): GLUCAP in the last 168 hours. Cardiac  Enzymes: No results for input(s): CKTOTAL, CKMB, CKMBINDEX, TROPONINI in the last 168 hours. No results for input(s): PROBNP in the last 8760 hours. Coagulation Profile: No results for input(s): INR, PROTIME in the last 168 hours. Thyroid Function Tests: No results for input(s): TSH, T4TOTAL, FREET4, T3FREE, THYROIDAB in the last 72 hours. Lipid Profile: No results for input(s): CHOL, HDL, LDLCALC, TRIG, CHOLHDL, LDLDIRECT in the last 72 hours. Anemia Panel: No results for input(s): VITAMINB12, FOLATE, FERRITIN, TIBC, IRON, RETICCTPCT in the last 72 hours. Urine analysis:    Component Value Date/Time   COLORURINE YELLOW 09/29/2020 1502   APPEARANCEUR CLEAR 09/29/2020 1502   LABSPEC 1.009 09/29/2020 1502   PHURINE 5.0 09/29/2020 1502   GLUCOSEU NEGATIVE 09/29/2020 1502   HGBUR SMALL (A) 09/29/2020 1502   BILIRUBINUR NEGATIVE 09/29/2020 1502   KETONESUR NEGATIVE 09/29/2020 1502   PROTEINUR NEGATIVE 09/29/2020 1502   NITRITE NEGATIVE 09/29/2020 1502   LEUKOCYTESUR NEGATIVE 09/29/2020 1502   Sepsis Labs: Invalid input(s): PROCALCITONIN, [place]  Microbiology: Recent Results (from the past 240 hour(s))  Resp Panel by RT-PCR (Flu A&B, Covid) Nasopharyngeal Swab     Status: None   Collection Time: 09/27/20  1:54 PM   Specimen: Nasopharyngeal Swab; Nasopharyngeal(NP) swabs in vial transport medium  Result Value Ref Range Status   SARS Coronavirus 2 by RT PCR NEGATIVE NEGATIVE Final    Comment: (NOTE) SARS-CoV-2 target nucleic acids are NOT DETECTED.  The SARS-CoV-2 RNA is generally detectable in upper respiratory specimens during the acute phase of infection. The lowest concentration of SARS-CoV-2 viral copies this assay can detect is 138 copies/mL. A negative result does not preclude SARS-Cov-2 infection and should not be used as the sole basis for treatment or other patient management decisions. A negative result may occur with  improper specimen collection/handling,  submission of specimen other than nasopharyngeal swab, presence of viral mutation(s) within the areas targeted by this assay, and inadequate number of viral copies(<138 copies/mL). A negative result must be combined with clinical observations, patient history, and epidemiological information. The expected result is Negative.  Fact Sheet for Patients:  EntrepreneurPulse.com.au  Fact Sheet for  Healthcare Providers:  IncredibleEmployment.be  This test is no t yet approved or cleared by the [place] and  has been authorized for detection and/or diagnosis of SARS-CoV-2 by FDA under an Emergency Use Authorization (EUA). This EUA will remain  in effect (meaning this test can be used) for the duration of the COVID-19 declaration under Section 564(b)(1) of the Act, 21 U.S.C.section 360bbb-3(b)(1), unless the authorization is terminated  or revoked sooner.       Influenza A by PCR NEGATIVE NEGATIVE Final   Influenza B by PCR NEGATIVE NEGATIVE Final    Comment: (NOTE) The Xpert Xpress SARS-CoV-2/FLU/RSV plus assay is intended as an aid in the diagnosis of influenza from Nasopharyngeal swab specimens and should not be used as a sole basis for treatment. Nasal washings and aspirates are unacceptable for Xpert Xpress SARS-CoV-2/FLU/RSV testing.  Fact Sheet for Patients: EntrepreneurPulse.com.au  Fact Sheet for Healthcare Providers: IncredibleEmployment.be  This test is not yet approved or cleared by the [place] FDA and has been authorized for detection and/or diagnosis of SARS-CoV-2 by FDA under an Emergency Use Authorization (EUA). This EUA will remain in effect (meaning this test can be used) for the duration of the COVID-19 declaration under Section 564(b)(1) of the Act, 21 U.S.C. section 360bbb-3(b)(1), unless the authorization is terminated or revoked.  Performed at [facility], [place] [address], [place], [place] [address]   Culture, Urine     Status: Abnormal   Collection Time: 09/29/20  3:02 PM   Specimen: Urine, Catheterized  Result Value Ref Range Status   Specimen Description URINE, CATHETERIZED  Final   Special Requests   Final    NONE Performed at [facility], [place] [address], [place], [place] [address]    Culture >=100,000 COLONIES/mL ESCHERICHIA COLI (A)  Final   Report Status 10/02/2020 FINAL  Final   Organism ID, Bacteria ESCHERICHIA COLI (A)  Final      Susceptibility   Escherichia coli - MIC*    AMPICILLIN 4 SENSITIVE Sensitive     CEFAZOLIN <=4 SENSITIVE Sensitive     CEFEPIME <=0.12 SENSITIVE Sensitive     CEFTRIAXONE <=0.25 SENSITIVE Sensitive     CIPROFLOXACIN <=0.25 SENSITIVE Sensitive     GENTAMICIN <=1 SENSITIVE Sensitive     IMIPENEM <=0.25 SENSITIVE Sensitive     NITROFURANTOIN <=16 SENSITIVE Sensitive     TRIMETH/SULFA <=20 SENSITIVE Sensitive     AMPICILLIN/SULBACTAM <=2 SENSITIVE Sensitive     PIP/TAZO <=4 SENSITIVE Sensitive     * >=100,000 COLONIES/mL ESCHERICHIA COLI    Radiology Studies: No results found.   Karmel Patricelli T. [place]  If 7PM-7AM, please contact night-coverage [email or web address] 10/03/2020, 2:55 PM

## 2020-10-04 LAB — SARS CORONAVIRUS 2 BY RT PCR (HOSPITAL ORDER, PERFORMED IN ~~LOC~~ HOSPITAL LAB): SARS Coronavirus 2: NEGATIVE

## 2020-10-04 MED ORDER — OXYCODONE HCL 5 MG PO TABS: 5.0000 mg | ORAL_TABLET | Freq: Four times a day (QID) | ORAL | Status: DC | PRN

## 2020-10-04 NOTE — Progress Notes (Signed)
PROGRESS NOTE  [name] DHR:[id number] DOB: 1927-06-06   PCP: [name], DO  Patient is from: Home.  Lives with daughter.  Uses walker at baseline.  DOA: 09/27/2020 LOS: 7  Chief complaints: Fall.  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  In ED, slightly hypertensive.  Labs without significant finding.  CT head without acute finding.  CT abdomen and pelvis raise concern for mild L1 compression fracture.  Neurosurgery consulted and recommended lumbar MRI.  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  Neurosurgery recommended pain control, TLSO brace, PT/OT and outpatient follow-up in 4 weeks.   Patient had dysuria with new low back pain. Started on ceftriaxone. Urinalysis without significant finding. Urine culture with pansensitive E. Coli.  Plan is to discharge to SNF likely on 10/05/2020 after insurance authorization.  Patient is medically ready.  Subjective: Seen and examined earlier this morning.  No major events overnight or this morning.  No complaints.  Pain fairly controlled.  She says she is urinating a lot.  Denies dysuria or urgency.  No bowel movements in 2 days since enema.  Daughter at bedside.  Would like to retry oxycodone instead of IV morphine for severe pain  Objective: Vitals:   10/03/20 1506 10/03/20 1548 10/03/20 2107 10/04/20 1330  BP: (!) 167/67 (!) 167/67 140/72 (!) 152/80  Pulse: 96 96 76 78  Resp: 16 16 17 20   Temp: 98.4 F (36.9 C) 98.4 F (36.9 C) 98 F (36.7 C) 98.3 F (36.8 C)  TempSrc: Oral Oral  Oral  SpO2: 95% 95% 95% 97%  Weight:      Height:        Intake/Output  Summary (Last 24 hours) at 10/04/2020 1338 Last data filed at 10/04/2020 1105 Gross per 24 hour  Intake 880 ml  Output 3250 ml  Net -2370 ml   Filed Weights   09/27/20 2100  Weight: 93.5 kg    Examination:  GENERAL: No apparent distress.  Nontoxic. HEENT: MMM.  Vision and hearing grossly intact.  NECK: Supple.  No apparent JVD.  RESP: On RA.  No IWOB.  Fair aeration bilaterally. CVS:  RRR. Heart sounds normal.  ABD/GI/GU: BS+. Abd soft, NTND.  MSK/EXT:  Moves extremities. No apparent deformity. No edema.  SKIN: no apparent skin lesion or wound NEURO: Awake, alert and oriented.  Motor 3+/5 in BLE with hip flexion.  Sensory and reflex intact. PSYCH: Calm. Normal affect.  Procedures:  None  Microbiology summarized: IWOEH-21 and influenza PCR nonreactive.  Assessment & Plan: Accidental fall at home-no syncope or presyncope.  He had a headache but no LOS.  CTH negative.  T12 compression fracture with about 10% height loss Possible T11 compression fracture Possible small epidural hemorrhage Vitamin D deficiency-vitamin D level 11.48. Lower back pain due to the above -Neurosurgery recs:pain control, TLSO brace, PT/OT and outpatient f/up in 4 weeks -Pain control with scheduled Tylenol, as needed Robaxin, tramadol and oxycodone -P.o. vitamin D 50,000 units weekly.  Add calcium supplementation as well -Bowel regimen-scheduled Colace, as needed MiraLAX, Senokot-S and mag citrate -Final disposition SNF.  Neuropathy/possible radiculopathy Severe facet arthropathy at L3-4, L4-5 and L5-S1 Broad disc bulge with severe canal stenosis  at L3-4 and L4-5 -No signs of cauda equina or red flags. -Continue Lyrica and other pain meds as above -Outpatient follow-up with neurosurgery in 4 weeks -Continue PT/OT  E. coli UTI/possible pyelonephritis: had dysuria with new back pain. CVA tenderness but but difficult to tell if this is related to pyelonephritis or musculoskeletal as she is also  tender to palpation. Urine culture with pansensitive E. coli.  -Ceftriaxone 12/7>> Ancef 12/11>> to complete 10 days course.  Can complete course with Keflex.  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  Has been on Eliquis for about a year. -Continue home Eliquis-resumed on 12/7  Hypothyroidism: TSH 5.341. -Continue home Synthroid  Essential hypertension: Normotensive.  Only on Lasix at home. -Continue holding Lasix -As needed hydralazine  Normocytic anemia/iron deficiency anemia: Hgb down about 2 g since admit.  Likely dilutional from IV fluid.  Denies GI bleed.  Anemia panel shows some degree of iron deficiency. -P.o. ferrous sulfate with bowel regimen -Monitor H&H  Debility/physical deconditioning: Ambulates with walker at baseline.  Independent for ADLs.  Now requiring moderate assist for bed mobility and transfers.  Daughter concerned about ability to provide the support at home.  Therapy recommended SNF. -Continue PT/OT  Thrombocytopenia: Platelet 129> 125> 145.  Relatively stable. -Continue monitoring  Constipation:  -Management as above.  Class II obesity Body mass index is 36.51 kg/m.         DVT prophylaxis:   apixaban (ELIQUIS) tablet 5 mg  Code Status: DNR/DNI Family Communication: Updated patient's daughter at bedside. Status is: Inpatient  Remains inpatient appropriate because:Unsafe d/c plan   Dispo: The patient is from: Home              Anticipated d/c is to: SNF               Anticipated d/c date is: 1 day              Patient currently is medically stable to d/c.    Consultants:  Neurosurgery-signed off   Sch Meds:  Scheduled Meds: . acetaminophen  1,000 mg Oral Q8H  . apixaban  5 mg Oral BID  . docusate sodium  100 mg Oral BID  . levothyroxine  75 mcg Oral QAC breakfast  . pantoprazole  40 mg Oral Daily  . pregabalin  75 mg Oral BID  . Vitamin D (Ergocalciferol)  50,000 Units Oral Q7 days   Continuous Infusions: .  ceFAZolin  (ANCEF) IV 1 g (10/04/20 0552)   PRN Meds:.hydrALAZINE, magnesium citrate, methocarbamol, ondansetron **OR** ondansetron (ZOFRAN) IV, oxyCODONE, polyethylene glycol, polyvinyl alcohol, senna-docusate, traMADol  Antimicrobials: Anti-infectives (From admission, onward)   Start     Dose/Rate Route Frequency Ordered Stop   10/03/20 1400  ceFAZolin (ANCEF) IVPB 1 g/50 mL premix        1 g 100 mL/hr over 30 Minutes Intravenous Every 8 hours 10/02/20 2027 10/10/20 0559   09/29/20 1600  cefTRIAXone (ROCEPHIN) 1 g in sodium chloride 0.9 % 100 mL IVPB  Status:  Discontinued        1 g 200 mL/hr over 30 Minutes Intravenous Every 24 hours 09/29/20 1503 10/02/20 2027       I have personally reviewed the following labs and images: CBC: Recent Labs  Lab 09/28/20 0342 09/29/20 0504 09/30/20 0054 10/03/20 0400  WBC 5.9 6.0 5.1 4.5  HGB 11.9* 10.7* 10.9* 10.7*  HCT 34.3* 32.9* 33.7* 33.3*  MCV 83.9 85.9 85.3 85.6  PLT 141* 129* 125* 146*  BMP &GFR Recent Labs  Lab 09/28/20 0342 09/30/20 0054 10/03/20 0400  NA 140 139 140  K 3.9 3.9 4.5  CL 107 104 106  CO2 25 28 26   GLUCOSE 101* 110* 94  BUN 10 10 12   CREATININE 0.68 0.67 0.78  CALCIUM 8.8* 8.5* 8.8*  MG  --  1.9 2.5*  PHOS  --  2.9 3.7   Estimated Creatinine Clearance: 49.7 mL/min (by C-G formula based on SCr of 0.78 mg/dL). Liver & Pancreas: Recent Labs  Lab 09/30/20 0054 10/03/20 0400  ALBUMIN 2.6* 2.7*   No results for input(s): LIPASE, AMYLASE in the last 168 hours. No results for input(s): AMMONIA in the last 168 hours. Diabetic: No results for input(s): HGBA1C in the last 72 hours. No results for input(s): GLUCAP in the last 168 hours. Cardiac Enzymes: No results for input(s): CKTOTAL, CKMB, CKMBINDEX, TROPONINI in the last 168 hours. No results for input(s): PROBNP in the last 8760 hours. Coagulation Profile: No results for input(s): INR, PROTIME in the last 168 hours. Thyroid Function Tests: No results for  input(s): TSH, T4TOTAL, FREET4, T3FREE, THYROIDAB in the last 72 hours. Lipid Profile: No results for input(s): CHOL, HDL, LDLCALC, TRIG, CHOLHDL, LDLDIRECT in the last 72 hours. Anemia Panel: No results for input(s): VITAMINB12, FOLATE, FERRITIN, TIBC, IRON, RETICCTPCT in the last 72 hours. Urine analysis:    Component Value Date/Time   COLORURINE YELLOW 09/29/2020 1502   APPEARANCEUR CLEAR 09/29/2020 1502   LABSPEC 1.009 09/29/2020 1502   PHURINE 5.0 09/29/2020 1502   GLUCOSEU NEGATIVE 09/29/2020 1502   HGBUR SMALL (A) 09/29/2020 1502   BILIRUBINUR NEGATIVE 09/29/2020 1502   KETONESUR NEGATIVE 09/29/2020 1502   PROTEINUR NEGATIVE 09/29/2020 1502   NITRITE NEGATIVE 09/29/2020 1502   LEUKOCYTESUR NEGATIVE 09/29/2020 1502   Sepsis Labs: Invalid input(s): PROCALCITONIN, [place]  Microbiology: Recent Results (from the past 240 hour(s))  Resp Panel by RT-PCR (Flu A&B, Covid) Nasopharyngeal Swab     Status: None   Collection Time: 09/27/20  1:54 PM   Specimen: Nasopharyngeal Swab; Nasopharyngeal(NP) swabs in vial transport medium  Result Value Ref Range Status   SARS Coronavirus 2 by RT PCR NEGATIVE NEGATIVE Final    Comment: (NOTE) SARS-CoV-2 target nucleic acids are NOT DETECTED.  The SARS-CoV-2 RNA is generally detectable in upper respiratory specimens during the acute phase of infection. The lowest concentration of SARS-CoV-2 viral copies this assay can detect is 138 copies/mL. A negative result does not preclude SARS-Cov-2 infection and should not be used as the sole basis for treatment or other patient management decisions. A negative result may occur with  improper specimen collection/handling, submission of specimen other than nasopharyngeal swab, presence of viral mutation(s) within the areas targeted by this assay, and inadequate number of viral copies(<138 copies/mL). A negative result must be combined with clinical observations, patient history, and  epidemiological information. The expected result is Negative.  Fact Sheet for Patients:  EntrepreneurPulse.com.au  Fact Sheet for Healthcare Providers:  IncredibleEmployment.be  This test is no t yet approved or cleared by the [place] FDA and  has been authorized for detection and/or diagnosis of SARS-CoV-2 by FDA under an Emergency Use Authorization (EUA). This EUA will remain  in effect (meaning this test can be used) for the duration of the COVID-19 declaration under Section 564(b)(1) of the Act, 21 U.S.C.section 360bbb-3(b)(1), unless the authorization is terminated  or revoked sooner.       Influenza A by PCR NEGATIVE NEGATIVE Final   Influenza  B by PCR NEGATIVE NEGATIVE Final    Comment: (NOTE) The Xpert Xpress SARS-CoV-2/FLU/RSV plus assay is intended as an aid in the diagnosis of influenza from Nasopharyngeal swab specimens and should not be used as a sole basis for treatment. Nasal washings and aspirates are unacceptable for Xpert Xpress SARS-CoV-2/FLU/RSV testing.  Fact Sheet for Patients: EntrepreneurPulse.com.au  Fact Sheet for Healthcare Providers: IncredibleEmployment.be  This test is not yet approved or cleared by the [place] FDA and has been authorized for detection and/or diagnosis of SARS-CoV-2 by FDA under an Emergency Use Authorization (EUA). This EUA will remain in effect (meaning this test can be used) for the duration of the COVID-19 declaration under Section 564(b)(1) of the Act, 21 U.S.C. section 360bbb-3(b)(1), unless the authorization is terminated or revoked.  Performed at [facility], [place] [address] [place], [place] [address]   Culture, Urine     Status: Abnormal   Collection Time: 09/29/20  3:02 PM   Specimen: Urine, Catheterized  Result Value Ref Range Status   Specimen Description URINE, CATHETERIZED  Final   Special Requests   Final     NONE Performed at [facility], [place] [address], [place], [place] [address]    Culture >=100,000 COLONIES/mL ESCHERICHIA COLI (A)  Final   Report Status 10/02/2020 FINAL  Final   Organism ID, Bacteria ESCHERICHIA COLI (A)  Final      Susceptibility   Escherichia coli - MIC*    AMPICILLIN 4 SENSITIVE Sensitive     CEFAZOLIN <=4 SENSITIVE Sensitive     CEFEPIME <=0.12 SENSITIVE Sensitive     CEFTRIAXONE <=0.25 SENSITIVE Sensitive     CIPROFLOXACIN <=0.25 SENSITIVE Sensitive     GENTAMICIN <=1 SENSITIVE Sensitive     IMIPENEM <=0.25 SENSITIVE Sensitive     NITROFURANTOIN <=16 SENSITIVE Sensitive     TRIMETH/SULFA <=20 SENSITIVE Sensitive     AMPICILLIN/SULBACTAM <=2 SENSITIVE Sensitive     PIP/TAZO <=4 SENSITIVE Sensitive     * >=100,000 COLONIES/mL ESCHERICHIA COLI    Radiology Studies: No results found.   Rayshaun Needle T. [place]  If 7PM-7AM, please contact night-coverage [email or web address] 10/04/2020, 1:38 PM

## 2020-10-05 DIAGNOSIS — E890 Postprocedural hypothyroidism: Secondary | ICD-10-CM | POA: Diagnosis not present

## 2020-10-05 DIAGNOSIS — R531 Weakness: Secondary | ICD-10-CM | POA: Diagnosis not present

## 2020-10-05 DIAGNOSIS — Z7401 Bed confinement status: Secondary | ICD-10-CM | POA: Diagnosis not present

## 2020-10-05 DIAGNOSIS — Z7901 Long term (current) use of anticoagulants: Secondary | ICD-10-CM | POA: Diagnosis not present

## 2020-10-05 DIAGNOSIS — M5136 Other intervertebral disc degeneration, lumbar region: Secondary | ICD-10-CM

## 2020-10-05 DIAGNOSIS — M2550 Pain in unspecified joint: Secondary | ICD-10-CM | POA: Diagnosis not present

## 2020-10-05 DIAGNOSIS — N390 Urinary tract infection, site not specified: Secondary | ICD-10-CM

## 2020-10-05 DIAGNOSIS — K5900 Constipation, unspecified: Secondary | ICD-10-CM | POA: Diagnosis not present

## 2020-10-05 DIAGNOSIS — S32018D Other fracture of first lumbar vertebra, subsequent encounter for fracture with routine healing: Secondary | ICD-10-CM | POA: Diagnosis not present

## 2020-10-05 DIAGNOSIS — I82492 Acute embolism and thrombosis of other specified deep vein of left lower extremity: Secondary | ICD-10-CM | POA: Diagnosis not present

## 2020-10-05 DIAGNOSIS — I10 Essential (primary) hypertension: Secondary | ICD-10-CM | POA: Diagnosis not present

## 2020-10-05 DIAGNOSIS — W19XXXA Unspecified fall, initial encounter: Secondary | ICD-10-CM | POA: Diagnosis not present

## 2020-10-05 DIAGNOSIS — B9620 Unspecified Escherichia coli [E. coli] as the cause of diseases classified elsewhere: Secondary | ICD-10-CM | POA: Diagnosis not present

## 2020-10-05 DIAGNOSIS — G629 Polyneuropathy, unspecified: Secondary | ICD-10-CM | POA: Diagnosis not present

## 2020-10-05 DIAGNOSIS — M545 Low back pain, unspecified: Secondary | ICD-10-CM | POA: Diagnosis not present

## 2020-10-05 DIAGNOSIS — D649 Anemia, unspecified: Secondary | ICD-10-CM | POA: Diagnosis not present

## 2020-10-05 DIAGNOSIS — S22088A Other fracture of T11-T12 vertebra, initial encounter for closed fracture: Secondary | ICD-10-CM

## 2020-10-05 MED ORDER — CEPHALEXIN 500 MG PO CAPS: 500.0000 mg | ORAL_CAPSULE | Freq: Four times a day (QID) | ORAL | 0 refills | Status: AC

## 2020-10-05 MED ORDER — TRAMADOL HCL 50 MG PO TABS: 50.0000 mg | ORAL_TABLET | Freq: Three times a day (TID) | ORAL | 0 refills | Status: AC | PRN

## 2020-10-05 MED ORDER — PREGABALIN 75 MG PO CAPS: 75.0000 mg | ORAL_CAPSULE | Freq: Two times a day (BID) | ORAL | 0 refills | Status: AC

## 2020-10-05 MED ORDER — DOCUSATE SODIUM 100 MG PO CAPS
100.0000 mg | ORAL_CAPSULE | Freq: Two times a day (BID) | ORAL | 0 refills | Status: DC
Start: 2020-10-05 — End: 2021-04-12

## 2020-10-05 MED ORDER — OXYCODONE HCL 5 MG PO TABS: 5.0000 mg | ORAL_TABLET | Freq: Four times a day (QID) | ORAL | 0 refills | Status: AC | PRN

## 2020-10-05 MED ORDER — POLYVINYL ALCOHOL 1.4 % OP SOLN: 1.0000 [drp] | OPHTHALMIC | 0 refills | Status: AC | PRN

## 2020-10-05 MED ORDER — MAGNESIUM CITRATE PO SOLN: 1.0000 | Freq: Every day | ORAL | Status: DC | PRN

## 2020-10-05 MED ORDER — HYDROCHLOROTHIAZIDE 12.5 MG PO CAPS: 12.5000 mg | ORAL_CAPSULE | Freq: Every day | ORAL | 11 refills | Status: DC

## 2020-10-05 MED ORDER — POLYETHYLENE GLYCOL 3350 17 G PO PACK: 17.0000 g | PACK | Freq: Two times a day (BID) | ORAL | 0 refills | Status: DC | PRN

## 2020-10-05 NOTE — TOC Progression Note (Signed)
Transition of Care [facility]) - Progression Note    Patient Details  Name: [name] MRN: [id number] Date of Birth: 15-Mar-1927  Transition of Care [facility]) CM/SW Contact  Jacalyn Lefevre Edson Snowball, RN Phone Number: 10/05/2020, 11:42 AM  Clinical Narrative:     Patient ready for discharge to [facility] today. Olivia Mackie at [facility] discussed with patient's daughter [name].  [place] summary sent to [facility] [place]. Bedside nurse to call report to [phone]. Will arrange PTAR when nurse ready   Expected Discharge Plan: [place]    Expected Discharge Plan and Services Expected Discharge Plan: [place]   Discharge Planning Services: CM Consult Post Acute Care Choice: [place] arrangements for the past 2 months: Single Family Home Expected Discharge Date: 10/05/20               DME Arranged: 3-N-1,Walker rolling,Wheelchair manual DME Agency: [employer] Date DME Agency Contacted: 10/01/20 Time DME Agency Contacted: [phone] Representative spoke with at DME Agency: [place] Arranged: PT,OT,Nurse's Aide           Social Determinants of Health (SDOH) Interventions    Readmission Risk Interventions No flowsheet data found.

## 2020-10-05 NOTE — Progress Notes (Signed)
[name] to be D/C'd per MD order. Discussed with the patient and all questions fully answered.  IV catheter discontinued intact. Site without signs and symptoms of complications. Dressing and pressure applied.  An After Visit Summary was printed and given to the [place].  Patient to be escorted via stretcher, and D/C to [facility].

## 2020-10-05 NOTE — Discharge Summary (Signed)
Physician Discharge Summary  [name] OVF:[id number] DOB: [birth year] DOA: 09/27/2020  PCP: [name], DO  Admit date: 09/27/2020 Discharge date: 10/05/2020  Admitted From: Home Disposition: SNF  Recommendations for Outpatient Follow-up:  1. Follow ups as below. 2. Please obtain CBC/BMP/Mag at follow up 3. Please follow up on the following pending results: None   Discharge Condition: Stable CODE STATUS: DNR/DNI   Contact information for follow-up providers    [facility], [facility]. Schedule an appointment as soon as possible for a visit in 3 week(s).   Specialty: Neurosurgery Contact information: [place] [address] [phone]            Contact information for after-discharge care    Destination    [facility] Preferred SNF .   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  In ED, slightly hypertensive.  Labs without significant finding.  CT head without acute finding.  CT abdomen and pelvis raise concern for mild L1 compression fracture.  Neurosurgery consulted and recommended lumbar MRI.  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  Neurosurgery recommended pain control, TLSO brace, PT/OT and outpatient follow-up in 4 weeks.   Patient had dysuria with new low back pain. Started on ceftriaxone. Urinalysis without significant finding. Urine culture with pansensitive E. Coli.  Transitioned to Ancef and  discharged on p.o. Keflex for 3 more days.  Therapy recommended SNF.  See individual problem list below for more on hospital course.  Discharge Diagnoses:  Accidental fall at home-no syncope or presyncope.  He had a headache but no LOS.  CTH negative.  T12 compression fracture with about 10% height loss Possible T11 compression fracture Possible small epidural hemorrhage Vitamin D deficiency-vitamin D level 11.48. Lower back pain due to the above -Neurosurgery recs:pain control, TLSO brace, PT/OT and outpatient f/up in 4 weeks -Pain control with scheduled Tylenol, as needed tramadol and oxycodone -P.o. vitamin D 50,000 units weekly.  Add calcium supplementation as well -Bowel regimen-scheduled Colace, as needed MiraLAX, Senokot-S and mag citrate -Fall precautions  Neuropathy/possible radiculopathy Severe facet arthropathy at L3-4, L4-5 and L5-S1 Broad disc bulge with severe canal stenosis at L3-4 and L4-5 -No signs of cauda equina or red flags. -Continue Lyrica and other pain meds as above -Outpatient follow-up with neurosurgery in 3 weeks after discharge -Continue PT/OT at SNF  E. coli UTI/possible pyelonephritis: had dysuria with new back pain. CVA tenderness but but difficult to tell if this is related to pyelonephritis or musculoskeletal as she is also tender to palpation. Urine culture with pansensitive E. coli.  -Ceftriaxone 12/7>> Ancef 12/11>> Keflex 12/13 through 12/15.   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  Has been on Eliquis for about a year. -Continue home Eliquis-resumed on 12/7  Hypothyroidism: TSH 5.341. -Continue home Synthroid  Essential hypertension: SBP 150s. -Pain control -Start low-dose HCTZ -Recheck BMP in 1 week  Normocytic anemia/iron deficiency anemia: Hgb down about 2 g since admit.  Likely dilutional from  IV fluid.  Denies GI bleed.  Anemia panel shows some degree of iron deficiency. -P.o. ferrous sulfate with bowel  regimen -Recheck CBC in 1 week  Debility/physical deconditioning: Ambulates with walker at baseline.  Independent for ADLs.  Now requiring moderate assist for bed mobility and transfers.  Daughter concerned about ability to provide the support at home.  Therapy recommended SNF. -Continue PT/OT at SNF  Thrombocytopenia: Platelet 129> 125> 145.  Relatively stable. -Recheck in 1 week  Constipation:  -Bowel regimen as above.  Class II obesity Body mass index is 36.51 kg/m.            Discharge Exam: Vitals:   10/04/20 2000 10/05/20 0458  BP: (!) 151/72 (!) 154/70  Pulse: 72 79  Resp: 18 17  Temp: 98.1 F (36.7 C) 98.3 F (36.8 C)  SpO2: 98% 98%    GENERAL: No apparent distress.  Nontoxic. HEENT: MMM.  Vision and hearing grossly intact.  NECK: Supple.  No apparent JVD.  RESP: On RA.  No IWOB.  Fair aeration bilaterally. CVS:  RRR. Heart sounds normal.  ABD/GI/GU: Bowel sounds present. Soft. Non tender.  MSK/EXT:  Moves extremities. No apparent deformity. No edema.  SKIN: no apparent skin lesion or wound NEURO: Awake, alert and oriented appropriately.  No apparent focal neuro deficit other than BLE weakness PSYCH: Calm. Normal affect.   Discharge Instructions  Discharge Instructions    Call MD for:  severe uncontrolled pain   Complete by: As directed    Call MD for:  temperature >100.4   Complete by: As directed    Diet - low sodium heart healthy   Complete by: As directed    Discharge instructions   Complete by: As directed    It has been a pleasure taking care of you!  You were hospitalized after a fall and vertebral fracture.  Neurosurgery recommended outpatient follow-up in 4 weeks.  You may continue using Tylenol and tramadol for pain.  We have also started you on vitamin D for low vitamin D level.  Please review your new medication list and the directions on your medications before you take them.   Take care,   Increase activity slowly   Complete by:  As directed      Allergies as of 10/05/2020      Reactions   Codeine       Medication List    STOP taking these medications   traMADol 100 MG 24 hr tablet Commonly known as: ULTRAM-ER Replaced by: traMADol 50 MG tablet     TAKE these medications   acetaminophen 500 MG tablet Commonly known as: TYLENOL Take 2 tablets (1,000 mg total) by mouth every 8 (eight) hours. What changed:   how much to take  when to take this  reasons to take this   cephALEXin 500 MG capsule Commonly known as: KEFLEX Take 1 capsule (500 mg total) by mouth 4 (four) times daily for 3 days.   docusate sodium 100 MG capsule Commonly known as: COLACE Take 1 capsule (100 mg total) by mouth 2 (two) times daily.   Eliquis 5 MG Tabs tablet Generic drug: apixaban Take 5 mg by mouth 2 (two) times daily.   furosemide 20 MG tablet Commonly known as: LASIX Take 20 mg by mouth daily.   levothyroxine 75 MCG tablet Commonly known as: SYNTHROID Take 75 mcg by mouth daily before breakfast.   magnesium citrate Soln Take 296 mLs (1 Bottle total) by mouth daily as needed for  severe constipation.   oxyCODONE 5 MG immediate release tablet Commonly known as: Oxy IR/ROXICODONE Take 1 tablet (5 mg total) by mouth every 6 (six) hours as needed for up to 5 days for breakthrough pain or severe pain.   polyethylene glycol 17 g packet Commonly known as: MIRALAX / GLYCOLAX Take 17 g by mouth 2 (two) times daily as needed for mild constipation.   polyvinyl alcohol 1.4 % ophthalmic solution Commonly known as: LIQUIFILM TEARS Place 1 drop into both eyes as needed for dry eyes.   pregabalin 75 MG capsule Commonly known as: LYRICA Take 75 mg by mouth 2 (two) times daily.   senna-docusate 8.6-50 MG tablet Commonly known as: Senokot-S Take 2 tablets by mouth 2 (two) times daily as needed for moderate constipation.   sodium chloride 0.65 % Soln nasal spray Commonly known as: OCEAN Place 1 spray into both  nostrils as needed for congestion.   traMADol 50 MG tablet Commonly known as: ULTRAM Take 1 tablet (50 mg total) by mouth every 8 (eight) hours as needed for up to 10 days for moderate pain. Replaces: traMADol 100 MG 24 hr tablet   Vitamin D (Ergocalciferol) 1.25 MG (50000 UNIT) Caps capsule Commonly known as: DRISDOL Take 1 capsule (50,000 Units total) by mouth every 7 (seven) days. Start taking on: October 06, 2020            Durable Medical Equipment  (From admission, onward)         Start     Ordered   10/01/20 1532  For home use only DME standard manual wheelchair with seat cushion  Once       Comments: Patient suffers from T12 compression fracture with about 10% height loss which impairs their ability to perform daily activities like ambulating  in the home.  A cane  will not resolve issue with performing activities of daily living. A wheelchair will allow patient to safely perform daily activities. Patient can safely propel the wheelchair in the home or has a caregiver who can provide assistance. Length of need lifetime . Accessories: elevating leg rests (ELRs), wheel locks, extensions and anti-tippers.  Seat and back cushion   10/01/20 1531   10/01/20 0756  DME 3-in-1  Once        10/01/20 0755   10/01/20 0756  DME Walker  Once       Question Answer Comment  Walker: With 5 Inch Wheels   Patient needs a walker to treat with the following condition Unsteady gait      10/01/20 0755          Consultations:  Neurosurgery  Procedures/Studies:   CT ABDOMEN PELVIS WO CONTRAST  Result Date: 09/27/2020 CLINICAL DATA:  Fall, sacral pain, low back pain worse with movement. EXAM: CT ABDOMEN AND PELVIS WITHOUT CONTRAST TECHNIQUE: Multidetector CT imaging of the abdomen and pelvis was performed following the standard protocol without IV contrast. COMPARISON:  CT abdomen dated 09/12/2020 FINDINGS: Lower chest: Mild bibasilar scarring/atelectasis. Hepatobiliary: Small  layering stones within the otherwise normal-appearing gallbladder. No focal liver abnormality is seen. No bile duct dilatation. Pancreas: Unremarkable. No pancreatic ductal dilatation or surrounding inflammatory changes. Spleen: Unremarkable Adrenals/Urinary Tract: Bilateral renal cysts better demonstrated on earlier contrast-enhanced CT. No renal stone or hydronephrosis bilaterally. No perinephric fluid. No ureteral or bladder calculi are identified. Bladder is unremarkable, partially decompressed. Stomach/Bowel: No dilated large or small bowel loops. No evidence of bowel wall inflammation or bowel injury. Diverticulosis of the sigmoid and  descending colon but no focal inflammatory change to suggest acute diverticulitis. Stomach is unremarkable, partially decompressed. Vascular/Lymphatic: Aortic atherosclerosis. No enlarged lymph nodes are seen. Reproductive: Presumed hysterectomy.  No adnexal mass or free fluid. Other: No free fluid or hemorrhage is seen within the abdomen or pelvis. No free intraperitoneal air. Musculoskeletal: Degenerative spondylosis of the thoracolumbar spine, moderate in degree, most pronounced degenerative hypertrophy within the posterior elements, with additional dystrophic/hypertrophic changes of the spinous processes of the lumbar spine and sacrum compatible with Baastrup's disease which can be a source of chronic low back pain. Additional advanced degenerative change at both hip joints. Prevertebral fluid stranding is present at the L1 vertebral body level. There is a subtle compression fracture deformity of the vertebral body which is most likely acute, with associated mild buckle fracture deformity along the anterior cortex, and probable nondisplaced fracture extension to the middle column to the RIGHT of midline. No convincing extension to the posterior elements. No associated vertebral body displacement/subluxation. IMPRESSION: 1. Subtle mild compression fracture deformity of the L1  vertebral body which is most likely ACUTE given the associated fluid stranding/inflammation within the paravertebral soft tissues, with associated mild buckle fracture deformity along the anterior cortex and probable nondisplaced fracture extension to the middle column suggesting UNSTABLE vertebral body fracture. No associated vertebral body displacement/subluxation. Would consider lumbar spine MRI to confirm acuity and to more definitively characterize extent. 2. No acute findings within the abdomen or pelvis. No free fluid or hemorrhage. No evidence of bowel wall inflammation or bowel injury. No evidence of acute solid organ abnormality. No renal or ureteral calculi. 3. Colonic diverticulosis without evidence of acute diverticulitis. 4. Cholelithiasis without evidence of acute cholecystitis. 5. Chronic/degenerative changes of the lumbosacral spine, as detailed above. Aortic Atherosclerosis (ICD10-I70.0). Electronically Signed   By: Franki Cabot M.D.   On: 09/27/2020 08:28   DG Thoracic Spine 2 View  Result Date: 09/29/2020 CLINICAL DATA:  T12 vertebral body compression fracture. Low back pain. Recent fall. EXAM: THORACIC SPINE 2 VIEWS COMPARISON:  CT abdomen pelvis 09/27/2020 and lumbar spine MRI 09/27/2020 FINDINGS: Normal alignment at the cervicothoracic junction. Degenerative facet arthropathy in the visualized cervical spine. Mild vertebral height loss at T12 and similar to the recent imaging. No clear evidence for additional thoracic vertebral body compression fractures. IMPRESSION: 1. Mild vertebral body height loss at T12 and compatible with known compression fracture. 2. No other definite thoracic vertebral body compression fractures. 3. Degenerative changes in the cervical spine. Electronically Signed   By: Markus Daft M.D.   On: 09/29/2020 09:58   DG Lumbar Spine 2-3 Views  Result Date: 09/29/2020 CLINICAL DATA:  Fall 3 days ago.  Low back pain EXAM: LUMBAR SPINE - 2-3 VIEW COMPARISON:  CT  09/27/2020.  MRI 09/27/2020. FINDINGS: Diffuse osteopenia. Moderate compression fracture at T12 appears progressed since prior study 2 days ago. Diffuse degenerative facet disease throughout the lumbar spine. No subluxation. IMPRESSION: Progressive moderate T12 compression fracture. Diffuse osteopenia. Diffuse degenerative facet disease. Electronically Signed   By: Rolm Baptise M.D.   On: 09/29/2020 09:58   CT Head Wo Contrast  Result Date: 09/27/2020 CLINICAL DATA:  Fall, head trauma. EXAM: CT HEAD WITHOUT CONTRAST TECHNIQUE: Contiguous axial images were obtained from the base of the skull through the vertex without intravenous contrast. COMPARISON:  None. FINDINGS: Brain: Mild generalized age related parenchymal volume loss with commensurate dilatation of the ventricles and sulci. Mild chronic small vessel ischemic changes within the bilateral periventricular white matter regions. No  mass, hemorrhage, edema or other evidence of acute parenchymal abnormality. No extra-axial hemorrhage. Vascular: Chronic calcified atherosclerotic changes of the large vessels at the skull base. No unexpected hyperdense vessel. Skull: Normal. Negative for fracture or focal lesion. Sinuses/Orbits: No acute finding. Other: None. IMPRESSION: 1. No acute findings. No intracranial mass, hemorrhage or edema. No skull fracture. 2. Mild chronic small vessel ischemic changes in the white matter. Electronically Signed   By: Franki Cabot M.D.   On: 09/27/2020 08:15   MR LUMBAR SPINE WO CONTRAST  Result Date: 09/27/2020 CLINICAL DATA:  Back trauma EXAM: MRI LUMBAR SPINE WITHOUT CONTRAST TECHNIQUE: Multiplanar, multisequence MR imaging of the lumbar spine was performed. No intravenous contrast was administered. COMPARISON:  CT abdomen 09/27/2020 FINDINGS: Segmentation:  Standard. Alignment:  Physiologic. Vertebrae: T12 vertebral body compression fracture with approximately 10% height loss. Intermediate signal material along the ventral  epidural thecal sac extending from T12 to L2 which may reflect a small amount of epidural hemorrhage. Suggestion of possible T11 vertebral body compression fracture, but the vertebral body is only partially visualized on this examination. Well-circumscribed T1 hypointense and mildly T2 hyperintense 2 cm bone lesion in the L1 vertebral body. No discitis or osteomyelitis. Conus medullaris and cauda equina: Conus extends to the L1 level. Conus and cauda equina appear normal. Paraspinal and other soft tissues: No acute paraspinal abnormality. Disc levels: Disc spaces: Disc desiccation at L2-3, L3-4 and L4-5. T12-L1: No significant disc bulge. No evidence of neural foraminal stenosis. No central canal stenosis. L1-L2: No significant disc bulge. No evidence of neural foraminal stenosis. No central canal stenosis. Mild bilateral facet arthropathy. L2-L3: No significant disc bulge. No evidence of neural foraminal stenosis. No central canal stenosis. Mild bilateral facet arthropathy. L3-L4: Broad-based disc bulge. Severe bilateral facet arthropathy with ligamentum flavum infolding. Severe spinal stenosis. Mild right foraminal stenosis. No left foraminal stenosis. L4-L5: Broad-based disc bulge. Severe bilateral facet arthropathy. Severe spinal stenosis. Mild right foraminal stenosis. No left foraminal stenosis. L5-S1: No significant disc bulge. No evidence of neural foraminal stenosis. No central canal stenosis. Severe bilateral facet arthropathy. IMPRESSION: 1. Acute T12 vertebral body compression fracture with approximately 10% height loss and marrow edema throughout the vertebral body. Intermediate signal material along the ventral epidural thecal sac extending from T12 to L2 which may reflect a small amount of epidural hemorrhage. 2. Suggestion of possible T11 vertebral body compression fracture, but the vertebral body is only partially visualized on this examination. 3. An indeterminate 2 cm L1 vertebral body bone lesion  likely reflecting an atypical hemangioma. If there is further clinical concern, a follow-up MRI is recommended in 6 months to document stability. 4. At L3-4 there is a broad-based disc bulge. Severe bilateral facet arthropathy with ligamentum flavum infolding. Severe spinal stenosis. Mild right foraminal stenosis. 5. At L4-5 there is a broad-based disc bulge. Severe bilateral facet arthropathy. Severe spinal stenosis. At L5-S1 there is severe bilateral facet arthropathy. Electronically Signed   By: Kathreen Devoid   On: 09/27/2020 16:25        The results of significant diagnostics from this hospitalization (including imaging, microbiology, ancillary and laboratory) are listed below for reference.     Microbiology: Recent Results (from the past 240 hour(s))  Resp Panel by RT-PCR (Flu A&B, Covid) Nasopharyngeal Swab     Status: None   Collection Time: 09/27/20  1:54 PM   Specimen: Nasopharyngeal Swab; Nasopharyngeal(NP) swabs in vial transport medium  Result Value Ref Range Status   SARS Coronavirus 2 by  RT PCR NEGATIVE NEGATIVE Final    Comment: (NOTE) SARS-CoV-2 target nucleic acids are NOT DETECTED.  The SARS-CoV-2 RNA is generally detectable in upper respiratory specimens during the acute phase of infection. The lowest concentration of SARS-CoV-2 viral copies this assay can detect is 138 copies/mL. A negative result does not preclude SARS-Cov-2 infection and should not be used as the sole basis for treatment or other patient management decisions. A negative result may occur with  improper specimen collection/handling, submission of specimen other than nasopharyngeal swab, presence of viral mutation(s) within the areas targeted by this assay, and inadequate number of viral copies(<138 copies/mL). A negative result must be combined with clinical observations, patient history, and epidemiological information. The expected result is Negative.  Fact Sheet for Patients:   EntrepreneurPulse.com.au  Fact Sheet for Healthcare Providers:  IncredibleEmployment.be  This test is no t yet approved or cleared by the [place] FDA and  has been authorized for detection and/or diagnosis of SARS-CoV-2 by FDA under an Emergency Use Authorization (EUA). This EUA will remain  in effect (meaning this test can be used) for the duration of the COVID-19 declaration under Section 564(b)(1) of the Act, 21 U.S.C.section 360bbb-3(b)(1), unless the authorization is terminated  or revoked sooner.       Influenza A by PCR NEGATIVE NEGATIVE Final   Influenza B by PCR NEGATIVE NEGATIVE Final    Comment: (NOTE) The Xpert Xpress SARS-CoV-2/FLU/RSV plus assay is intended as an aid in the diagnosis of influenza from Nasopharyngeal swab specimens and should not be used as a sole basis for treatment. Nasal washings and aspirates are unacceptable for Xpert Xpress SARS-CoV-2/FLU/RSV testing.  Fact Sheet for Patients: EntrepreneurPulse.com.au  Fact Sheet for Healthcare Providers: IncredibleEmployment.be  This test is not yet approved or cleared by the [place] FDA and has been authorized for detection and/or diagnosis of SARS-CoV-2 by FDA under an Emergency Use Authorization (EUA). This EUA will remain in effect (meaning this test can be used) for the duration of the COVID-19 declaration under Section 564(b)(1) of the Act, 21 U.S.C. section 360bbb-3(b)(1), unless the authorization is terminated or revoked.  Performed at [facility], [place] [address] [place], [place] [address]   Culture, Urine     Status: Abnormal   Collection Time: 09/29/20  3:02 PM   Specimen: Urine, Catheterized  Result Value Ref Range Status   Specimen Description URINE, CATHETERIZED  Final   Special Requests   Final    NONE Performed at [facility], [place] [address] [place], [place] [address]    Culture  >=100,000 COLONIES/mL ESCHERICHIA COLI (A)  Final   Report Status 10/02/2020 FINAL  Final   Organism ID, Bacteria ESCHERICHIA COLI (A)  Final      Susceptibility   Escherichia coli - MIC*    AMPICILLIN 4 SENSITIVE Sensitive     CEFAZOLIN <=4 SENSITIVE Sensitive     CEFEPIME <=0.12 SENSITIVE Sensitive     CEFTRIAXONE <=0.25 SENSITIVE Sensitive     CIPROFLOXACIN <=0.25 SENSITIVE Sensitive     GENTAMICIN <=1 SENSITIVE Sensitive     IMIPENEM <=0.25 SENSITIVE Sensitive     NITROFURANTOIN <=16 SENSITIVE Sensitive     TRIMETH/SULFA <=20 SENSITIVE Sensitive     AMPICILLIN/SULBACTAM <=2 SENSITIVE Sensitive     PIP/TAZO <=4 SENSITIVE Sensitive     * >=100,000 COLONIES/mL ESCHERICHIA COLI  SARS Coronavirus 2 by RT PCR (hospital order, performed in [place] hospital lab) Nasopharyngeal Nasopharyngeal Swab     Status:  None   Collection Time: 10/04/20  5:29 PM   Specimen: Nasopharyngeal Swab  Result Value Ref Range Status   SARS Coronavirus 2 NEGATIVE NEGATIVE Final    Comment: (NOTE) SARS-CoV-2 target nucleic acids are NOT DETECTED.  The SARS-CoV-2 RNA is generally detectable in upper and lower respiratory specimens during the acute phase of infection. The lowest concentration of SARS-CoV-2 viral copies this assay can detect is 250 copies / mL. A negative result does not preclude SARS-CoV-2 infection and should not be used as the sole basis for treatment or other patient management decisions.  A negative result may occur with improper specimen collection / handling, submission of specimen other than nasopharyngeal swab, presence of viral mutation(s) within the areas targeted by this assay, and inadequate number of viral copies (<250 copies / mL). A negative result must be combined with clinical observations, patient history, and epidemiological information.  Fact Sheet for Patients:   StrictlyIdeas.no  Fact Sheet for Healthcare  Providers: BankingDealers.co.za  This test is not yet approved or  cleared by the [place] FDA and has been authorized for detection and/or diagnosis of SARS-CoV-2 by FDA under an Emergency Use Authorization (EUA).  This EUA will remain in effect (meaning this test can be used) for the duration of the COVID-19 declaration under Section 564(b)(1) of the Act, 21 U.S.C. section 360bbb-3(b)(1), unless the authorization is terminated or revoked sooner.  Performed at [facility], [place] [address], [place], [place] [address]      Labs: BNP (last 3 results) No results for input(s): BNP in the last 8760 hours. Basic Metabolic Panel: Recent Labs  Lab 09/30/20 0054 10/03/20 0400  NA 139 140  K 3.9 4.5  CL 104 106  CO2 28 26  GLUCOSE 110* 94  BUN 10 12  CREATININE 0.67 0.78  CALCIUM 8.5* 8.8*  MG 1.9 2.5*  PHOS 2.9 3.7   Liver Function Tests: Recent Labs  Lab 09/30/20 0054 10/03/20 0400  ALBUMIN 2.6* 2.7*   No results for input(s): LIPASE, AMYLASE in the last 168 hours. No results for input(s): AMMONIA in the last 168 hours. CBC: Recent Labs  Lab 09/29/20 0504 09/30/20 0054 10/03/20 0400  WBC 6.0 5.1 4.5  HGB 10.7* 10.9* 10.7*  HCT 32.9* 33.7* 33.3*  MCV 85.9 85.3 85.6  PLT 129* 125* 146*   Cardiac Enzymes: No results for input(s): CKTOTAL, CKMB, CKMBINDEX, TROPONINI in the last 168 hours. BNP: Invalid input(s): POCBNP CBG: No results for input(s): GLUCAP in the last 168 hours. D-Dimer No results for input(s): DDIMER in the last 72 hours. Hgb A1c No results for input(s): HGBA1C in the last 72 hours. Lipid Profile No results for input(s): CHOL, HDL, LDLCALC, TRIG, CHOLHDL, LDLDIRECT in the last 72 hours. Thyroid function studies No results for input(s): TSH, T4TOTAL, T3FREE, THYROIDAB in the last 72 hours.  Invalid input(s): FREET3 Anemia work up No results for input(s): VITAMINB12, FOLATE, FERRITIN, TIBC, IRON, RETICCTPCT in  the last 72 hours. Urinalysis    Component Value Date/Time   COLORURINE YELLOW 09/29/2020 1502   APPEARANCEUR CLEAR 09/29/2020 1502   LABSPEC 1.009 09/29/2020 1502   PHURINE 5.0 09/29/2020 1502   GLUCOSEU NEGATIVE 09/29/2020 1502   HGBUR SMALL (A) 09/29/2020 1502   BILIRUBINUR NEGATIVE 09/29/2020 1502   KETONESUR NEGATIVE 09/29/2020 1502   PROTEINUR NEGATIVE 09/29/2020 1502   NITRITE NEGATIVE 09/29/2020 1502   LEUKOCYTESUR NEGATIVE 09/29/2020 1502   Sepsis Labs Invalid input(s): PROCALCITONIN,  WBC,  LACTICIDVEN   Time  coordinating discharge: 40 minutes  SIGNED:  Mercy Riding, MD  [facility] 10/05/2020, 9:14 AM  If 7PM-7AM, please contact night-coverage [email or web address]

## 2020-10-05 NOTE — Progress Notes (Signed)
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 Lspine MRI: T12 compression fx, possible T11 fx and disc bulge at L3-4, L4-5. No surgery recommended, pain control with TLSO.    PT Comments    Pt supine on arrival, daughter present and supportive, pt agreeable to therapy session and put forth good effort toward all activities, but remains limited due to moderate/severe reported lower back pain, RN notified. Pt performed bed mobility/transfers with +77modA, unable to progress gait beyond pre-gait activity/lateral stepping at bedside due to pain after sit<>stand x2 trials. Deferred OOB as pt requesting bath and very fatigued after second transfer. Plan to progress gait tolerance/distance next session if pain allows, pt will likely need chair follow for safety. Pt will continue to benefit from skilled rehab in a low intensity post-acute setting to maximize functional gains before returning home.   Follow Up Recommendations  SNF;Supervision/Assistance - 24 hour;Supervision for mobility/OOB     Equipment Recommendations  Rolling walker with 5" wheels;Wheelchair (measurements PT);Wheelchair cushion (measurements PT)    Recommendations for Other Services       Precautions / Restrictions Precautions Precautions: Fall;Back Precaution Booklet Issued: Yes (comment) Precaution Comments: handout given prev session for back precs and log roll Required Braces or Orthoses: Spinal Brace Spinal Brace: Thoracolumbosacral orthotic;Applied in sitting position Restrictions Weight Bearing Restrictions: No    Mobility  Bed Mobility Overal bed mobility: Needs Assistance Bed Mobility: Rolling Rolling: Mod assist Sidelying to sit: Mod assist;HOB  elevated       General bed mobility comments: to R EOB, pt need max multimodal cues for technique and increased time to perform, modA trunk assist  Transfers Overall transfer level: Needs assistance Equipment used: Rolling walker (2 wheeled) Transfers: Sit to/from Stand Sit to Stand: Mod assist         General transfer comment: from elevated bed height x2 reps to RW, pt needs cues for hand placement each attempt  Ambulation/Gait             General Gait Details: ~2-3 lateral steps toward [facility], pt defers further 2/2 LBP   Stairs             Wheelchair Mobility    Modified Rankin (Stroke Patients Only)       Balance Overall balance assessment: Needs assistance Sitting-balance support: Bilateral upper extremity supported;Feet supported Sitting balance-Leahy Scale: Poor Sitting balance - Comments: requires BUEs for sitting balance Postural control: Posterior lean (some posterior lean but with pt holding side rail no LOB) Standing balance support: Bilateral upper extremity supported Standing balance-Leahy Scale: Poor Standing balance comment: Assist with RW and external support for stability, posterior lean                            Cognition Arousal/Alertness: Awake/alert Behavior During Therapy: WFL for tasks assessed/performed Overall Cognitive Status: Impaired/Different from baseline Area of Impairment: Memory;Problem solving;Awareness                     Memory: Decreased recall of precautions       Problem Solving: Slow processing;Difficulty sequencing;Requires verbal cues;Requires tactile cues General Comments: pt following commands with increased time; 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; eyes both closed during most of  session, needs freq cues for eyes open      Exercises General Exercises - Lower Extremity Ankle Circles/Pumps: AROM;Strengthening;Both;15 reps;Supine Quad Sets: AROM;Strengthening;Both;10 reps;Supine Gluteal Sets:  AROM;Strengthening;Both;10 reps;Supine Heel Slides: AROM;Strengthening;Supine;15 reps Hip ABduction/ADduction: AROM;Strengthening;Both;15 reps;Supine    General Comments General comments (skin integrity, edema, etc.): pt daughter in room; bed wet on arrival, pad changed and NT notified pt requesting bath, RN notified pt wants pain meds      Pertinent Vitals/Pain Pain Assessment: 0-10 Pain Score: 8  Pain Location: low back Pain Descriptors / Indicators: Grimacing;Moaning;Sharp;Discomfort Pain Intervention(s): Monitored during session;Repositioned;Patient requesting pain meds-RN notified  HR 90's bpm during mobility and Spo2 95% on RA seated EOB Vitals:   10/05/20 0458  BP: (!) 154/70  Pulse: 79  Resp: 17  Temp: 98.3 F (36.8 C)  SpO2: 98%    Home Living                      Prior Function            PT Goals (current goals can now be found in the care plan section) Acute Rehab PT Goals Patient Stated Goal: daughter now reports that they want pt to go to SNF for further rehab. PT Goal Formulation: With patient/family Time For Goal Achievement: 10/14/20 Potential to Achieve Goals: Good Progress towards PT goals: Progressing toward goals (slow progress, pain)    Frequency    Min 3X/week      PT Plan Current plan remains appropriate    Co-evaluation              AM-PAC PT "6 Clicks" Mobility   Outcome Measure  Help needed turning from your back to your side while in a flat bed without using bedrails?: A Lot Help needed moving from lying on your back to sitting on the side of a flat bed without using bedrails?: A Lot Help needed moving to and from a bed to a chair (including a wheelchair)?: A Lot Help needed standing up from a chair using your arms (e.g., wheelchair or bedside chair)?: A Lot Help needed to walk in hospital room?: A Lot Help needed climbing 3-5 steps with a railing? : Total 6 Click Score: 11    End of Session Equipment Utilized  During Treatment: Back brace;Gait belt Activity Tolerance: Patient limited by pain;Patient tolerated treatment well Patient left: in bed;with call bell/phone within reach (unable to set bed alarm -screen malfunctioning on remote) Nurse Communication: Mobility status;Precautions PT Visit Diagnosis: Unsteadiness on feet (R26.81);Other abnormalities of gait and mobility (R26.89);Pain Pain - part of body:  (back)     Time: 5784-6962 PT Time Calculation (min) (ACUTE ONLY): 36 min  Charges:  $Therapeutic Exercise: 8-22 mins $Therapeutic Activity: 8-22 mins                     Micheal Sheen P., PTA Acute Rehabilitation Services Pager: [phone] Office: [place] 10/05/2020, 10:34 AM

## 2020-10-20 DIAGNOSIS — Z09 Encounter for follow-up examination after completed treatment for conditions other than malignant neoplasm: Secondary | ICD-10-CM | POA: Diagnosis not present

## 2020-10-20 DIAGNOSIS — S22080A Wedge compression fracture of T11-T12 vertebra, initial encounter for closed fracture: Secondary | ICD-10-CM | POA: Diagnosis not present

## 2020-10-20 DIAGNOSIS — Z86711 Personal history of pulmonary embolism: Secondary | ICD-10-CM | POA: Diagnosis not present

## 2020-10-20 DIAGNOSIS — I6930 Unspecified sequelae of cerebral infarction: Secondary | ICD-10-CM | POA: Diagnosis not present

## 2020-10-29 DIAGNOSIS — Z03818 Encounter for observation for suspected exposure to other biological agents ruled out: Secondary | ICD-10-CM | POA: Diagnosis not present

## 2020-11-03 DIAGNOSIS — H903 Sensorineural hearing loss, bilateral: Secondary | ICD-10-CM | POA: Diagnosis not present

## 2020-11-03 DIAGNOSIS — H6123 Impacted cerumen, bilateral: Secondary | ICD-10-CM | POA: Diagnosis not present

## 2020-11-06 DIAGNOSIS — G629 Polyneuropathy, unspecified: Secondary | ICD-10-CM | POA: Diagnosis not present

## 2020-11-06 DIAGNOSIS — R531 Weakness: Secondary | ICD-10-CM | POA: Diagnosis not present

## 2020-11-10 DIAGNOSIS — M545 Low back pain, unspecified: Secondary | ICD-10-CM | POA: Diagnosis not present

## 2020-11-10 DIAGNOSIS — Z6831 Body mass index (BMI) 31.0-31.9, adult: Secondary | ICD-10-CM | POA: Diagnosis not present

## 2020-11-10 DIAGNOSIS — R030 Elevated blood-pressure reading, without diagnosis of hypertension: Secondary | ICD-10-CM | POA: Diagnosis not present

## 2020-11-10 DIAGNOSIS — S22080D Wedge compression fracture of T11-T12 vertebra, subsequent encounter for fracture with routine healing: Secondary | ICD-10-CM | POA: Diagnosis not present

## 2020-12-07 DIAGNOSIS — R531 Weakness: Secondary | ICD-10-CM | POA: Diagnosis not present

## 2020-12-07 DIAGNOSIS — G629 Polyneuropathy, unspecified: Secondary | ICD-10-CM | POA: Diagnosis not present

## 2020-12-18 DIAGNOSIS — S22080D Wedge compression fracture of T11-T12 vertebra, subsequent encounter for fracture with routine healing: Secondary | ICD-10-CM | POA: Diagnosis not present

## 2020-12-24 ENCOUNTER — Emergency Department (HOSPITAL_COMMUNITY): Payer: Medicare Other

## 2020-12-24 ENCOUNTER — Encounter (HOSPITAL_COMMUNITY): Payer: Self-pay

## 2020-12-24 ENCOUNTER — Other Ambulatory Visit: Payer: Self-pay

## 2020-12-24 ENCOUNTER — Emergency Department (HOSPITAL_COMMUNITY)
Admission: EM | Admit: 2020-12-24 | Discharge: 2020-12-25 | Disposition: A | Discharge status: Home / Self Care | Payer: Medicare Other | Attending: Emergency Medicine | Admitting: Emergency Medicine

## 2020-12-24 DIAGNOSIS — R42 Dizziness and giddiness: Secondary | ICD-10-CM | POA: Diagnosis not present

## 2020-12-24 DIAGNOSIS — R1084 Generalized abdominal pain: Secondary | ICD-10-CM | POA: Diagnosis not present

## 2020-12-24 DIAGNOSIS — Z96651 Presence of right artificial knee joint: Secondary | ICD-10-CM | POA: Diagnosis not present

## 2020-12-24 DIAGNOSIS — Z79899 Other long term (current) drug therapy: Secondary | ICD-10-CM | POA: Diagnosis not present

## 2020-12-24 DIAGNOSIS — R1032 Left lower quadrant pain: Secondary | ICD-10-CM | POA: Insufficient documentation

## 2020-12-24 DIAGNOSIS — M545 Low back pain, unspecified: Secondary | ICD-10-CM | POA: Diagnosis not present

## 2020-12-24 DIAGNOSIS — Z20822 Contact with and (suspected) exposure to covid-19: Secondary | ICD-10-CM | POA: Diagnosis not present

## 2020-12-24 DIAGNOSIS — R110 Nausea: Secondary | ICD-10-CM | POA: Diagnosis not present

## 2020-12-24 DIAGNOSIS — R109 Unspecified abdominal pain: Secondary | ICD-10-CM | POA: Diagnosis not present

## 2020-12-24 DIAGNOSIS — R0902 Hypoxemia: Secondary | ICD-10-CM | POA: Diagnosis not present

## 2020-12-24 DIAGNOSIS — K760 Fatty (change of) liver, not elsewhere classified: Secondary | ICD-10-CM | POA: Diagnosis not present

## 2020-12-24 DIAGNOSIS — R531 Weakness: Secondary | ICD-10-CM | POA: Insufficient documentation

## 2020-12-24 DIAGNOSIS — I10 Essential (primary) hypertension: Secondary | ICD-10-CM | POA: Insufficient documentation

## 2020-12-24 DIAGNOSIS — E039 Hypothyroidism, unspecified: Secondary | ICD-10-CM | POA: Diagnosis not present

## 2020-12-24 DIAGNOSIS — G8929 Other chronic pain: Secondary | ICD-10-CM | POA: Insufficient documentation

## 2020-12-24 DIAGNOSIS — R29810 Facial weakness: Secondary | ICD-10-CM | POA: Diagnosis not present

## 2020-12-24 DIAGNOSIS — I491 Atrial premature depolarization: Secondary | ICD-10-CM | POA: Diagnosis not present

## 2020-12-24 DIAGNOSIS — Z7901 Long term (current) use of anticoagulants: Secondary | ICD-10-CM | POA: Diagnosis not present

## 2020-12-24 LAB — BASIC METABOLIC PANEL
Anion gap: 9 (ref 5–15)
BUN: 9 mg/dL (ref 8–23)
CO2: 24 mmol/L (ref 22–32)
Calcium: 9.3 mg/dL (ref 8.9–10.3)
Chloride: 108 mmol/L (ref 98–111)
Creatinine, Ser: 0.72 mg/dL (ref 0.44–1.00)
GFR, Estimated: >60 mL/min (ref >60)
Glucose, Bld: 89 mg/dL (ref 70–99)
Potassium: 3.8 mmol/L (ref 3.5–5.1)
Sodium: 141 mmol/L (ref 135–145)

## 2020-12-24 LAB — HEPATIC FUNCTION PANEL
ALT: 11 U/L (ref 0–44)
AST: 18 U/L (ref 15–41)
Albumin: 3.3 g/dL — ABNORMAL LOW (ref 3.5–5.0)
Alkaline Phosphatase: 77 U/L (ref 38–126)
Bilirubin, Direct: <0.1 mg/dL (ref 0.0–0.2)
Total Bilirubin: 0.6 mg/dL (ref 0.3–1.2)
Total Protein: 6.9 g/dL (ref 6.5–8.1)

## 2020-12-24 LAB — RESP PANEL BY RT-PCR (FLU A&B, COVID) ARPGX2
Influenza A by PCR: NEGATIVE
Influenza B by PCR: NEGATIVE
SARS Coronavirus 2 by RT PCR: NEGATIVE

## 2020-12-24 LAB — URINALYSIS, ROUTINE W REFLEX MICROSCOPIC
Bacteria, UA: NONE SEEN
Bilirubin Urine: NEGATIVE
Glucose, UA: NEGATIVE mg/dL
Ketones, ur: NEGATIVE mg/dL
Leukocytes,Ua: NEGATIVE
Nitrite: NEGATIVE
Protein, ur: NEGATIVE mg/dL
Specific Gravity, Urine: 1.021 (ref 1.005–1.030)
pH: 9 — ABNORMAL HIGH (ref 5.0–8.0)

## 2020-12-24 LAB — LIPASE, BLOOD: Lipase: 32 U/L (ref 11–51)

## 2020-12-24 LAB — CBC
HCT: 39 % (ref 36.0–46.0)
Hemoglobin: 12.7 g/dL (ref 12.0–15.0)
MCH: 27.5 pg (ref 26.0–34.0)
MCHC: 32.6 g/dL (ref 30.0–36.0)
MCV: 84.6 fL (ref 80.0–100.0)
Platelets: 174 10*3/uL (ref 150–400)
RBC: 4.61 MIL/uL (ref 3.87–5.11)
RDW: 14.6 % (ref 11.5–15.5)
WBC: 4.5 10*3/uL (ref 4.0–10.5)
nRBC: 0 % (ref 0.0–0.2)

## 2020-12-24 LAB — CBG MONITORING, ED: Glucose-Capillary: 86 mg/dL (ref 70–99)

## 2020-12-24 MED ORDER — IOHEXOL 300 MG/ML  SOLN
100.0000 mL | Freq: Once | INTRAMUSCULAR | Status: AC | PRN
  Administered 2020-12-24: 100 mL via INTRAVENOUS

## 2020-12-24 MED ORDER — LACTATED RINGERS IV BOLUS
500.0000 mL | Freq: Once | INTRAVENOUS | Status: AC
  Administered 2020-12-24: 500 mL via INTRAVENOUS

## 2020-12-24 NOTE — ED Notes (Signed)
Unable to stand pt up due to pain.

## 2020-12-24 NOTE — ED Notes (Signed)
Patient called nurse to room. Patient voided and PureWick did not work properly. Patient cleansed, PureWick replaced and brief applied. Daughter remains at bedside.

## 2020-12-24 NOTE — ED Notes (Signed)
Ortho tech aware of need for brace, biomed has been made aware of need. Waiting for same at this time.

## 2020-12-24 NOTE — ED Provider Notes (Signed)
[place] EMERGENCY DEPARTMENT Provider Note   CSN: [id number] Arrival date & time: 12/24/20  1433     History Chief Complaint  Patient presents with   Dizziness   Weakness   Abdominal Pain    [name] is a 87 y.o. 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 Patient reports intermittent dizziness over the last several days as increasing unsteadiness with ambulation. Requires cane and walker to get around at home. Dizziness worse with standing and moving head. Took meclizine this AM with some improvement. Also reports having some dizziness when remaining still. Additionally, patient also reports LLQ burning abdominal pain that radiates to her L flank that began this AM. Pain worse with eating. Last BM yesterday without hematochezia or melena. Associated nausea without vomiting. Denies urinary symptoms, chest pain, SOB, headache, numbness, vision changes, or weakness.  The history is provided by the patient, a relative and medical records.  Abdominal Pain Pain location:  LLQ Pain quality: burning   Pain radiates to:  L flank Pain severity:  Moderate Onset quality:  Sudden Duration:  1 day Timing:  Constant Progression:  Unchanged Chronicity:  New Context comment:  Spontaneous\ Relieved by:  Nothing Worsened by:  Eating Ineffective treatments:  None tried Associated symptoms: nausea   Associated symptoms: no anorexia, no chest pain, no chills, no constipation, no cough, no diarrhea, no dysuria, no fever, no hematochezia, no hematuria, no melena, no shortness of breath, no sore throat and no vomiting   Risk factors: being elderly and obesity        Past Medical History:  Diagnosis Date   Central retinal artery occlusion of right eye 2015   lost complete vision   Edema     Hypertension    not on BP medication currently   Neuropathy    Post-surgical hypothyroidism    Pulmonary emboli (HCC)    VTE (venous thromboembolism) 12/2018   LLE DVT and PE    Patient Active Problem List   Diagnosis Date Noted   Fall at home, initial encounter 09/27/2020   Post-surgical hypothyroidism    Neuropathy    Hypertension    VTE (venous thromboembolism) 12/2018    Past Surgical History:  Procedure Laterality Date   REPLACEMENT TOTAL KNEE Right 2008   TOTAL THYROIDECTOMY       OB History   No obstetric history on file.     No family history on file.  Social History   Tobacco Use   Smoking status: Never Smoker   Smokeless tobacco: Never Used  Substance Use Topics   Alcohol use: Never   Drug use: Never    Home Medications Prior to Admission medications   Medication Sig Start Date End Date Taking? Authorizing Provider  acetaminophen (TYLENOL) 500 MG tablet Take 2 tablets (1,000 mg total) by mouth every 8 (eight) hours. 10/01/20   Mercy Riding, MD  apixaban (ELIQUIS) 5 MG TABS tablet Take 5 mg by mouth 2 (two) times daily.    [provider]  apixaban (ELIQUIS) 5 MG TABS tablet Take 5 mg by mouth 2 (two) times daily.    [provider]  docusate sodium (COLACE) 100 MG capsule Take 1 capsule (100 mg total) by mouth 2 (two) times daily. 10/05/20   Mercy Riding, MD  furosemide (LASIX) 20 MG tablet Take 20  mg by mouth as needed for fluid.    [provider]  hydrochlorothiazide (MICROZIDE) 12.5 MG capsule Take 1 capsule (12.5 mg total) by mouth daily. 10/05/20 10/05/21  Mercy Riding, MD  levothyroxine (SYNTHROID) 75 MCG tablet Take 75 mcg by mouth daily before breakfast.    [provider]  levothyroxine (SYNTHROID) 75 MCG tablet Take 75 mcg by mouth daily before breakfast.    [provider]  magnesium citrate SOLN Take 296 mLs (1 Bottle total) by mouth daily as needed for severe constipation.  10/05/20   Mercy Riding, MD  polyethylene glycol (MIRALAX / GLYCOLAX) 17 g packet Take 17 g by mouth 2 (two) times daily as needed for mild constipation. 10/05/20   Mercy Riding, MD  polyvinyl alcohol (LIQUIFILM TEARS) 1.4 % ophthalmic solution Place 1 drop into both eyes as needed for dry eyes. 10/05/20   Mercy Riding, MD  pregabalin (LYRICA) 75 MG capsule Take 1 capsule (75 mg total) by mouth 2 (two) times daily. 10/05/20   Mercy Riding, MD  senna-docusate (SENOKOT-S) 8.6-50 MG tablet Take 2 tablets by mouth 2 (two) times daily as needed for moderate constipation. 10/01/20   Mercy Riding, MD  sodium chloride (OCEAN) 0.65 % SOLN nasal spray Place 1 spray into both nostrils as needed for congestion.    [provider]  Vitamin D, Ergocalciferol, (DRISDOL) 1.25 MG (50000 UNIT) CAPS capsule Take 1 capsule (50,000 Units total) by mouth every 7 (seven) days. 10/06/20   Mercy Riding, MD    Allergies    Codeine and Codeine  Review of Systems   Review of Systems  Constitutional: Negative for chills and fever.  HENT: Negative for ear pain and sore throat.   Eyes: Negative for pain and visual disturbance.  Respiratory: Negative for cough and shortness of breath.   Cardiovascular: Negative for chest pain and palpitations.  Gastrointestinal: Positive for abdominal pain and nausea. Negative for anorexia, constipation, diarrhea, hematochezia, melena and vomiting.  Genitourinary: Negative for dysuria and hematuria.  Musculoskeletal: Positive for gait problem. Negative for arthralgias and back pain.  Skin: Negative for color change and rash.  Neurological: Positive for dizziness. Negative for seizures, syncope, facial asymmetry, weakness, light-headedness and headaches.  All other systems reviewed and are negative.   Physical Exam Updated Vital Signs BP (!) 157/71    Pulse 70    Temp 98.4 F (36.9 C) (Oral)    Resp 16    Ht 5\' 3"  (1.6 m)    Wt 93.5 kg    SpO2 97%    BMI 36.51 kg/m    Physical Exam Vitals and nursing note reviewed.  Constitutional:      General: She is awake. She is not in acute distress.    Appearance: She is well-developed and well-groomed. She is obese. She is not ill-appearing.  HENT:     Head: Normocephalic and atraumatic.     Right Ear: External ear normal.     Left Ear: External ear normal.     Nose: Nose normal.     Mouth/Throat:     Mouth: Mucous membranes are moist.     Pharynx: Oropharynx is clear. No oropharyngeal exudate or posterior oropharyngeal erythema.  Eyes:     General: No visual field deficit or scleral icterus.       Right eye: No discharge.        Left eye: No discharge.     Extraocular Movements: Extraocular movements intact.  Pupils: Pupils are equal, round, and reactive to light.     Comments: Ptosis noted to R eye which is chronic for her  Cardiovascular:     Rate and Rhythm: Normal rate.     Pulses: Normal pulses.     Heart sounds: Normal heart sounds.  Pulmonary:     Effort: Pulmonary effort is normal. No respiratory distress.     Breath sounds: Normal breath sounds. No wheezing, rhonchi or rales.  Abdominal:     General: Abdomen is flat. There is no distension.     Palpations: Abdomen is soft.     Tenderness: There is abdominal tenderness in the left lower quadrant. There is no guarding or rebound. Negative signs include Murphy's sign, Rovsing's sign and McBurney's sign.  Musculoskeletal:     Right lower leg: No edema.     Left lower leg: No edema.  Skin:    General: Skin is warm and dry.     Findings: No rash.  Neurological:     General: No focal deficit present.     Mental Status: She is alert and oriented to person, place, and time.     GCS: GCS eye subscore is 4. GCS verbal subscore is 5. GCS motor subscore is 6.     Cranial Nerves: No cranial nerve deficit or dysarthria.     Sensory: Sensation is intact. No sensory deficit.     Motor: Motor function is intact. No pronator drift.      Coordination: Coordination is intact. Finger-Nose-Finger Test normal.     Comments: Ptosis to R eye (chronic). 4/5 strength to RUE and RLE (chronic for her). 5/5 strength to LUE and LLE. Sensation intact.  Psychiatric:        Behavior: Behavior is cooperative.     ED Results / Procedures / Treatments   Labs (all labs ordered are listed, but only abnormal results are displayed) Labs Reviewed  HEPATIC FUNCTION PANEL - Abnormal; Notable for the following components:      Result Value   Albumin 3.3 (*)    All other components within normal limits  URINALYSIS, ROUTINE W REFLEX MICROSCOPIC - Abnormal; Notable for the following components:   Color, Urine STRAW (*)    pH 9.0 (*)    Hgb urine dipstick SMALL (*)    All other components within normal limits  RESP PANEL BY RT-PCR (FLU A&B, COVID) ARPGX2  BASIC METABOLIC PANEL  CBC  LIPASE, BLOOD  CBG MONITORING, ED    EKG EKG Interpretation  Date/Time:  Thursday December 24 2020 14:44:45 EST Ventricular Rate:  78 PR Interval:    QRS Duration: 89 QT Interval:  385 QTC Calculation: 439 R Axis:   75 Text Interpretation: Sinus rhythm Prolonged PR interval Minimal ST elevation, anterior leads Confirmed by Madalyn Rob [phone]) on 12/24/2020 4:40:31 PM   Radiology MR Brain Wo Contrast (neuro protocol)  Result Date: 12/24/2020 CLINICAL DATA:  Vertigo, central dizziness.  Difficulty walking. EXAM: MRI HEAD WITHOUT CONTRAST TECHNIQUE: Multiplanar, multiecho pulse sequences of the brain and surrounding structures were obtained without intravenous contrast. COMPARISON:  CT head September 27, 2020. FINDINGS: Brain: No acute infarction, hemorrhage, hydrocephalus, extra-axial collection or mass lesion. Very mild for age scattered T2/FLAIR hyperintensities in the white matter, most likely related to chronic microvascular ischemic disease. Small remote right cerebellar lacunar infarct. Vascular: Major arterial flow voids are maintained at the skull base.  Skull and upper cervical spine: Normal marrow signal. Sinuses/Orbits: Clear sinuses.  Unremarkable orbits. Other: No  sizable mastoid effusion. Partially imaged posterior disc osteophyte complex at C3-C4 which contacts and indents the ventral cord with suspected at least moderate canal stenosis. IMPRESSION: 1. No evidence of acute intracranial abnormality.  No acute infarct. 2. Partially imaged posterior disc osteophyte complex at C3-C4 which contacts and indents the ventral cord with suspected at least moderate canal stenosis. An MRI of the cervical spine could further characterize if clinically indicated. 3. Small remote right cerebellar lacunar infarct. Electronically Signed   By: Margaretha Sheffield MD   On: 12/24/2020 18:28   CT ABDOMEN PELVIS W CONTRAST  Result Date: 12/24/2020 CLINICAL DATA:  Left lower quadrant pain radiating to left flank EXAM: CT ABDOMEN AND PELVIS WITH CONTRAST TECHNIQUE: Multidetector CT imaging of the abdomen and pelvis was performed using the standard protocol following bolus administration of intravenous contrast. CONTRAST:  11mL OMNIPAQUE IOHEXOL 300 MG/ML  SOLN COMPARISON:  None. FINDINGS: Lower chest: Mitral valve and coronary artery calcifications are seen. There is scattered calcifications within the mediastinum. No hiatal hernia. The visualized portions of the lungs are clear. Hepatobiliary: There is diffuse low density seen throughout the liver parenchyma. Main portal vein is patent. Small amount of layering hyperdense stones/sludge are seen. Pancreas: Unremarkable. No pancreatic ductal dilatation or surrounding inflammatory changes. Spleen: Normal in size without focal abnormality. Adrenals/Urinary Tract: Both adrenal glands appear normal. Mild bilateral pelviectasis and proximal ureterectasis is noted. No renal collecting system calculi however are seen. Bilateral low-density lesions are seen within both kidneys the largest which is in the upper pole of the left kidney  measuring 3 cm, likely simple renal cyst. The bladder is fluid-filled and distended. Stomach/Bowel: The stomach, small bowel, and colon are normal in appearance. No inflammatory changes, wall thickening, or obstructive findings. Scattered colonic diverticula are noted.The appendix is normal. Vascular/Lymphatic: There are no enlarged mesenteric, retroperitoneal, or pelvic lymph nodes. Scattered aortic atherosclerotic calcifications are seen without aneurysmal dilatation. Reproductive: The patient is status post hysterectomy. No adnexal masses or collections seen. Other: No evidence of abdominal wall mass or hernia. Musculoskeletal: Age indeterminate, however new since September 12, 2020 compression fracture of the T12 vertebral body with approximately 75% loss in vertebral body height. There is buckling of the posterior cortex causing mild canal narrowing. There is also prevertebral soft tissue swelling. IMPRESSION: 1. Mild bilateral pelviectasis with a fluid-filled distended bladder which may be due to bladder dysfunction or outlet obstruction. No renal or collecting system calculi however are noted. 2. Diverticulosis without diverticulitis 3. Cholelithiasis/sludge 4. Hepatic steatosis 5. Age indeterminate, new since September 12, 2020 but seen on prior radiographic December 18, 2020, compression fracture of the T12 vertebral body with 85% loss in height and buckling of the posterior cortex causing mild canal narrowing. 6.  Aortic Atherosclerosis (ICD10-I70.0). Electronically Signed   By: Prudencio Pair M.D.   On: 12/24/2020 18:53   DG Chest Portable 1 View  Result Date: 12/24/2020 CLINICAL DATA:  Abdominal pain. EXAM: PORTABLE CHEST 1 VIEW COMPARISON:  09/12/2020 FINDINGS: Normal sized heart. Tortuous and partially calcified thoracic aorta. Stable elevated left hemidiaphragm. Minimal left basilar atelectasis/scarring. Minimal right basilar linear atelectasis or scarring. Otherwise, clear lungs. Diffuse osteopenia.  Thoracic spine degenerative changes. IMPRESSION: No acute abnormality. Electronically Signed   By: Claudie Revering M.D.   On: 12/24/2020 16:25    Procedures Procedures  Medications Ordered in ED Medications  lactated ringers bolus 500 mL (0 mLs Intravenous Stopped 12/24/20 1729)  iohexol (OMNIPAQUE) 300 MG/ML solution 100 mL (100 mLs Intravenous Contrast  Given 12/24/20 1826)    ED Course  I have reviewed the triage vital signs and the nursing notes.  Pertinent labs & imaging results that were available during my care of the patient were reviewed by me and considered in my medical decision making (see chart for details).    MDM Rules/Calculators/A&P                          Patient is a 75yoF w/ history and physical as described above who presents to the ED for dizziness and LLQ abdominal pain. VS reassuring and HDS. Patient resting comfortably and in no acute distress. Initial workup includes blood work, MRI brain, and CT abd/pelvis.  MRI brain unremarkable. CT with T12 vertebral body compression fracture with 85% height loss. Labs overall reassuring. Discussed T12 fracture with Neurosurgery given worsening height loss since last imaged several months ago who recommended TLSO brace and outpatient follow in clinic in several weeks. On reassessment, patient tolerating PO intake without abdominal pain and repeat abdominal exam benign. Patient wanting to be discharged home. Doubt cholecystitis, pancreatitis, bowel perforation, SBO, diverticulitis, UTI, pyelonephritis, nephrolithiasis, mesenteric ischemia, or AAA. Doubt acute emergent intraabdominal pathology at this time. Recommend close follow up with PCP on outpatient basis for further evaluation. MRI brain reassuring for CVA. Suspect dizziness likely peripheral and recommend she follow up with PCP on outpatient basis as well. Patient states she has prescription for meclizine at home that she will take if her symptoms return. Patient otherwise HDS and  appropriate for discharge.  Strict return precautions provided and discussed. Questions and concerns addressed. Patient and daughter verbalized understanding and amenable with discharge plan. Discharged in stable condition.  Final Clinical Impression(s) / ED Diagnoses Final diagnoses:  Dizziness  Chronic midline low back pain without sciatica  Left lower quadrant abdominal pain    Rx / DC Orders ED Discharge Orders    None       Christy Gentles, MD 12/25/20 0040    Lucrezia Starch, MD 12/26/20 [phone]

## 2020-12-24 NOTE — ED Notes (Signed)
Pt returned from MRI, nad noted

## 2020-12-24 NOTE — Progress Notes (Signed)
Orthopedic Tech Progress Note Patient Details:  [name] 1927-05-25 [id number] TLSO Brace has been ordered  Patient ID: [name], female   DOB: 1927-04-07, 87 y.o.   MRN: [id number]   [name] 12/24/2020, 10:59 PM

## 2020-12-24 NOTE — Discharge Instructions (Signed)
Please wear your TLSO brace at all times until you follow up with [facility].

## 2020-12-24 NOTE — ED Notes (Signed)
Patient transported to MRI 

## 2020-12-24 NOTE — ED Triage Notes (Signed)
Pt from home with ems c.o dizziness, generalized weakness since yesterday and lower abd pain for the past 2 days. Denies nausea or diarrhea, last BM yesterday. Pt a.o, VSS. States her dizziness is worse with ambulation, negative orthostatics with ems.

## 2020-12-25 NOTE — ED Notes (Signed)
Brace applied by ortho tech

## 2020-12-29 DIAGNOSIS — Z86711 Personal history of pulmonary embolism: Secondary | ICD-10-CM | POA: Diagnosis not present

## 2020-12-29 DIAGNOSIS — S22080A Wedge compression fracture of T11-T12 vertebra, initial encounter for closed fracture: Secondary | ICD-10-CM | POA: Diagnosis not present

## 2020-12-29 DIAGNOSIS — R269 Unspecified abnormalities of gait and mobility: Secondary | ICD-10-CM | POA: Diagnosis not present

## 2020-12-29 DIAGNOSIS — Z7901 Long term (current) use of anticoagulants: Secondary | ICD-10-CM | POA: Diagnosis not present

## 2021-01-04 DIAGNOSIS — G629 Polyneuropathy, unspecified: Secondary | ICD-10-CM | POA: Diagnosis not present

## 2021-01-04 DIAGNOSIS — R531 Weakness: Secondary | ICD-10-CM | POA: Diagnosis not present

## 2021-01-07 DIAGNOSIS — Z86711 Personal history of pulmonary embolism: Secondary | ICD-10-CM | POA: Diagnosis not present

## 2021-01-07 DIAGNOSIS — Z7901 Long term (current) use of anticoagulants: Secondary | ICD-10-CM | POA: Diagnosis not present

## 2021-01-07 DIAGNOSIS — R001 Bradycardia, unspecified: Secondary | ICD-10-CM | POA: Diagnosis not present

## 2021-01-07 DIAGNOSIS — K219 Gastro-esophageal reflux disease without esophagitis: Secondary | ICD-10-CM | POA: Diagnosis not present

## 2021-01-09 ENCOUNTER — Inpatient Hospital Stay (HOSPITAL_COMMUNITY)
Admission: EM | Admit: 2021-01-09 | Discharge: 2021-01-12 | DRG: 244 | Disposition: A | Discharge status: Home Health | Payer: Medicare Other | Attending: Internal Medicine | Admitting: Internal Medicine

## 2021-01-09 ENCOUNTER — Emergency Department (HOSPITAL_COMMUNITY): Payer: Medicare Other

## 2021-01-09 ENCOUNTER — Other Ambulatory Visit: Payer: Self-pay

## 2021-01-09 ENCOUNTER — Encounter (HOSPITAL_COMMUNITY): Payer: Self-pay

## 2021-01-09 DIAGNOSIS — I350 Nonrheumatic aortic (valve) stenosis: Secondary | ICD-10-CM | POA: Diagnosis not present

## 2021-01-09 DIAGNOSIS — Z79891 Long term (current) use of opiate analgesic: Secondary | ICD-10-CM | POA: Diagnosis not present

## 2021-01-09 DIAGNOSIS — I441 Atrioventricular block, second degree: Secondary | ICD-10-CM | POA: Diagnosis not present

## 2021-01-09 DIAGNOSIS — R531 Weakness: Secondary | ICD-10-CM

## 2021-01-09 DIAGNOSIS — Z20822 Contact with and (suspected) exposure to covid-19: Secondary | ICD-10-CM | POA: Diagnosis not present

## 2021-01-09 DIAGNOSIS — J80 Acute respiratory distress syndrome: Secondary | ICD-10-CM | POA: Diagnosis not present

## 2021-01-09 DIAGNOSIS — I119 Hypertensive heart disease without heart failure: Secondary | ICD-10-CM | POA: Diagnosis not present

## 2021-01-09 DIAGNOSIS — R001 Bradycardia, unspecified: Secondary | ICD-10-CM | POA: Diagnosis present

## 2021-01-09 DIAGNOSIS — R42 Dizziness and giddiness: Secondary | ICD-10-CM | POA: Diagnosis not present

## 2021-01-09 DIAGNOSIS — Z96651 Presence of right artificial knee joint: Secondary | ICD-10-CM | POA: Diagnosis present

## 2021-01-09 DIAGNOSIS — I52 Other heart disorders in diseases classified elsewhere: Secondary | ICD-10-CM | POA: Diagnosis not present

## 2021-01-09 DIAGNOSIS — Z885 Allergy status to narcotic agent status: Secondary | ICD-10-CM

## 2021-01-09 DIAGNOSIS — M79606 Pain in leg, unspecified: Secondary | ICD-10-CM | POA: Diagnosis not present

## 2021-01-09 DIAGNOSIS — J9811 Atelectasis: Secondary | ICD-10-CM | POA: Diagnosis not present

## 2021-01-09 DIAGNOSIS — Z7901 Long term (current) use of anticoagulants: Secondary | ICD-10-CM

## 2021-01-09 DIAGNOSIS — E890 Postprocedural hypothyroidism: Secondary | ICD-10-CM | POA: Diagnosis present

## 2021-01-09 DIAGNOSIS — Z86711 Personal history of pulmonary embolism: Secondary | ICD-10-CM

## 2021-01-09 DIAGNOSIS — I4430 Unspecified atrioventricular block: Secondary | ICD-10-CM | POA: Diagnosis present

## 2021-01-09 DIAGNOSIS — R55 Syncope and collapse: Secondary | ICD-10-CM | POA: Diagnosis not present

## 2021-01-09 DIAGNOSIS — Z86718 Personal history of other venous thrombosis and embolism: Secondary | ICD-10-CM

## 2021-01-09 DIAGNOSIS — I499 Cardiac arrhythmia, unspecified: Secondary | ICD-10-CM | POA: Diagnosis not present

## 2021-01-09 DIAGNOSIS — Z79890 Hormone replacement therapy (postmenopausal): Secondary | ICD-10-CM | POA: Diagnosis not present

## 2021-01-09 DIAGNOSIS — I440 Atrioventricular block, first degree: Secondary | ICD-10-CM | POA: Diagnosis not present

## 2021-01-09 DIAGNOSIS — Z79899 Other long term (current) drug therapy: Secondary | ICD-10-CM

## 2021-01-09 DIAGNOSIS — Z950 Presence of cardiac pacemaker: Secondary | ICD-10-CM

## 2021-01-09 DIAGNOSIS — G629 Polyneuropathy, unspecified: Secondary | ICD-10-CM | POA: Diagnosis present

## 2021-01-09 LAB — BASIC METABOLIC PANEL
Anion gap: 6 (ref 5–15)
BUN: 13 mg/dL (ref 8–23)
CO2: 26 mmol/L (ref 22–32)
Calcium: 9 mg/dL (ref 8.9–10.3)
Chloride: 108 mmol/L (ref 98–111)
Creatinine, Ser: 0.82 mg/dL (ref 0.44–1.00)
GFR, Estimated: >60 mL/min (ref >60)
Glucose, Bld: 87 mg/dL (ref 70–99)
Potassium: 3.9 mmol/L (ref 3.5–5.1)
Sodium: 140 mmol/L (ref 135–145)

## 2021-01-09 LAB — MAGNESIUM: Magnesium: 2 mg/dL (ref 1.7–2.4)

## 2021-01-09 LAB — CBC
HCT: 38.5 % (ref 36.0–46.0)
Hemoglobin: 12.6 g/dL (ref 12.0–15.0)
MCH: 27.8 pg (ref 26.0–34.0)
MCHC: 32.7 g/dL (ref 30.0–36.0)
MCV: 85 fL (ref 80.0–100.0)
Platelets: 172 10*3/uL (ref 150–400)
RBC: 4.53 MIL/uL (ref 3.87–5.11)
RDW: 14.8 % (ref 11.5–15.5)
WBC: 4.7 10*3/uL (ref 4.0–10.5)
nRBC: 0 % (ref 0.0–0.2)

## 2021-01-09 LAB — TSH: TSH: 1.327 u[IU]/mL (ref 0.350–4.500)

## 2021-01-09 LAB — TROPONIN I (HIGH SENSITIVITY)
Troponin I (High Sensitivity): <2 ng/L (ref <18)
Troponin I (High Sensitivity): <2 ng/L (ref <18)

## 2021-01-09 LAB — PHOSPHORUS: Phosphorus: 3.1 mg/dL (ref 2.5–4.6)

## 2021-01-09 MED ORDER — SODIUM CHLORIDE 0.9 % IV SOLN: 250.0000 mL | INTRAVENOUS | Status: DC | PRN

## 2021-01-09 MED ORDER — PANTOPRAZOLE SODIUM 40 MG PO TBEC
40.0000 mg | DELAYED_RELEASE_TABLET | Freq: Every day | ORAL | Status: DC
  Administered 2021-01-10 – 2021-01-12 (×3): 40 mg via ORAL
  Filled 2021-01-09 (×3): qty 1

## 2021-01-09 MED ORDER — POLYVINYL ALCOHOL 1.4 % OP SOLN: 1.0000 [drp] | OPHTHALMIC | Status: DC | PRN

## 2021-01-09 MED ORDER — ONDANSETRON HCL 4 MG/2ML IJ SOLN
4.0000 mg | Freq: Four times a day (QID) | INTRAMUSCULAR | Status: DC | PRN
Start: 2021-01-09 — End: 2021-01-11
  Administered 2021-01-10: 4 mg via INTRAVENOUS
  Filled 2021-01-09: qty 2

## 2021-01-09 MED ORDER — SODIUM CHLORIDE 0.9% FLUSH
3.0000 mL | Freq: Two times a day (BID) | INTRAVENOUS | Status: DC
  Administered 2021-01-10 – 2021-01-12 (×4): 3 mL via INTRAVENOUS

## 2021-01-09 MED ORDER — ATROPINE SULFATE 1 MG/ML IJ SOLN
1.0000 mg | Freq: Once | INTRAMUSCULAR | Status: DC
  Filled 2021-01-09: qty 1

## 2021-01-09 MED ORDER — SODIUM CHLORIDE 0.9% FLUSH: 3.0000 mL | INTRAVENOUS | Status: DC | PRN

## 2021-01-09 MED ORDER — ATROPINE SULFATE 1 MG/ML IJ SOLN
1.0000 mg | INTRAMUSCULAR | Status: DC | PRN
  Filled 2021-01-09: qty 1

## 2021-01-09 MED ORDER — ACETAMINOPHEN 325 MG PO TABS
650.0000 mg | ORAL_TABLET | ORAL | Status: DC | PRN
  Administered 2021-01-10 (×3): 650 mg via ORAL
  Filled 2021-01-09 (×3): qty 2

## 2021-01-09 MED ORDER — TRAMADOL HCL 50 MG PO TABS
50.0000 mg | ORAL_TABLET | Freq: Four times a day (QID) | ORAL | Status: DC | PRN
  Administered 2021-01-11: 50 mg via ORAL
  Filled 2021-01-09: qty 1

## 2021-01-09 MED ORDER — MIRTAZAPINE 15 MG PO TABS
15.0000 mg | ORAL_TABLET | Freq: Every day | ORAL | Status: DC
  Administered 2021-01-09 – 2021-01-11 (×3): 15 mg via ORAL
  Filled 2021-01-09 (×3): qty 1

## 2021-01-09 MED ORDER — LEVOTHYROXINE SODIUM 75 MCG PO TABS
75.0000 ug | ORAL_TABLET | Freq: Every day | ORAL | Status: DC
  Administered 2021-01-10 – 2021-01-12 (×3): 75 ug via ORAL
  Filled 2021-01-09 (×3): qty 1

## 2021-01-09 MED ORDER — PREGABALIN 75 MG PO CAPS
75.0000 mg | ORAL_CAPSULE | Freq: Two times a day (BID) | ORAL | Status: DC
  Administered 2021-01-09 – 2021-01-12 (×6): 75 mg via ORAL
  Filled 2021-01-09 (×6): qty 1

## 2021-01-09 MED ORDER — SALINE SPRAY 0.65 % NA SOLN: 1.0000 | NASAL | Status: DC | PRN

## 2021-01-09 MED ORDER — APIXABAN 5 MG PO TABS
5.0000 mg | ORAL_TABLET | Freq: Two times a day (BID) | ORAL | Status: DC
  Administered 2021-01-09: 5 mg via ORAL
  Filled 2021-01-09 (×2): qty 1

## 2021-01-09 NOTE — ED Provider Notes (Signed)
[facility] EMERGENCY DEPARTMENT Provider Note   CSN: [id number] Arrival date & time: 01/09/21  1410     History Chief Complaint  Patient presents with  . Near Syncope    [name] is a 87 y.o. female.  HPI Patient has no history of heart problems.  She lives home with her daughter.  She has been doing well recently.  4 days ago however she started to experience some episodes of extreme fatigue, shortness of breath and lightheadedness with exertion.  She then noted episodes of her heart racing.  Patient was being seen by her PCP 4 days ago on a video conference call.  Sound symptoms she was advised to come to the emergency department.  She is identified at that time have a low heart rate in the 40s.  They continue to monitor it however her heart rate picked up the following day and was in the 70s and she felt better.  Today however she again got symptoms of feeling extremely lightheaded and like her heart was racing, intermittently.  Heart rates were checked again today and again they had gone down to low 40s.  EMS was called.  EMS identified rhythm changes and obtained strips.        Past Medical History:  Diagnosis Date  . Central retinal artery occlusion of right eye 2015   lost complete vision  . Edema   . Hypertension    not on BP medication currently  . Neuropathy   . Post-surgical hypothyroidism   . Pulmonary emboli ([place])   . VTE (venous thromboembolism) 12/2018   LLE DVT and PE    Patient Active Problem List   Diagnosis Date Noted  . AV block 01/09/2021  . Fall at home, initial encounter 09/27/2020  . Post-surgical hypothyroidism   . Neuropathy   . Hypertension   . VTE (venous thromboembolism) 12/2018    Past Surgical History:  Procedure Laterality Date  . REPLACEMENT TOTAL KNEE Right 2008  . TOTAL THYROIDECTOMY       OB History   No obstetric history on file.     No family history on file.  Social History   Tobacco Use  .  Smoking status: Never Smoker  . Smokeless tobacco: Never Used  Substance Use Topics  . Alcohol use: Never  . Drug use: Never    Home Medications Prior to Admission medications   Medication Sig Start Date End Date Taking? Authorizing Provider  acetaminophen (TYLENOL) 500 MG tablet Take 2 tablets (1,000 mg total) by mouth every 8 (eight) hours. Patient taking differently: Take 1,000 mg by mouth every 8 (eight) hours as needed for mild pain (or discomfort). 10/01/20  Yes Gonfa, Charlesetta Ivory, MD  ELIQUIS 5 MG TABS tablet Take 5 mg by mouth 2 (two) times daily.   Yes [provider]  furosemide (LASIX) 20 MG tablet Take 20 mg by mouth daily as needed for fluid or edema.   Yes [provider]  HYDROcodone-acetaminophen (NORCO/VICODIN) 5-325 MG tablet Take 0.5-1 tablets by mouth every 6 (six) hours as needed for moderate pain.   Yes [provider]  levothyroxine (SYNTHROID) 75 MCG tablet Take 75 mcg by mouth daily before breakfast.   Yes [provider]  magnesium hydroxide (MILK OF MAGNESIA) 400 MG/5ML suspension Take 15-30 mLs by mouth daily as needed for mild constipation.   Yes [provider]  meclizine (ANTIVERT) 25 MG tablet Take 25 mg by mouth 3 (three) times daily as  needed for dizziness.   Yes [provider]  mirtazapine (REMERON) 15 MG tablet Take 15 mg by mouth at bedtime.   Yes [provider]  NON FORMULARY Take 30-60 mLs by mouth See admin instructions. Prune juice- Drink 30-60 ml's by mouth as needed for constipation   Yes [provider]  omeprazole (PRILOSEC) 20 MG capsule Take 20 mg by mouth daily after lunch.   Yes [provider]  polyvinyl alcohol (LIQUIFILM TEARS) 1.4 % ophthalmic solution Place 1 drop into both eyes as needed for dry eyes. 10/05/20  Yes Mercy Riding, MD  pregabalin (LYRICA) 75 MG capsule Take 1 capsule (75 mg total) by mouth 2 (two) times daily. 10/05/20  Yes Mercy Riding, MD  sodium  chloride (OCEAN) 0.65 % SOLN nasal spray Place 1 spray into both nostrils as needed for congestion.   Yes [provider]  traMADol (ULTRAM) 50 MG tablet Take 50 mg by mouth every 6 (six) hours as needed for moderate pain or severe pain.   Yes [provider]  docusate sodium (COLACE) 100 MG capsule Take 1 capsule (100 mg total) by mouth 2 (two) times daily. Patient not taking: No sig reported 10/05/20   Mercy Riding, MD  hydrochlorothiazide (MICROZIDE) 12.5 MG capsule Take 1 capsule (12.5 mg total) by mouth daily. Patient not taking: No sig reported 10/05/20 10/05/21  Mercy Riding, MD  magnesium citrate SOLN Take 296 mLs (1 Bottle total) by mouth daily as needed for severe constipation. Patient not taking: No sig reported 10/05/20   Mercy Riding, MD  polyethylene glycol (MIRALAX / GLYCOLAX) 17 g packet Take 17 g by mouth 2 (two) times daily as needed for mild constipation. Patient not taking: No sig reported 10/05/20   Mercy Riding, MD  senna-docusate (SENOKOT-S) 8.6-50 MG tablet Take 2 tablets by mouth 2 (two) times daily as needed for moderate constipation. Patient not taking: No sig reported 10/01/20   Mercy Riding, MD  Vitamin D, Ergocalciferol, (DRISDOL) 1.25 MG (50000 UNIT) CAPS capsule Take 1 capsule (50,000 Units total) by mouth every 7 (seven) days. Patient not taking: No sig reported 10/06/20   Mercy Riding, MD    Allergies    Codeine  Review of Systems   Review of Systems 10 systems reviewed negative except as per HPI Physical Exam Updated Vital Signs BP 139/73 (BP Location: Right Arm)   Pulse 76   Temp 98.5 F (36.9 C) (Oral)   Resp 20   Ht 5\' 7"  (1.702 m)   Wt 93.4 kg   SpO2 96%   BMI 32.25 kg/m   Physical Exam Constitutional:      Appearance: Normal appearance. She is well-developed.  HENT:     Head: Normocephalic and atraumatic.     Mouth/Throat:     Pharynx: Oropharynx is clear.  Eyes:     Pupils: Pupils are equal, round, and reactive  to light.  Cardiovascular:     Rate and Rhythm: Normal rate and regular rhythm.     Heart sounds: Normal heart sounds.  Pulmonary:     Effort: Pulmonary effort is normal.     Breath sounds: Normal breath sounds.  Abdominal:     General: Bowel sounds are normal. There is no distension.     Palpations: Abdomen is soft.     Tenderness: There is no abdominal tenderness.  Musculoskeletal:        General: Normal range of motion.  Cervical back: Neck supple.  Skin:    General: Skin is warm and dry.  Neurological:     Mental Status: She is alert and oriented to person, place, and time.     GCS: GCS eye subscore is 4. GCS verbal subscore is 5. GCS motor subscore is 6.     Coordination: Coordination normal.     ED Results / Procedures / Treatments   Labs (all labs ordered are listed, but only abnormal results are displayed) Labs Reviewed  BASIC METABOLIC PANEL  CBC  TSH  MAGNESIUM  PHOSPHORUS  URINALYSIS, ROUTINE W REFLEX MICROSCOPIC  BASIC METABOLIC PANEL  TROPONIN I (HIGH SENSITIVITY)  TROPONIN I (HIGH SENSITIVITY)    EKG EKG Interpretation  Date/Time:  Saturday January 09 2021 14:24:23 EDT Ventricular Rate:  81 PR Interval:    QRS Duration: 90 QT Interval:  372 QTC Calculation: 432 R Axis:   72 Text Interpretation: Sinus rhythm Prolonged PR interval agree, no change from prevous Confirmed by Charlesetta Shanks [phone]) on 01/09/2021 3:55:43 PM   Radiology DG Chest Port 1 View  Result Date: 01/09/2021 CLINICAL DATA:  Palpitation, near syncopal episode 2 days ago. EXAM: PORTABLE CHEST 1 VIEW COMPARISON:  Chest radiograph dated 12/24/2020. FINDINGS: The heart size. Vascular calcifications are seen in the aortic arch. The left hemidiaphragm is elevated relative to the right, unchanged. Both lungs are clear. Degenerative changes are seen in the spine. IMPRESSION: No active disease. Electronically Signed   By: Zerita Boers M.D.   On: 01/09/2021 16:41    Procedures Procedures    Medications Ordered in ED Medications  traMADol (ULTRAM) tablet 50 mg (has no administration in time range)  mirtazapine (REMERON) tablet 15 mg (has no administration in time range)  levothyroxine (SYNTHROID) tablet 75 mcg (has no administration in time range)  pantoprazole (PROTONIX) EC tablet 40 mg (has no administration in time range)  apixaban (ELIQUIS) tablet 5 mg (has no administration in time range)  pregabalin (LYRICA) capsule 75 mg (has no administration in time range)  polyvinyl alcohol (LIQUIFILM TEARS) 1.4 % ophthalmic solution 1 drop (has no administration in time range)  sodium chloride (OCEAN) 0.65 % nasal spray 1 spray (has no administration in time range)  sodium chloride flush (NS) 0.9 % injection 3 mL (has no administration in time range)  sodium chloride flush (NS) 0.9 % injection 3 mL (has no administration in time range)  0.9 %  sodium chloride infusion (has no administration in time range)  acetaminophen (TYLENOL) tablet 650 mg (has no administration in time range)  ondansetron (ZOFRAN) injection 4 mg (has no administration in time range)  atropine injection 1 mg (has no administration in time range)    ED Course  I have reviewed the triage vital signs and the nursing notes.  Pertinent labs & imaging results that were available during my care of the patient were reviewed by me and considered in my medical decision making (see chart for details).    MDM Rules/Calculators/A&P                          Patient is at baseline a healthy 87 year old.  The past 4 days she has been getting exertional shortness of breath and lightheadedness.  Initially, slow heart rate was identified via a video call.  There was no tracing at that time.  Patient then had improvement of symptoms on the following day.  Today she again had worsening symptoms with both perception  of racing heart and then episodes of feeling lightheaded or near syncopal and very fatigued.  She is not  experiencing chest pain with this.  She does not have cardiac history.  EKG done in the emergency department is normal in appearance, however, EMS obtain rhythm strips that suggest second-degree AV block Mobitz 1.  They have identified both slow rhythms and perception of fast heart rate subjectively.  Possibly patient is also having tachybradycardia syndrome.  Will consult cardiology for further evaluation.  Final disposition per cardiology consult. Final Clinical Impression(s) / ED Diagnoses Final diagnoses:  Wenckebach second degree AV block  Near syncope  Weakness    Rx / DC Orders ED Discharge Orders    None       Charlesetta Shanks, MD 01/09/21 2157

## 2021-01-09 NOTE — ED Notes (Signed)
Unsuccessful attempt to give report  Told to call back in 10 minutes

## 2021-01-09 NOTE — Plan of Care (Signed)
  Problem: Education: Goal: Knowledge of General Education information will improve Description: Including pain rating scale, medication(s)/side effects and non-pharmacologic comfort measures Outcome: Progressing   Problem: Health Behavior/Discharge Planning: Goal: Ability to manage health-related needs will improve Outcome: Progressing   Problem: Clinical Measurements: Goal: Diagnostic test results will improve Outcome: Progressing Goal: Respiratory complications will improve Outcome: Progressing   Problem: Activity: Goal: Risk for activity intolerance will decrease Outcome: Progressing

## 2021-01-09 NOTE — ED Notes (Signed)
2nd attempt to call report was told unknown if anyone had looked at report.  No one ever  Came back to the phone

## 2021-01-09 NOTE — H&P (Signed)
Cardiology Admission History and Physical:   Patient ID: [name] MRN: [id number]; DOB: Mar 04, 1927   Admission date: 01/09/2021  Primary Care Provider: [name], DO Primary Cardiologist: No primary care provider on file.  Primary Electrophysiologist:  None   Chief Complaint:  presyncope  Patient Profile:   [name] is a 87 y.o. female with HTN, VTE (on apixaban), hypothyroidism who presents with presyncope.   History of Present Illness:   Ms. Radich reports 2 episodes of presyncope over the past 2 days. Has never had anything like this before. These come out of nowhere without any clear provoking factors. Has not passed out but feels like she is going to. Lasts for several moments and then seems to get better. Has been taking all of her medications as prescribed, including apixaban which she has been on for several years. Blood pressures have been high with systolics in the 409'W or so. She denies any recent illnesses, travel, rashes, ingestions.   Patient contacted EMS after her episode today. ECG showed variable grade heart block with heart rates in the 30's and 40's. She was brought to the hospital for evaluation. ECG in the ED showed 1st degree AVB. She was noted to have frequent HR's in the 40's on telemetry with a predominant rhythm of 2nd degree AVB. Her labs were unremarkable. Troponin negative. CXR normal. She was admitted for treatment.   Heart Pathway Score:     Past Medical History:  Diagnosis Date  . Central retinal artery occlusion of right eye 2015   lost complete vision  . Edema   . Hypertension    not on BP medication currently  . Neuropathy   . Post-surgical hypothyroidism   . Pulmonary emboli ([place])   . VTE (venous thromboembolism) 12/2018   LLE DVT and PE    Past Surgical History:  Procedure Laterality Date  . REPLACEMENT TOTAL KNEE Right 2008  . TOTAL THYROIDECTOMY       Medications Prior to Admission: Prior to Admission medications    Medication Sig Start Date End Date Taking? Authorizing Provider  acetaminophen (TYLENOL) 500 MG tablet Take 2 tablets (1,000 mg total) by mouth every 8 (eight) hours. Patient taking differently: Take 1,000 mg by mouth every 8 (eight) hours as needed for mild pain (or discomfort). 10/01/20  Yes Gonfa, Charlesetta Ivory, MD  ELIQUIS 5 MG TABS tablet Take 5 mg by mouth 2 (two) times daily.   Yes [provider]  furosemide (LASIX) 20 MG tablet Take 20 mg by mouth daily as needed for fluid or edema.   Yes [provider]  HYDROcodone-acetaminophen (NORCO/VICODIN) 5-325 MG tablet Take 0.5-1 tablets by mouth every 6 (six) hours as needed for moderate pain.   Yes [provider]  levothyroxine (SYNTHROID) 75 MCG tablet Take 75 mcg by mouth daily before breakfast.   Yes [provider]  magnesium hydroxide (MILK OF MAGNESIA) 400 MG/5ML suspension Take 15-30 mLs by mouth daily as needed for mild constipation.   Yes [provider]  meclizine (ANTIVERT) 25 MG tablet Take 25 mg by mouth 3 (three) times daily as needed for dizziness.   Yes [provider]  mirtazapine (REMERON) 15 MG tablet Take 15 mg by mouth at bedtime.   Yes [provider]  NON FORMULARY Take 30-60 mLs by mouth See admin instructions. Prune juice- Drink 30-60 ml's by mouth as needed for constipation   Yes [provider]  omeprazole (PRILOSEC) 20 MG capsule Take 20 mg by mouth  daily after lunch.   Yes [provider]  polyvinyl alcohol (LIQUIFILM TEARS) 1.4 % ophthalmic solution Place 1 drop into both eyes as needed for dry eyes. 10/05/20  Yes Mercy Riding, MD  pregabalin (LYRICA) 75 MG capsule Take 1 capsule (75 mg total) by mouth 2 (two) times daily. 10/05/20  Yes Mercy Riding, MD  sodium chloride (OCEAN) 0.65 % SOLN nasal spray Place 1 spray into both nostrils as needed for congestion.   Yes [provider]  traMADol (ULTRAM) 50 MG tablet Take 50 mg by mouth  every 6 (six) hours as needed for moderate pain or severe pain.   Yes [provider]  docusate sodium (COLACE) 100 MG capsule Take 1 capsule (100 mg total) by mouth 2 (two) times daily. Patient not taking: No sig reported 10/05/20   Mercy Riding, MD  hydrochlorothiazide (MICROZIDE) 12.5 MG capsule Take 1 capsule (12.5 mg total) by mouth daily. Patient not taking: No sig reported 10/05/20 10/05/21  Mercy Riding, MD  magnesium citrate SOLN Take 296 mLs (1 Bottle total) by mouth daily as needed for severe constipation. Patient not taking: No sig reported 10/05/20   Mercy Riding, MD  polyethylene glycol (MIRALAX / GLYCOLAX) 17 g packet Take 17 g by mouth 2 (two) times daily as needed for mild constipation. Patient not taking: No sig reported 10/05/20   Mercy Riding, MD  senna-docusate (SENOKOT-S) 8.6-50 MG tablet Take 2 tablets by mouth 2 (two) times daily as needed for moderate constipation. Patient not taking: No sig reported 10/01/20   Mercy Riding, MD  Vitamin D, Ergocalciferol, (DRISDOL) 1.25 MG (50000 UNIT) CAPS capsule Take 1 capsule (50,000 Units total) by mouth every 7 (seven) days. Patient not taking: No sig reported 10/06/20   Mercy Riding, MD     Allergies:    Allergies  Allergen Reactions  . Codeine Other (See Comments)    Caused vertigo to the point of passing out    Social History:   Social History   Socioeconomic History  . Marital status: Widowed    Spouse name: Not on file  . Number of children: Not on file  . Years of education: Not on file  . Highest education level: Not on file  Occupational History  . Occupation: retired  Tobacco Use  . Smoking status: Never Smoker  . Smokeless tobacco: Never Used  Substance and Sexual Activity  . Alcohol use: Never  . Drug use: Never  . Sexual activity: Not on file  Other Topics Concern  . Not on file  Social History Narrative   ** Merged History Encounter **       Social Determinants of Health    Financial Resource Strain: Not on file  Food Insecurity: Not on file  Transportation Needs: Not on file  Physical Activity: Not on file  Stress: Not on file  Social Connections: Not on file  Intimate Partner Violence: Not on file    Family History:   The patient's family history is not on file.    ROS:  Please see the history of present illness.  All other ROS reviewed and negative.     Physical Exam/Data:   Vitals:   01/09/21 1600 01/09/21 1615 01/09/21 1700 01/09/21 1715  BP: 137/81 (!) 152/72 137/71 139/70  Pulse: 76 79 76 78  Resp: 17 16 (!) 24 20  Temp:      TempSrc:      SpO2: 97% 99% 98%  98%  Weight:      Height:       No intake or output data in the 24 hours ending 01/09/21 1734 Last 3 Weights 01/09/2021 12/24/2020 09/27/2020  Weight (lbs) 199 lb 206 lb 2.1 oz 206 lb 2.1 oz  Weight (kg) 90.266 kg 93.5 kg 93.5 kg     Body mass index is 31.17 kg/m.  General:  Elderly, frail HEENT: normal Neck: no JVD Cardiac:  Bradycardic, faint systolic murmur Lungs:  clear to auscultation bilaterally, no wheezing, rhonchi or rales  Abd: soft, nontender, no hepatomegaly  Ext: 1+ pitting edema in bilateral ankles Skin: warm and dry    EKG:  The ECG that was done on arrival to ED was personally reviewed and demonstrates NSR, 1st degree AVB, otherwise unremarkable  Relevant CV Studies: None  Laboratory Data:  High Sensitivity Troponin:  No results for input(s): TROPONINIHS in the last 720 hours.    Chemistry Recent Labs  Lab 01/09/21 1420  NA 140  K 3.9  CL 108  CO2 26  GLUCOSE 87  BUN 13  CREATININE 0.82  CALCIUM 9.0  GFRNONAA >60  ANIONGAP 6    No results for input(s): PROT, ALBUMIN, AST, ALT, ALKPHOS, BILITOT in the last 168 hours. Hematology Recent Labs  Lab 01/09/21 1420  WBC 4.7  RBC 4.53  HGB 12.6  HCT 38.5  MCV 85.0  MCH 27.8  MCHC 32.7  RDW 14.8  PLT 172   BNPNo results for input(s): BNP, PROBNP in the last 168 hours.  DDimer No results  for input(s): DDIMER in the last 168 hours.   Radiology/Studies:  DG Chest Port 1 View  Result Date: 01/09/2021 CLINICAL DATA:  Palpitation, near syncopal episode 2 days ago. EXAM: PORTABLE CHEST 1 VIEW COMPARISON:  Chest radiograph dated 12/24/2020. FINDINGS: The heart size. Vascular calcifications are seen in the aortic arch. The left hemidiaphragm is elevated relative to the right, unchanged. Both lungs are clear. Degenerative changes are seen in the spine. IMPRESSION: No active disease. Electronically Signed   By: Zerita Boers M.D.   On: 01/09/2021 16:41   Assessment and Plan:  [name] is a 87 y.o. female with HTN, VTE (on apixaban), hypothyroidism who presents with presyncope, found to have high grade AV block.   #) Presyncope, bradycardia: high grade AV block noted on telemetry and ECG with HR's frequently in the low 40's. No clear reversible cause for AVB in this patient. No signs/symptoms suggestive of ACS. Discussed likely need for pacemaker implant in this patient. She is hesitant but understands that she is very high risk for a potentially fatal syncopal episode from bradycardia in the setting of chronic anticoagulant use.  - EP consult in AM - cont apixaban for now - telemetry  - bed rest, up with assistance - check TSH - echo in AM - atropine at bedside  #) HTN:  - avoid BB, CCB - monitor  #) History of VTE:  - cont apixaban for now  #) Home meds:  - cont home pregabalin, PPI, remeron  Severity of Illness: The appropriate patient status for this patient is INPATIENT. Inpatient status is judged to be reasonable and necessary in order to provide the required intensity of service to ensure the patient's safety. The patient's presenting symptoms, physical exam findings, and initial radiographic and laboratory data in the context of their chronic comorbidities is felt to place them at high risk for further clinical deterioration. Furthermore, it is not anticipated that  the  patient will be medically stable for discharge from the hospital within 2 midnights of admission. The following factors support the patient status of inpatient.   " The patient's presenting symptoms include presyncope. " The worrisome physical exam findings include bradycardia. " The initial radiographic and laboratory data are worrisome because of AV block. " The chronic co-morbidities include age, frailty.   * I certify that at the point of admission it is my clinical judgment that the patient will require inpatient hospital care spanning beyond 2 midnights from the point of admission due to high intensity of service, high risk for further deterioration and high frequency of surveillance required.*    For questions or updates, please contact [facility] Please consult [email or web address] for contact info under      Signed, Marcie Mowers, MD  01/09/2021 5:34 PM

## 2021-01-09 NOTE — ED Triage Notes (Signed)
Had near syncopal episode 2 days ago and HR in 40's and it happened again today.  Patient reports when this happens it takes her breath away.

## 2021-01-09 NOTE — Progress Notes (Signed)
Patient arrived to floor, oriented to room, call light. Vitals taken, patient cleaned, changed linens.   Daughter at bedside leaving for the night, states will bring TLSO brace in the morning.  Patient sat up for her dinner tray, no needs at this time. Call light within reach

## 2021-01-10 ENCOUNTER — Inpatient Hospital Stay (HOSPITAL_COMMUNITY): Payer: Medicare Other

## 2021-01-10 DIAGNOSIS — I350 Nonrheumatic aortic (valve) stenosis: Secondary | ICD-10-CM | POA: Diagnosis not present

## 2021-01-10 DIAGNOSIS — R001 Bradycardia, unspecified: Secondary | ICD-10-CM

## 2021-01-10 DIAGNOSIS — I52 Other heart disorders in diseases classified elsewhere: Secondary | ICD-10-CM | POA: Diagnosis not present

## 2021-01-10 LAB — SURGICAL PCR SCREEN
MRSA, PCR: NEGATIVE
Staphylococcus aureus: NEGATIVE

## 2021-01-10 LAB — ECHOCARDIOGRAM COMPLETE
AR max vel: 2.37 cm2
AV Area VTI: 2.31 cm2
AV Area mean vel: 2.33 cm2
AV Mean grad: 6 mmHg
AV Peak grad: 4.8 mmHg
Ao pk vel: 1.09 m/s
Area-P 1/2: 2.12 cm2
Height: 67 in
MV VTI: 1.05 cm2
S' Lateral: 2.3 cm
Weight: 3273.39 oz

## 2021-01-10 LAB — BASIC METABOLIC PANEL
Anion gap: 6 (ref 5–15)
BUN: 11 mg/dL (ref 8–23)
CO2: 28 mmol/L (ref 22–32)
Calcium: 8.9 mg/dL (ref 8.9–10.3)
Chloride: 106 mmol/L (ref 98–111)
Creatinine, Ser: 0.76 mg/dL (ref 0.44–1.00)
GFR, Estimated: >60 mL/min (ref >60)
Glucose, Bld: 104 mg/dL — ABNORMAL HIGH (ref 70–99)
Potassium: 3.9 mmol/L (ref 3.5–5.1)
Sodium: 140 mmol/L (ref 135–145)

## 2021-01-10 MED ORDER — SODIUM CHLORIDE 0.9 % IV SOLN
80.0000 mg | INTRAVENOUS | Status: AC
  Administered 2021-01-11: 80 mg
  Filled 2021-01-10: qty 2

## 2021-01-10 MED ORDER — CEFAZOLIN SODIUM-DEXTROSE 2-4 GM/100ML-% IV SOLN
2.0000 g | INTRAVENOUS | Status: AC
  Administered 2021-01-11: 2 g via INTRAVENOUS
  Filled 2021-01-10: qty 100

## 2021-01-10 MED ORDER — SODIUM CHLORIDE 0.9 % IV SOLN
INTRAVENOUS | Status: DC
  Administered 2021-01-11: 50 mL/h via INTRAVENOUS

## 2021-01-10 MED ORDER — CHLORHEXIDINE GLUCONATE 4 % EX LIQD
60.0000 mL | Freq: Once | CUTANEOUS | Status: AC
  Administered 2021-01-10: 4 via TOPICAL
  Filled 2021-01-10: qty 60

## 2021-01-10 MED ORDER — CHLORHEXIDINE GLUCONATE 4 % EX LIQD
60.0000 mL | Freq: Once | CUTANEOUS | Status: DC
  Administered 2021-01-11: 4 via TOPICAL
  Filled 2021-01-10: qty 60

## 2021-01-10 MED ORDER — PERFLUTREN LIPID MICROSPHERE
1.0000 mL | INTRAVENOUS | Status: AC | PRN
  Administered 2021-01-10: 3 mL via INTRAVENOUS
  Filled 2021-01-10: qty 10

## 2021-01-10 NOTE — Consult Note (Signed)
Cardiology Consultation:   Patient ID: [name] MRN: [id number]; DOB: 1927-06-17  Admit date: 01/09/2021 Date of Consult: 01/10/2021  PCP:  [name], [facility]  Cardiologist:  No primary care provider on file. None Advanced Practice Provider:  No care team member to display Electrophysiologist:  None     Patient Profile:   [name] is a 87 y.o. female with a hx of HTN who is being seen today for the evaluation of near syncope and symptomatic mobitz 2, second degree AV block at the request of Dr. Neena Rhymes.  History of Present Illness:   [name] is a very pleasant 87 year old woman with hypertension who looks younger than her stated age, who was admitted to the hospital with worsening episodes of presyncope and found to have 2-1 AV block.  The patient has not had frank syncope.  She has been off of any AV nodal blocking drugs.  She does have a history of pulmonary emboli and is on systemic anticoagulation.  In the emergency room she was noted to have variable AV block with periods of 2-1 heart block.  She also has a long first-degree AV block at times.  Cardiac enzymes were unremarkable.     Past Medical History:  Diagnosis Date  . Central retinal artery occlusion of right eye 2015   lost complete vision  . Edema   . Hypertension    not on BP medication currently  . Neuropathy   . Post-surgical hypothyroidism   . Pulmonary emboli ([place])   . VTE (venous thromboembolism) 12/2018   LLE DVT and PE    Past Surgical History:  Procedure Laterality Date  . REPLACEMENT TOTAL KNEE Right 2008  . TOTAL THYROIDECTOMY       Home Medications:  Prior to Admission medications   Medication Sig Start Date End Date Taking? Authorizing Provider  acetaminophen (TYLENOL) 500 MG tablet Take 2 tablets (1,000 mg total) by mouth every 8 (eight) hours. Patient taking differently: Take 1,000 mg by mouth every 8 (eight) hours as needed for mild  pain (or discomfort). 10/01/20  Yes Gonfa, Charlesetta Ivory, MD  ELIQUIS 5 MG TABS tablet Take 5 mg by mouth 2 (two) times daily.   Yes [provider]  furosemide (LASIX) 20 MG tablet Take 20 mg by mouth daily as needed for fluid or edema.   Yes [provider]  HYDROcodone-acetaminophen (NORCO/VICODIN) 5-325 MG tablet Take 0.5-1 tablets by mouth every 6 (six) hours as needed for moderate pain.   Yes [provider]  levothyroxine (SYNTHROID) 75 MCG tablet Take 75 mcg by mouth daily before breakfast.   Yes [provider]  magnesium hydroxide (MILK OF MAGNESIA) 400 MG/5ML suspension Take 15-30 mLs by mouth daily as needed for mild constipation.   Yes [provider]  meclizine (ANTIVERT) 25 MG tablet Take 25 mg by mouth 3 (three) times daily as needed for dizziness.   Yes [provider]  mirtazapine (REMERON) 15 MG tablet Take 15 mg by mouth at bedtime.   Yes [provider]  NON FORMULARY Take 30-60 mLs by mouth See admin instructions. Prune juice- Drink 30-60 ml's by mouth as needed for constipation   Yes [provider]  omeprazole (PRILOSEC) 20 MG capsule Take 20 mg by mouth daily after lunch.   Yes [provider]  polyvinyl alcohol (LIQUIFILM TEARS) 1.4 % ophthalmic solution Place 1 drop into both eyes as needed for dry eyes. 10/05/20  Yes  Mercy Riding, MD  pregabalin (LYRICA) 75 MG capsule Take 1 capsule (75 mg total) by mouth 2 (two) times daily. 10/05/20  Yes Mercy Riding, MD  sodium chloride (OCEAN) 0.65 % SOLN nasal spray Place 1 spray into both nostrils as needed for congestion.   Yes [provider]  traMADol (ULTRAM) 50 MG tablet Take 50 mg by mouth every 6 (six) hours as needed for moderate pain or severe pain.   Yes [provider]  docusate sodium (COLACE) 100 MG capsule Take 1 capsule (100 mg total) by mouth 2 (two) times daily. Patient not taking: No sig reported 10/05/20   Mercy Riding, MD   hydrochlorothiazide (MICROZIDE) 12.5 MG capsule Take 1 capsule (12.5 mg total) by mouth daily. Patient not taking: No sig reported 10/05/20 10/05/21  Mercy Riding, MD  magnesium citrate SOLN Take 296 mLs (1 Bottle total) by mouth daily as needed for severe constipation. Patient not taking: No sig reported 10/05/20   Mercy Riding, MD  polyethylene glycol (MIRALAX / GLYCOLAX) 17 g packet Take 17 g by mouth 2 (two) times daily as needed for mild constipation. Patient not taking: No sig reported 10/05/20   Mercy Riding, MD  senna-docusate (SENOKOT-S) 8.6-50 MG tablet Take 2 tablets by mouth 2 (two) times daily as needed for moderate constipation. Patient not taking: No sig reported 10/01/20   Mercy Riding, MD  Vitamin D, Ergocalciferol, (DRISDOL) 1.25 MG (50000 UNIT) CAPS capsule Take 1 capsule (50,000 Units total) by mouth every 7 (seven) days. Patient not taking: No sig reported 10/06/20   Mercy Riding, MD    Inpatient Medications: Scheduled Meds: . apixaban  5 mg Oral BID  . levothyroxine  75 mcg Oral QAC breakfast  . mirtazapine  15 mg Oral QHS  . pantoprazole  40 mg Oral Daily  . pregabalin  75 mg Oral BID  . sodium chloride flush  3 mL Intravenous Q12H   Continuous Infusions: . sodium chloride     PRN Meds: sodium chloride, acetaminophen, atropine, ondansetron (ZOFRAN) IV, perflutren lipid microspheres (DEFINITY) IV suspension, polyvinyl alcohol, sodium chloride, sodium chloride flush, traMADol  Allergies:    Allergies  Allergen Reactions  . Codeine Other (See Comments)    Caused vertigo to the point of passing out    Social History:   Social History   Socioeconomic History  . Marital status: Widowed    Spouse name: Not on file  . Number of children: Not on file  . Years of education: Not on file  . Highest education level: Not on file  Occupational History  . Occupation: retired  Tobacco Use  . Smoking status: Never Smoker  . Smokeless tobacco: Never Used   Substance and Sexual Activity  . Alcohol use: Never  . Drug use: Never  . Sexual activity: Not on file  Other Topics Concern  . Not on file  Social History Narrative   ** Merged History Encounter **       Social Determinants of Health   Financial Resource Strain: Not on file  Food Insecurity: Not on file  Transportation Needs: Not on file  Physical Activity: Not on file  Stress: Not on file  Social Connections: Not on file  Intimate Partner Violence: Not on file    Family History:   History reviewed. No pertinent family history.   ROS:  Please see the history of present illness.   All other ROS reviewed and negative.  Physical Exam/Data:   Vitals:   01/10/21 0800 01/10/21 0840 01/10/21 0900 01/10/21 1120  BP: 122/67 121/74 116/65 115/62  Pulse: 78 (!) 35 79 70  Resp: 18 16  19   Temp: 98.3 F (36.8 C)   98 F (36.7 C)  TempSrc: Oral   Oral  SpO2: 95% 95% 97% 97%  Weight:      Height:        Intake/Output Summary (Last 24 hours) at 01/10/2021 1157 Last data filed at 01/10/2021 0900 Gross per 24 hour  Intake 340 ml  Output 450 ml  Net -110 ml   Last 3 Weights 01/10/2021 01/10/2021 01/09/2021  Weight (lbs) 204 lb 9.4 oz 204 lb 9.4 oz 205 lb 14.6 oz  Weight (kg) 92.8 kg 92.8 kg 93.4 kg     Body mass index is 32.04 kg/m.  General:  Well nourished, well developed, in no acute distress HEENT: normal Lymph: no adenopathy Neck: no JVD Endocrine:  No thryomegaly Vascular: No carotid bruits; FA pulses 2+ bilaterally without bruits  Cardiac:  normal S1, S2; RRR; 2/6 systolic murmur  Lungs:  clear to auscultation bilaterally, no wheezing, rhonchi or rales  Abd: soft, nontender, no hepatomegaly  Ext: no edema Musculoskeletal:  No deformities, BUE and BLE strength normal and equal Skin: warm and dry  Neuro:  CNs 2-12 intact, no focal abnormalities noted Psych:  Normal affect   EKG:  The EKG was personally reviewed and demonstrates: Normal sinus rhythm with 2-1  AV block Telemetry:  Telemetry was personally reviewed and demonstrates: Normal sinus rhythm with first-degree AV block, AV Wenke block, and 2-1 AV block  Relevant CV Studies: 2D echo results are pending  Laboratory Data:  High Sensitivity Troponin:   Recent Labs  Lab 01/09/21 1420 01/09/21 2100  TROPONINIHS <2 <2     Chemistry Recent Labs  Lab 01/09/21 1420 01/10/21 0232  NA 140 140  K 3.9 3.9  CL 108 106  CO2 26 28  GLUCOSE 87 104*  BUN 13 11  CREATININE 0.82 0.76  CALCIUM 9.0 8.9  GFRNONAA >60 >60  ANIONGAP 6 6    No results for input(s): PROT, ALBUMIN, AST, ALT, ALKPHOS, BILITOT in the last 168 hours. Hematology Recent Labs  Lab 01/09/21 1420  WBC 4.7  RBC 4.53  HGB 12.6  HCT 38.5  MCV 85.0  MCH 27.8  MCHC 32.7  RDW 14.8  PLT 172   BNPNo results for input(s): BNP, PROBNP in the last 168 hours.  DDimer No results for input(s): DDIMER in the last 168 hours.   Radiology/Studies:  DG Chest Port 1 View  Result Date: 01/09/2021 CLINICAL DATA:  Palpitation, near syncopal episode 2 days ago. EXAM: PORTABLE CHEST 1 VIEW COMPARISON:  Chest radiograph dated 12/24/2020. FINDINGS: The heart size. Vascular calcifications are seen in the aortic arch. The left hemidiaphragm is elevated relative to the right, unchanged. Both lungs are clear. Degenerative changes are seen in the spine. IMPRESSION: No active disease. Electronically Signed   By: Zerita Boers M.D.   On: 01/09/2021 16:41     Assessment and Plan:   1. High-grade heart block -the patient has symptomatic 2-1 heart block.  I discussed the treatment options in detail.  The risk, goals, benefits, and expectations of pacemaker insertion were reviewed and she wishes to proceed. 2. Hypertension -following pacemaker insertion, she will be reinitiated on a beta-blocker, likely carvedilol 3. History of pulmonary emboli -her systemic anticoagulation will be held prior to her pacemaker insertion  and for several days  after.     For questions or updates, please contact [place] Please consult [email or web address] for contact info under   Signed, Cristopher Peru, MD  01/10/2021 11:57 AM

## 2021-01-10 NOTE — Progress Notes (Signed)
Patient complaining of nausea. Notified by CCMD that patient is sustaining a Second degree type II. 12 lead EKG captured patient rhythm with second degree type II and transmitted to Epic results. Rounding cardiology aware. Patient does not have complaints of dizziness or lightheadedness like at home

## 2021-01-10 NOTE — Progress Notes (Signed)
  Echocardiogram 2D Echocardiogram has been performed with Definity.  [name] 01/10/2021, 10:43 AM

## 2021-01-11 ENCOUNTER — Inpatient Hospital Stay (HOSPITAL_COMMUNITY)
Admission: EM | Disposition: A | Discharge status: Home Health | Payer: Self-pay | Source: Home / Self Care | Attending: Internal Medicine

## 2021-01-11 ENCOUNTER — Encounter (HOSPITAL_COMMUNITY): Payer: Self-pay | Admitting: Internal Medicine

## 2021-01-11 DIAGNOSIS — I441 Atrioventricular block, second degree: Principal | ICD-10-CM

## 2021-01-11 HISTORY — PX: PACEMAKER IMPLANT: EP1218

## 2021-01-11 LAB — BASIC METABOLIC PANEL
Anion gap: 4 — ABNORMAL LOW (ref 5–15)
BUN: 13 mg/dL (ref 8–23)
CO2: 27 mmol/L (ref 22–32)
Calcium: 8.8 mg/dL — ABNORMAL LOW (ref 8.9–10.3)
Chloride: 108 mmol/L (ref 98–111)
Creatinine, Ser: 0.83 mg/dL (ref 0.44–1.00)
GFR, Estimated: >60 mL/min (ref >60)
Glucose, Bld: 107 mg/dL — ABNORMAL HIGH (ref 70–99)
Potassium: 3.6 mmol/L (ref 3.5–5.1)
Sodium: 139 mmol/L (ref 135–145)

## 2021-01-11 LAB — SARS CORONAVIRUS 2 BY RT PCR (HOSPITAL ORDER, PERFORMED IN ~~LOC~~ HOSPITAL LAB): SARS Coronavirus 2: NEGATIVE

## 2021-01-11 SURGERY — PACEMAKER IMPLANT
Anesthesia: LOCAL

## 2021-01-11 MED ORDER — FENTANYL CITRATE (PF) 100 MCG/2ML IJ SOLN
INTRAMUSCULAR | Status: AC
  Filled 2021-01-11: qty 2

## 2021-01-11 MED ORDER — HEPARIN (PORCINE) IN NACL 1000-0.9 UT/500ML-% IV SOLN
INTRAVENOUS | Status: AC
  Filled 2021-01-11: qty 500

## 2021-01-11 MED ORDER — FENTANYL CITRATE (PF) 100 MCG/2ML IJ SOLN
INTRAMUSCULAR | Status: DC | PRN
  Administered 2021-01-11: 25 ug via INTRAVENOUS

## 2021-01-11 MED ORDER — CEFAZOLIN SODIUM-DEXTROSE 1-4 GM/50ML-% IV SOLN
1.0000 g | Freq: Four times a day (QID) | INTRAVENOUS | Status: AC
  Administered 2021-01-11 – 2021-01-12 (×3): 1 g via INTRAVENOUS
  Filled 2021-01-11 (×3): qty 50

## 2021-01-11 MED ORDER — SODIUM CHLORIDE 0.9 % IV SOLN
INTRAVENOUS | Status: AC | PRN
  Administered 2021-01-11: 50 mL/h via INTRAVENOUS

## 2021-01-11 MED ORDER — ONDANSETRON HCL 4 MG/2ML IJ SOLN: 4.0000 mg | Freq: Four times a day (QID) | INTRAMUSCULAR | Status: DC | PRN

## 2021-01-11 MED ORDER — LIDOCAINE HCL 1 % IJ SOLN
INTRAMUSCULAR | Status: AC
  Filled 2021-01-11: qty 60

## 2021-01-11 MED ORDER — CEFAZOLIN SODIUM-DEXTROSE 2-4 GM/100ML-% IV SOLN
INTRAVENOUS | Status: AC
  Filled 2021-01-11: qty 100

## 2021-01-11 MED ORDER — ACETAMINOPHEN 325 MG PO TABS
325.0000 mg | ORAL_TABLET | ORAL | Status: DC | PRN
  Administered 2021-01-11 – 2021-01-12 (×3): 650 mg via ORAL
  Filled 2021-01-11 (×3): qty 2

## 2021-01-11 MED ORDER — SODIUM CHLORIDE 0.9 % IV SOLN
INTRAVENOUS | Status: AC
  Filled 2021-01-11: qty 2

## 2021-01-11 MED ORDER — LIDOCAINE HCL (PF) 1 % IJ SOLN
INTRAMUSCULAR | Status: DC | PRN
  Administered 2021-01-11: 60 mL

## 2021-01-11 MED ORDER — HEPARIN (PORCINE) IN NACL 1000-0.9 UT/500ML-% IV SOLN
INTRAVENOUS | Status: DC | PRN
  Administered 2021-01-11: 500 mL

## 2021-01-11 MED ORDER — MIDAZOLAM HCL 5 MG/5ML IJ SOLN
INTRAMUSCULAR | Status: DC | PRN
  Administered 2021-01-11: 2 mg via INTRAVENOUS

## 2021-01-11 MED ORDER — MIDAZOLAM HCL 5 MG/5ML IJ SOLN
INTRAMUSCULAR | Status: AC
  Filled 2021-01-11: qty 5

## 2021-01-11 SURGICAL SUPPLY — 7 items
CABLE SURGICAL S-101-97-12 (CABLE) ×2 IMPLANT
LEAD TENDRIL MRI 52CM LPA1200M (Lead) ×2 IMPLANT
LEAD TENDRIL MRI 58CM LPA1200M (Lead) ×2 IMPLANT
PACEMAKER ASSURITY DR-RF (Pacemaker) ×2 IMPLANT
PAD PRO RADIOLUCENT 2001M-C (PAD) ×2 IMPLANT
SHEATH 8FR PRELUDE SNAP 13 (SHEATH) ×4 IMPLANT
TRAY PACEMAKER INSERTION (PACKS) ×2 IMPLANT

## 2021-01-11 NOTE — Progress Notes (Addendum)
Electrophysiology Rounding Note  Patient Name: [name] Date of Encounter: 01/11/2021  Primary Cardiologist: No primary care provider on file. Electrophysiologist: New   Subjective   The patient is doing well today.  At this time, the patient denies chest pain, shortness of breath, or any new concerns.  Inpatient Medications    Scheduled Meds:  chlorhexidine  60 mL Topical Once   gentamicin irrigation  80 mg Irrigation On Call   levothyroxine  75 mcg Oral QAC breakfast   mirtazapine  15 mg Oral QHS   pantoprazole  40 mg Oral Daily   pregabalin  75 mg Oral BID   sodium chloride flush  3 mL Intravenous Q12H   Continuous Infusions:  sodium chloride     sodium chloride      ceFAZolin (ANCEF) IV     PRN Meds: sodium chloride, acetaminophen, atropine, ondansetron (ZOFRAN) IV, polyvinyl alcohol, sodium chloride, sodium chloride flush, traMADol   Vital Signs    Vitals:   01/11/21 0038 01/11/21 0452 01/11/21 0500 01/11/21 0747  BP: (!) 123/54 124/71  118/75  Pulse: 68 70  68  Resp: (!) 21 18  16   Temp: 97.8 F (36.6 C) 97.8 F (36.6 C)  97.8 F (36.6 C)  TempSrc: Oral Oral  Oral  SpO2: 96% 96%  99%  Weight: 92.4 kg  92.4 kg   Height:        Intake/Output Summary (Last 24 hours) at 01/11/2021 0852 Last data filed at 01/11/2021 0502 Gross per 24 hour  Intake 660 ml  Output 800 ml  Net -140 ml   Filed Weights   01/10/21 0237 01/11/21 0038 01/11/21 0500  Weight: 92.8 kg 92.4 kg 92.4 kg    Physical Exam    GEN- The patient is well appearing, alert and oriented x 3 today.   Head- normocephalic, atraumatic Eyes-  Sclera clear, conjunctiva pink Ears- hearing intact Oropharynx- clear Neck- supple Lungs- Clear to ausculation bilaterally, normal work of breathing Heart- Regular rate and rhythm, no murmurs, rubs or gallops GI- soft, NT, ND, + BS Extremities- no clubbing or cyanosis. No edema Skin- no rash or lesion Psych- euthymic mood, full affect Neuro-  strength and sensation are intact  Labs    CBC Recent Labs    01/09/21 1420  WBC 4.7  HGB 12.6  HCT 38.5  MCV 85.0  PLT 650   Basic Metabolic Panel Recent Labs    01/09/21 1420 01/10/21 0232 01/11/21 0352  NA 140 140 139  K 3.9 3.9 3.6  CL 108 106 108  CO2 26 28 27   GLUCOSE 87 104* 107*  BUN 13 11 13   CREATININE 0.82 0.76 0.83  CALCIUM 9.0 8.9 8.8*  MG 2.0  --   --   PHOS 3.1  --   --    Liver Function Tests No results for input(s): AST, ALT, ALKPHOS, BILITOT, PROT, ALBUMIN in the last 72 hours. No results for input(s): LIPASE, AMYLASE in the last 72 hours. Cardiac Enzymes No results for input(s): CKTOTAL, CKMB, CKMBINDEX, TROPONINI in the last 72 hours.   Telemetry    NSR 70s, occasional dropped beat (personally reviewed)  Radiology    DG Chest Port 1 View  Result Date: 01/09/2021 CLINICAL DATA:  Palpitation, near syncopal episode 2 days ago. EXAM: PORTABLE CHEST 1 VIEW COMPARISON:  Chest radiograph dated 12/24/2020. FINDINGS: The heart size. Vascular calcifications are seen in the aortic arch. The left hemidiaphragm is elevated relative to the right, unchanged. Both lungs  are clear. Degenerative changes are seen in the spine. IMPRESSION: No active disease. Electronically Signed   By: Zerita Boers M.D.   On: 01/09/2021 16:41   ECHOCARDIOGRAM COMPLETE  Result Date: 01/10/2021    ECHOCARDIOGRAM REPORT   Patient Name:   [name] Date of Exam: 01/10/2021 Medical Rec #:  [id number]      Height:       67.0 in Accession #:    [id number]     Weight:       204.6 lb Date of Birth:  1927-02-13      BSA:          2.042 m Patient Age:    87 years       BP:           116/65 mmHg Patient Gender: F              HR:           79 bpm. Exam Location:  Inpatient Procedure: 2D Echo, Color Doppler, Cardiac Doppler and Intracardiac            Opacification Agent Indications:    Other abnormalities of the heart  History:        Patient has no prior history of Echocardiogram  examinations.                 Risk Factors:Hypertension.  Sonographer:    Clayton Lefort RDCS (AE) Referring Phys: [id number] [place]  Sonographer Comments: Technically difficult study due to poor echo windows, suboptimal subcostal window, suboptimal parasternal window and suboptimal apical window. IMPRESSIONS  1. Left ventricular ejection fraction, by estimation, is 65 to 70%. The left ventricle has normal function. The left ventricle has no regional wall motion abnormalities. Left ventricular diastolic parameters are consistent with Grade I diastolic dysfunction (impaired relaxation). Elevated left atrial pressure.  2. Right ventricular systolic function is normal. The right ventricular size is normal. There is normal pulmonary artery systolic pressure.  3. Left atrial size was mildly dilated.  4. The mitral valve is normal in structure. No evidence of mitral valve regurgitation. No evidence of mitral stenosis. Moderate mitral annular calcification.  5. The aortic valve is normal in structure. Aortic valve regurgitation is not visualized. Moderate aortic valve stenosis. Aortic valve mean gradient measures 6.0 mmHg.  6. The inferior vena cava is normal in size with greater than 50% respiratory variability, suggesting right atrial pressure of 3 mmHg. FINDINGS  Left Ventricle: Left ventricular ejection fraction, by estimation, is 65 to 70%. The left ventricle has normal function. The left ventricle has no regional wall motion abnormalities. Definity contrast agent was given IV to delineate the left ventricular  endocardial borders. The left ventricular internal cavity size was normal in size. There is no left ventricular hypertrophy. Left ventricular diastolic parameters are consistent with Grade I diastolic dysfunction (impaired relaxation). Elevated left atrial pressure. Right Ventricle: The right ventricular size is normal. No increase in right ventricular wall thickness. Right ventricular systolic  function is normal. There is normal pulmonary artery systolic pressure. Left Atrium: Left atrial size was mildly dilated. Right Atrium: Right atrial size was normal in size. Pericardium: There is no evidence of pericardial effusion. Mitral Valve: The mitral valve is normal in structure. There is mild thickening of the mitral valve leaflet(s). There is mild calcification of the mitral valve leaflet(s). Moderate mitral annular calcification. No evidence of mitral valve regurgitation. No evidence of mitral valve stenosis. MV peak gradient, 14.1 mmHg. The  mean mitral valve gradient is 5.5 mmHg. Tricuspid Valve: The tricuspid valve is normal in structure. Tricuspid valve regurgitation is not demonstrated. No evidence of tricuspid stenosis. Aortic Valve: The aortic valve is normal in structure. Aortic valve regurgitation is not visualized. Moderate aortic stenosis is present. Aortic valve mean gradient measures 6.0 mmHg. Aortic valve peak gradient measures 4.8 mmHg. Aortic valve area, by VTI measures 2.31 cm. Pulmonic Valve: The pulmonic valve was normal in structure. Pulmonic valve regurgitation is not visualized. No evidence of pulmonic stenosis. Aorta: The aortic root is normal in size and structure. Venous: The inferior vena cava is normal in size with greater than 50% respiratory variability, suggesting right atrial pressure of 3 mmHg. IAS/Shunts: No atrial level shunt detected by color flow Doppler.  LEFT VENTRICLE PLAX 2D LVIDd:         3.40 cm  Diastology LVIDs:         2.30 cm  LV e' medial:    4.62 cm/s LV PW:         1.20 cm  LV E/e' medial:  31.2 LV IVS:        1.40 cm  LV e' lateral:   4.05 cm/s LVOT diam:     2.00 cm  LV E/e' lateral: 35.6 LV SV:         50 LV SV Index:   24 LVOT Area:     3.14 cm  RIGHT VENTRICLE RV Basal diam:  2.60 cm RV S prime:     10.60 cm/s TAPSE (M-mode): 1.9 cm LEFT ATRIUM           Index       RIGHT ATRIUM           Index LA diam:      3.60 cm 1.76 cm/m  RA Area:     13.40 cm  LA Vol (A2C): 49.1 ml 24.05 ml/m RA Volume:   28.90 ml  14.15 ml/m LA Vol (A4C): 60.6 ml 29.68 ml/m  AORTIC VALVE AV Area (Vmax):    2.37 cm AV Area (Vmean):   2.33 cm AV Area (VTI):     2.31 cm AV Vmax:           109.00 cm/s AV Vmean:          72.400 cm/s AV VTI:            0.216 m AV Peak Grad:      4.8 mmHg AV Mean Grad:      6.0 mmHg LVOT Vmax:         82.20 cm/s LVOT Vmean:        53.700 cm/s LVOT VTI:          0.159 m LVOT/AV VTI ratio: 0.74  AORTA Ao Root diam: 2.70 cm MITRAL VALVE MV Area (PHT): 2.12 cm     SHUNTS MV Area VTI:   1.05 cm     Systemic VTI:  0.16 m MV Peak grad:  14.1 mmHg    Systemic Diam: 2.00 cm MV Mean grad:  5.5 mmHg MV Vmax:       1.88 m/s MV Vmean:      109.5 cm/s MV Decel Time: 357 msec MV E velocity: 144.00 cm/s MV A velocity: 202.00 cm/s MV E/A ratio:  0.71 Ena Dawley MD Electronically signed by Ena Dawley MD Signature Date/Time: 01/10/2021/12:36:40 PM    Final     Patient Profile     Edit Ricciardelli is a 87 y.o. female with a  hx of HTN who is being seen today for the evaluation of near syncope and symptomatic mobitz 2, second degree AV block at the request of Dr. Neena Rhymes.   Assessment & Plan    High-grade heart block  - Symptomatic 2-1 heart block. Explained risks, benefits, and alternatives to PPM implantation, including but not limited to bleeding, infection, pneumothorax, pericardial effusion, lead dislodgement, heart attack, stroke, or death.  Pt verbalized understanding and agrees to proceed.  Hypertension - Resume BB post PPM  History of pulmonary emboli  - Eliquis on hold for PPM. Resumption timing pending course.   Pt is on for PPM today, MD pending availability (Dr. Quentin Ore or Dr. Lovena Le)  For questions or updates, please contact [facility] Please consult [email or web address] for contact info under Cardiology/STEMI.  Signed, Shirley Friar, PA-C  01/11/2021, 8:52 AM   EP Attending  Patient seen and examined. Agree with  above. The patient presents with high grade heart block. I have recommended proceeding with PPM insertion.   Carleene Overlie Oris Staffieri,MD

## 2021-01-11 NOTE — Discharge Summary (Addendum)
ELECTROPHYSIOLOGY PROCEDURE DISCHARGE SUMMARY    Patient ID: [name],  MRN: [id number], DOB/AGE: July 21, 1927 87 y.o.  Admit date: 01/09/2021 Discharge date: 01/12/21   Primary Care Physician: [name], DO  Primary Cardiologist: No primary care provider on file.  Electrophysiologist: New to Dr. Lovena Le  Primary Discharge Diagnosis:  Symptomatic bradycardia due to High grade Heart block status post pacemaker implantation this admission  Secondary Discharge Diagnosis:  HTN H/o Pulmonary embolism  Allergies  Allergen Reactions   Codeine Other (See Comments)    Caused vertigo to the point of passing out     Procedures This Admission:  1.  Implantation of a St. Jude dual chamber PPM on 01/11/21 by Dr. Lovena Le. The patient received a St. Jude model number [id number] PPM with model number [id number] right atrial lead and [id number] right ventricular lead. There were no immediate post procedure complications. 2.  CXR on 01/12/21 demonstrated no pneumothorax status post device implantation.   Brief HPI: [name] is a 87 y.o. female was admitted for near syncope and electrophysiology team asked to see for consideration of PPM implantation.  Past medical history includes above.  The patient has had symptomatic bradycardia without reversible causes identified.  Risks, benefits, and alternatives to PPM implantation were reviewed with the patient who wished to proceed.   Hospital Course:  The patient was admitted and underwent implantation of a St. Jude dual chamber PPM with details as outlined above.  She was monitored on telemetry overnight which demonstrated NSR 70s.  Left chest was without hematoma or ecchymosis.  The device was interrogated and found to be functioning normally.  CXR was obtained and demonstrated no pneumothorax status post device implantation.  Wound care, arm mobility, and restrictions were reviewed with the patient.  The patient was examined and considered  stable for discharge to home.    Regarding blood thinner therapy, they were instructed to resume their Eliquis on Saturday, 01/16/21.   Pt complained of left LE pain am of 3/22. Some chronic component as she had complained about LE foot pain earlier in the admission.   Stat DVT US negative for thrombosis. Suspect musculoskeletal in setting of being in bed and chronic compression fracture(s). Of note, she had not received her chronic hydrocodone, which will be resumed for discharge.    Physical Exam: Vitals:   01/11/21 2000 01/12/21 0442 01/12/21 0736 01/12/21 1113  BP: 92/70 110/77 128/62 120/64  Pulse: 74 78 72 73  Resp: 17 18 18 18   Temp: 98.1 F (36.7 C) (!) 97.4 F (36.3 C) 98.2 F (36.8 C) 97.8 F (36.6 C)  TempSrc: Oral Oral Oral Oral  SpO2: 97% 97% 97% 97%  Weight:  92.1 kg    Height:        GEN- The patient is well appearing, alert and oriented x 3 today.   HEENT: normocephalic, atraumatic; sclera clear, conjunctiva pink; hearing intact; oropharynx clear; neck supple, no JVP Lymph- no cervical lymphadenopathy Lungs- Clear to ausculation bilaterally, normal work of breathing.  No wheezes, rales, rhonchi Heart- Regular rate and rhythm, no murmurs, rubs or gallops, PMI not laterally displaced GI- soft, non-tender, non-distended, bowel sounds present, no hepatosplenomegaly Extremities- no clubbing, cyanosis, or edema; DP/PT/radial pulses 2+ bilaterally MS- no significant deformity or atrophy Skin- warm and dry, no rash or lesion, left chest without hematoma/ecchymosis Psych- euthymic mood, full affect Neuro- strength and sensation are intact   Labs:   Lab Results  Component Value Date   WBC 4.7  01/09/2021   HGB 12.6 01/09/2021   HCT 38.5 01/09/2021   MCV 85.0 01/09/2021   PLT 172 01/09/2021    Recent Labs  Lab 01/12/21 0401  NA 139  K 4.0  CL 105  CO2 30  BUN 14  CREATININE 0.82  CALCIUM 9.2  GLUCOSE 102*    Discharge Medications:  Allergies as of  01/12/2021       Reactions   Codeine Other (See Comments)   Caused vertigo to the point of passing out        Medication List     TAKE these medications    acetaminophen 500 MG tablet Commonly known as: TYLENOL Take 2 tablets (1,000 mg total) by mouth every 8 (eight) hours. What changed:  when to take this reasons to take this   docusate sodium 100 MG capsule Commonly known as: COLACE Take 1 capsule (100 mg total) by mouth 2 (two) times daily.   Eliquis 5 MG Tabs tablet Generic drug: apixaban Take 1 tablet (5 mg total) by mouth 2 (two) times daily. Start taking on: January 16, 2021 What changed: These instructions start on January 16, 2021. If you are unsure what to do until then, ask your doctor or other care provider.   furosemide 20 MG tablet Commonly known as: LASIX Take 20 mg by mouth daily as needed for fluid or edema.   hydrochlorothiazide 12.5 MG capsule Commonly known as: Microzide Take 1 capsule (12.5 mg total) by mouth daily.   HYDROcodone-acetaminophen 5-325 MG tablet Commonly known as: NORCO/VICODIN Take 0.5-1 tablets by mouth every 6 (six) hours as needed for moderate pain.   levothyroxine 75 MCG tablet Commonly known as: SYNTHROID Take 75 mcg by mouth daily before breakfast.   magnesium citrate Soln Take 296 mLs (1 Bottle total) by mouth daily as needed for severe constipation.   magnesium hydroxide 400 MG/5ML suspension Commonly known as: MILK OF MAGNESIA Take 15-30 mLs by mouth daily as needed for mild constipation.   meclizine 25 MG tablet Commonly known as: ANTIVERT Take 25 mg by mouth 3 (three) times daily as needed for dizziness.   mirtazapine 15 MG tablet Commonly known as: REMERON Take 15 mg by mouth at bedtime.   NON FORMULARY Take 30-60 mLs by mouth See admin instructions. Prune juice- Drink 30-60 ml's by mouth as needed for constipation   omeprazole 20 MG capsule Commonly known as: PRILOSEC Take 20 mg by mouth daily after  lunch.   polyethylene glycol 17 g packet Commonly known as: MIRALAX / GLYCOLAX Take 17 g by mouth 2 (two) times daily as needed for mild constipation.   polyvinyl alcohol 1.4 % ophthalmic solution Commonly known as: LIQUIFILM TEARS Place 1 drop into both eyes as needed for dry eyes.   pregabalin 75 MG capsule Commonly known as: LYRICA Take 1 capsule (75 mg total) by mouth 2 (two) times daily.   senna-docusate 8.6-50 MG tablet Commonly known as: Senokot-S Take 2 tablets by mouth 2 (two) times daily as needed for moderate constipation.   sodium chloride 0.65 % Soln nasal spray Commonly known as: OCEAN Place 1 spray into both nostrils as needed for congestion.   traMADol 50 MG tablet Commonly known as: ULTRAM Take 50 mg by mouth every 6 (six) hours as needed for moderate pain or severe pain.   Vitamin D (Ergocalciferol) 1.25 MG (50000 UNIT) Caps capsule Commonly known as: DRISDOL Take 1 capsule (50,000 Units total) by mouth every 7 (seven) days.  Disposition:     Follow-up Information     Shirley Friar, PA-C Follow up.   Specialty: Physician Assistant Why: on 3/31 at 1235 for post pacemaker wound check Contact information: [place] [address] [phone]                 Duration of Discharge Encounter: Greater than 30 minutes including physician time.  Jacalyn Lefevre, PA-C  01/12/2021 11:43 AM  EP Attending  Patient seen and examined. Agree with the findings as noted by Mr. Tillery. She has left leg pain and ultrasound demonstrates no clot. Her PM interrogation under my direction demonstrates normal DDD PM function on the iCD. Her CXR demonstrates no PTX. She is stable for DC home with usual followup.   Carleene Overlie Jamason Peckham,MD

## 2021-01-11 NOTE — Discharge Instructions (Signed)
After Your Pacemaker   . You have a St. Jude Pacemaker  ACTIVITY . Do not lift your arm above shoulder height for 1 week after your procedure. After 7 days, you may progress as below.  . You should remove your sling 24 hours after your procedure, unless otherwise instructed by your provider.     Monday January 18, 2021  Tuesday January 19, 2021 Wednesday January 20, 2021 Thursday January 21, 2021   . Do not lift, push, pull, or carry anything over 10 pounds with the affected arm until 6 weeks (Monday Feb 22, 2021 ) after your procedure.   . Do NOT DRIVE until you have been seen for your wound check, or as long as instructed by your healthcare provider.   . Ask your healthcare provider when you can go back to work   INCISION/Dressing . If you are on a blood thinner such as Coumadin, Xarelto, Eliquis, Plavix, or Pradaxa please confirm with your provider when this should be resumed.   . If large square, outer bandage is left in place, this can be removed after 24 hours from your procedure. Do not remove steri-strips or glue as below.   . Monitor your Pacemaker site for redness, swelling, and drainage. Call the device clinic at [phone] if you experience these symptoms or fever/chills.  . If your incision is sealed with Steri-strips or staples, you may shower 10 days after your procedure or when told by your provider. Do not remove the steri-strips or let the shower hit directly on your site. You may wash around your site with soap and water.    Marland Kitchen Avoid lotions, ointments, or perfumes over your incision until it is well-healed.  . You may use a hot tub or a pool AFTER your wound check appointment if the incision is completely closed.  Marland Kitchen PAcemaker Alerts:  Some alerts are vibratory and others beep. These are NOT emergencies. Please call our office to let us know. If this occurs at night or on weekends, it can wait until the next business day. Send a remote transmission.  . If your device is  capable of reading fluid status (for heart failure), you will be offered monthly monitoring to review this with you.   DEVICE MANAGEMENT . Remote monitoring is used to monitor your pacemaker from home. This monitoring is scheduled every 91 days by our office. It allows [place] to keep an eye on the functioning of your device to ensure it is working properly. You will routinely see your Electrophysiologist annually (more often if necessary).   . You should receive your ID card for your new device in 4-8 weeks. Keep this card with you at all times once received. Consider wearing a medical alert bracelet or necklace.  . Your Pacemaker may be MRI compatible. This will be discussed at your next office visit/wound check.  You should avoid contact with strong electric or magnetic fields.    Do not use amateur (ham) radio equipment or electric (arc) welding torches. MP3 player headphones with magnets should not be used. Some devices are safe to use if held at least 12 inches (30 cm) from your Pacemaker. These include power tools, lawn mowers, and speakers. If you are unsure if something is safe to use, ask your health care provider.   When using your cell phone, hold it to the ear that is on the opposite side from the Pacemaker. Do not leave your cell phone in a pocket over the Pacemaker.  You may safely use electric blankets, heating pads, computers, and microwave ovens.  Call the office right away if:  You have chest pain.  You feel more short of breath than you have felt before.  You feel more light-headed than you have felt before.  Your incision starts to open up.  This information is not intended to replace advice given to you by your health care provider. Make sure you discuss any questions you have with your health care provider.

## 2021-01-12 ENCOUNTER — Inpatient Hospital Stay (HOSPITAL_COMMUNITY): Payer: Medicare Other

## 2021-01-12 DIAGNOSIS — M79606 Pain in leg, unspecified: Secondary | ICD-10-CM

## 2021-01-12 LAB — BASIC METABOLIC PANEL
Anion gap: 4 — ABNORMAL LOW (ref 5–15)
BUN: 14 mg/dL (ref 8–23)
CO2: 30 mmol/L (ref 22–32)
Calcium: 9.2 mg/dL (ref 8.9–10.3)
Chloride: 105 mmol/L (ref 98–111)
Creatinine, Ser: 0.82 mg/dL (ref 0.44–1.00)
GFR, Estimated: >60 mL/min (ref >60)
Glucose, Bld: 102 mg/dL — ABNORMAL HIGH (ref 70–99)
Potassium: 4 mmol/L (ref 3.5–5.1)
Sodium: 139 mmol/L (ref 135–145)

## 2021-01-12 MED ORDER — ELIQUIS 5 MG PO TABS: 5.0000 mg | ORAL_TABLET | Freq: Two times a day (BID) | ORAL | Status: DC

## 2021-01-12 MED FILL — Lidocaine HCl Local Inj 1%: INTRAMUSCULAR | Qty: 60 | Status: AC

## 2021-01-12 NOTE — TOC Transition Note (Signed)
Transition of Care [facility]) - CM/SW Discharge Note   Patient Details  Name: [name] MRN: [id number] Date of Birth: 26-May-1927  Transition of [facility]) CM/SW Contact:  Zenon Mayo, RN Phone Number: 01/12/2021, 11:55 AM   Clinical Narrative:    Patient is for dc today, she states she has a bp cuff at home. She has no issues getting her medications.  Her daughter takes her to [facility].  She has transportation home. She states to call her daughter Olegario Shearer about [facility] services.  Olegario Shearer would like [facility] for CHF Management.  She does not have a preference of the agency.  NCM made referral to [facility] with Alvis Lemmings for [facility] for CHF manangement, he is able to take referral.  Soc will begin 24 to 48 hrs post dc.    Final next level of care: [place] Barriers to Discharge: No Barriers Identified   Patient Goals and CMS Choice Patient states their goals for this hospitalization and ongoing recovery are:: home with [facility] CMS Medicare.gov Compare Post Acute Care list provided to:: Patient Represenative (must comment) Choice offered to / list presented to : Adult Children  Discharge Placement                       Discharge Plan and Services                DME Arranged: N/A DME Agency: NA       HH Arranged: RN,Disease Management HH Agency: [place] Date [facility] Agency Contacted: 01/12/21 Time [place]: [id number] Representative spoke with at [place]: [place] ([place]) Interventions     Readmission Risk Interventions No flowsheet data found.

## 2021-01-12 NOTE — Plan of Care (Signed)
  Problem: Education: Goal: Knowledge of General Education information will improve Description: Including pain rating scale, medication(s)/side effects and non-pharmacologic comfort measures Outcome: Adequate for Discharge   Problem: Health Behavior/Discharge Planning: Goal: Ability to manage health-related needs will improve Outcome: Adequate for Discharge   Problem: Clinical Measurements: Goal: Diagnostic test results will improve Outcome: Adequate for Discharge Goal: Respiratory complications will improve Outcome: Adequate for Discharge   Problem: Activity: Goal: Risk for activity intolerance will decrease Outcome: Adequate for Discharge

## 2021-01-12 NOTE — Progress Notes (Signed)
  Pt feeling OK this am, but complaining of left foot and leg pain.  She states it is similar to when she had blood clots in the past.   Device check and CXR stable.   Will get stat DVT US. If negative, Ok for home today with usual follow up.   Will need to continue to hold Eliquis until 01/16/21 if no DVT noted as she is s/p PPM.   Full note pending disposition.   Legrand Como [address]" [place], PA-C  01/12/2021 8:12 AM

## 2021-01-12 NOTE — Progress Notes (Signed)
D/C instructions given and reviewed. Tele and IV removed, tolerated well. Family at bedside to transport home. 

## 2021-01-12 NOTE — Progress Notes (Signed)
Bilateral lower extremity venous study completed.      Please see CV Proc for preliminary results.   Azaylea Maves, RVT  

## 2021-01-14 ENCOUNTER — Telehealth: Payer: Self-pay | Admitting: Internal Medicine

## 2021-01-14 DIAGNOSIS — H3411 Central retinal artery occlusion, right eye: Secondary | ICD-10-CM | POA: Diagnosis not present

## 2021-01-14 DIAGNOSIS — Z7901 Long term (current) use of anticoagulants: Secondary | ICD-10-CM | POA: Diagnosis not present

## 2021-01-14 DIAGNOSIS — G629 Polyneuropathy, unspecified: Secondary | ICD-10-CM | POA: Diagnosis not present

## 2021-01-14 DIAGNOSIS — Z86711 Personal history of pulmonary embolism: Secondary | ICD-10-CM | POA: Diagnosis not present

## 2021-01-14 DIAGNOSIS — Z9181 History of falling: Secondary | ICD-10-CM | POA: Diagnosis not present

## 2021-01-14 DIAGNOSIS — Z96651 Presence of right artificial knee joint: Secondary | ICD-10-CM | POA: Diagnosis not present

## 2021-01-14 DIAGNOSIS — I441 Atrioventricular block, second degree: Secondary | ICD-10-CM | POA: Diagnosis not present

## 2021-01-14 DIAGNOSIS — Z950 Presence of cardiac pacemaker: Secondary | ICD-10-CM | POA: Diagnosis not present

## 2021-01-14 DIAGNOSIS — I10 Essential (primary) hypertension: Secondary | ICD-10-CM | POA: Diagnosis not present

## 2021-01-14 DIAGNOSIS — M479 Spondylosis, unspecified: Secondary | ICD-10-CM | POA: Diagnosis not present

## 2021-01-14 DIAGNOSIS — Z48812 Encounter for surgical aftercare following surgery on the circulatory system: Secondary | ICD-10-CM | POA: Diagnosis not present

## 2021-01-14 DIAGNOSIS — Z86718 Personal history of other venous thrombosis and embolism: Secondary | ICD-10-CM | POA: Diagnosis not present

## 2021-01-14 DIAGNOSIS — E890 Postprocedural hypothyroidism: Secondary | ICD-10-CM | POA: Diagnosis not present

## 2021-01-14 DIAGNOSIS — H5461 Unqualified visual loss, right eye, normal vision left eye: Secondary | ICD-10-CM | POA: Diagnosis not present

## 2021-01-14 NOTE — Telephone Encounter (Signed)
Pt c/o medication issue:  1. Name of Medication:  furosemide (LASIX) 20 MG tablet hydrochlorothiazide (MICROZIDE) 12.5 MG capsule  2. How are you currently taking this medication (dosage and times per day)? Needs clarification  3. Are you having a reaction (difficulty breathing--STAT)? no  4. What is your medication issue? Diane from [place] calling to clarify how the patient is to take the medication. She states the hospital instructions says to take lasix as need and hydrochorothiazide daily. She states she also needs home health orders for visits. Phone: [phone]

## 2021-01-14 NOTE — Telephone Encounter (Signed)
RN spoke to Diane, who states she thought she was calling the PCP office and she was not sure who was in charge of what. RN confirmed pcp information, and Diane was gong to call them now.

## 2021-01-21 ENCOUNTER — Other Ambulatory Visit: Payer: Self-pay

## 2021-01-21 ENCOUNTER — Ambulatory Visit (INDEPENDENT_AMBULATORY_CARE_PROVIDER_SITE_OTHER): Payer: Medicare Other | Admitting: Student

## 2021-01-21 DIAGNOSIS — I4430 Unspecified atrioventricular block: Secondary | ICD-10-CM

## 2021-01-21 LAB — CUP PACEART INCLINIC DEVICE CHECK
Battery Remaining Longevity: 115 mo
Battery Voltage: 3.13 V
Brady Statistic RA Percent Paced: 5.1 %
Brady Statistic RV Percent Paced: 28 %
Date Time Interrogation Session: 20220331130024
Implantable Lead Implant Date: 20220321
Implantable Lead Implant Date: 20220321
Implantable Lead Location: 753859
Implantable Lead Location: 753860
Implantable Pulse Generator Implant Date: 20220321
Lead Channel Impedance Value: 537.5 Ohm
Lead Channel Impedance Value: 587.5 Ohm
Lead Channel Pacing Threshold Amplitude: 0.75 V
Lead Channel Pacing Threshold Amplitude: 0.75 V
Lead Channel Pacing Threshold Amplitude: 0.75 V
Lead Channel Pacing Threshold Amplitude: 0.75 V
Lead Channel Pacing Threshold Pulse Width: 0.4 ms
Lead Channel Pacing Threshold Pulse Width: 0.4 ms
Lead Channel Pacing Threshold Pulse Width: 0.4 ms
Lead Channel Pacing Threshold Pulse Width: 0.4 ms
Lead Channel Sensing Intrinsic Amplitude: 10.1 mV
Lead Channel Sensing Intrinsic Amplitude: 2.6 mV
Lead Channel Setting Pacing Amplitude: 3.5 V
Lead Channel Setting Pacing Amplitude: 3.5 V
Lead Channel Setting Pacing Pulse Width: 0.4 ms
Lead Channel Setting Sensing Sensitivity: 2 mV
Pulse Gen Model: 2272
Pulse Gen Serial Number: 3910619

## 2021-01-21 NOTE — Progress Notes (Signed)
Wound check appointment. Steri-strips removed. Wound without redness or edema. Incision edges approximated, wound well healed. Normal device function. Thresholds, sensing, and impedances consistent with implant measurements. AMS appear AT. No true AF. No HVR. Pt also on Eliquis for h/o DVT. Programmed at 3.5V/auto capture programmed on for extra safety margin until 3 month visit. Histogram distribution appropriate for patient and level of activity. Patient educated about wound care, arm mobility, lifting restrictions. ROV in 3 months with Dr. Lovena Le.

## 2021-01-25 DIAGNOSIS — H3411 Central retinal artery occlusion, right eye: Secondary | ICD-10-CM | POA: Diagnosis not present

## 2021-01-25 DIAGNOSIS — I441 Atrioventricular block, second degree: Secondary | ICD-10-CM | POA: Diagnosis not present

## 2021-01-25 DIAGNOSIS — I10 Essential (primary) hypertension: Secondary | ICD-10-CM | POA: Diagnosis not present

## 2021-01-27 DIAGNOSIS — I4430 Unspecified atrioventricular block: Secondary | ICD-10-CM | POA: Diagnosis not present

## 2021-01-27 DIAGNOSIS — Z7901 Long term (current) use of anticoagulants: Secondary | ICD-10-CM | POA: Diagnosis not present

## 2021-01-27 DIAGNOSIS — Z09 Encounter for follow-up examination after completed treatment for conditions other than malignant neoplasm: Secondary | ICD-10-CM | POA: Diagnosis not present

## 2021-01-27 DIAGNOSIS — Z950 Presence of cardiac pacemaker: Secondary | ICD-10-CM | POA: Diagnosis not present

## 2021-02-02 DIAGNOSIS — R0602 Shortness of breath: Secondary | ICD-10-CM | POA: Diagnosis not present

## 2021-02-02 DIAGNOSIS — Z950 Presence of cardiac pacemaker: Secondary | ICD-10-CM | POA: Diagnosis not present

## 2021-02-02 DIAGNOSIS — Z7901 Long term (current) use of anticoagulants: Secondary | ICD-10-CM | POA: Diagnosis not present

## 2021-02-02 DIAGNOSIS — I519 Heart disease, unspecified: Secondary | ICD-10-CM | POA: Diagnosis not present

## 2021-02-02 DIAGNOSIS — M7989 Other specified soft tissue disorders: Secondary | ICD-10-CM | POA: Diagnosis not present

## 2021-02-02 DIAGNOSIS — I4430 Unspecified atrioventricular block: Secondary | ICD-10-CM | POA: Diagnosis not present

## 2021-02-03 DIAGNOSIS — M7989 Other specified soft tissue disorders: Secondary | ICD-10-CM | POA: Diagnosis not present

## 2021-02-03 DIAGNOSIS — R0602 Shortness of breath: Secondary | ICD-10-CM | POA: Diagnosis not present

## 2021-02-04 DIAGNOSIS — R531 Weakness: Secondary | ICD-10-CM | POA: Diagnosis not present

## 2021-02-04 DIAGNOSIS — G629 Polyneuropathy, unspecified: Secondary | ICD-10-CM | POA: Diagnosis not present

## 2021-02-08 DIAGNOSIS — R030 Elevated blood-pressure reading, without diagnosis of hypertension: Secondary | ICD-10-CM | POA: Diagnosis not present

## 2021-02-08 DIAGNOSIS — S22080D Wedge compression fracture of T11-T12 vertebra, subsequent encounter for fracture with routine healing: Secondary | ICD-10-CM | POA: Diagnosis not present

## 2021-02-08 DIAGNOSIS — M545 Low back pain, unspecified: Secondary | ICD-10-CM | POA: Diagnosis not present

## 2021-02-13 ENCOUNTER — Telehealth: Payer: Self-pay | Admitting: Student

## 2021-02-13 NOTE — Telephone Encounter (Signed)
   Patient's daughter called Answering Service with concern that St. Jude Location manager was beeping. Called and spoke with daughter. Daughter reports patient developed some chest heaviness and shortness of breath. Daughter pushed the white button on the transmitter and then it started beeping and would not stop.   Patient continues to have chest heaviness and shortness of breath, although slightly improved. She does not normally have these symptoms. No known CAD. Discussed with Dr. Quentin Ore and these symptoms are not likely to be secondary to PPM implantation. I advised daughter to bring patient to the ED given persistent symptoms and she was in agreement with this.   Dr. Quentin Ore also helped put me in touch with the [facility], about the transmitter beeping. Given patient is coming to the ED, Aaron Edelman recommended patient just unplug transmitter and then they will call back and help them re-set this up at a later time. Aaron Edelman states he can also follow-up on device interrogation in the ED. Notified patient and daughter of this as well.  Darreld Mclean, PA-C 02/13/2021 5:23 PM

## 2021-02-14 ENCOUNTER — Emergency Department (HOSPITAL_COMMUNITY)
Admission: EM | Admit: 2021-02-14 | Discharge: 2021-02-14 | Disposition: A | Discharge status: Home / Self Care | Payer: Medicare Other | Attending: Emergency Medicine | Admitting: Emergency Medicine

## 2021-02-14 ENCOUNTER — Other Ambulatory Visit: Payer: Self-pay

## 2021-02-14 ENCOUNTER — Emergency Department (HOSPITAL_COMMUNITY): Payer: Medicare Other

## 2021-02-14 DIAGNOSIS — R202 Paresthesia of skin: Secondary | ICD-10-CM | POA: Diagnosis not present

## 2021-02-14 DIAGNOSIS — Z7901 Long term (current) use of anticoagulants: Secondary | ICD-10-CM | POA: Insufficient documentation

## 2021-02-14 DIAGNOSIS — R0602 Shortness of breath: Secondary | ICD-10-CM | POA: Diagnosis not present

## 2021-02-14 DIAGNOSIS — R531 Weakness: Secondary | ICD-10-CM | POA: Diagnosis not present

## 2021-02-14 DIAGNOSIS — Z79899 Other long term (current) drug therapy: Secondary | ICD-10-CM | POA: Diagnosis not present

## 2021-02-14 DIAGNOSIS — I10 Essential (primary) hypertension: Secondary | ICD-10-CM | POA: Diagnosis not present

## 2021-02-14 DIAGNOSIS — R079 Chest pain, unspecified: Secondary | ICD-10-CM | POA: Diagnosis not present

## 2021-02-14 DIAGNOSIS — M25512 Pain in left shoulder: Secondary | ICD-10-CM | POA: Insufficient documentation

## 2021-02-14 DIAGNOSIS — M79603 Pain in arm, unspecified: Secondary | ICD-10-CM | POA: Diagnosis not present

## 2021-02-14 DIAGNOSIS — Z950 Presence of cardiac pacemaker: Secondary | ICD-10-CM | POA: Insufficient documentation

## 2021-02-14 DIAGNOSIS — R0789 Other chest pain: Secondary | ICD-10-CM | POA: Diagnosis not present

## 2021-02-14 DIAGNOSIS — M549 Dorsalgia, unspecified: Secondary | ICD-10-CM | POA: Insufficient documentation

## 2021-02-14 LAB — URINALYSIS, ROUTINE W REFLEX MICROSCOPIC
Bacteria, UA: NONE SEEN
Bilirubin Urine: NEGATIVE
Glucose, UA: NEGATIVE mg/dL
Ketones, ur: NEGATIVE mg/dL
Leukocytes,Ua: NEGATIVE
Nitrite: NEGATIVE
Protein, ur: NEGATIVE mg/dL
Specific Gravity, Urine: 1.006 (ref 1.005–1.030)
pH: 9 — ABNORMAL HIGH (ref 5.0–8.0)

## 2021-02-14 LAB — COMPREHENSIVE METABOLIC PANEL
ALT: 12 U/L (ref 0–44)
AST: 19 U/L (ref 15–41)
Albumin: 3.2 g/dL — ABNORMAL LOW (ref 3.5–5.0)
Alkaline Phosphatase: 68 U/L (ref 38–126)
Anion gap: 5 (ref 5–15)
BUN: 10 mg/dL (ref 8–23)
CO2: 28 mmol/L (ref 22–32)
Calcium: 9.4 mg/dL (ref 8.9–10.3)
Chloride: 107 mmol/L (ref 98–111)
Creatinine, Ser: 0.67 mg/dL (ref 0.44–1.00)
GFR, Estimated: >60 mL/min (ref >60)
Glucose, Bld: 98 mg/dL (ref 70–99)
Potassium: 3.6 mmol/L (ref 3.5–5.1)
Sodium: 140 mmol/L (ref 135–145)
Total Bilirubin: 0.9 mg/dL (ref 0.3–1.2)
Total Protein: 6.8 g/dL (ref 6.5–8.1)

## 2021-02-14 LAB — TROPONIN I (HIGH SENSITIVITY)
Troponin I (High Sensitivity): <2 ng/L (ref <18)
Troponin I (High Sensitivity): <2 ng/L (ref <18)

## 2021-02-14 LAB — CBC WITH DIFFERENTIAL/PLATELET
Abs Immature Granulocytes: 0.01 10*3/uL (ref 0.00–0.07)
Basophils Absolute: 0 10*3/uL (ref 0.0–0.1)
Basophils Relative: 1 %
Eosinophils Absolute: 0.1 10*3/uL (ref 0.0–0.5)
Eosinophils Relative: 2 %
HCT: 38 % (ref 36.0–46.0)
Hemoglobin: 12.3 g/dL (ref 12.0–15.0)
Immature Granulocytes: 0 %
Lymphocytes Relative: 24 %
Lymphs Abs: 1 10*3/uL (ref 0.7–4.0)
MCH: 27.6 pg (ref 26.0–34.0)
MCHC: 32.4 g/dL (ref 30.0–36.0)
MCV: 85.4 fL (ref 80.0–100.0)
Monocytes Absolute: 0.4 10*3/uL (ref 0.1–1.0)
Monocytes Relative: 10 %
Neutro Abs: 2.5 10*3/uL (ref 1.7–7.7)
Neutrophils Relative %: 63 %
Platelets: 146 10*3/uL — ABNORMAL LOW (ref 150–400)
RBC: 4.45 MIL/uL (ref 3.87–5.11)
RDW: 15.2 % (ref 11.5–15.5)
WBC: 3.9 10*3/uL — ABNORMAL LOW (ref 4.0–10.5)
nRBC: 0 % (ref 0.0–0.2)

## 2021-02-14 NOTE — ED Provider Notes (Signed)
[facility] EMERGENCY DEPARTMENT Provider Note   CSN: [id number] Arrival date & time: 02/14/21  2353     History Chief Complaint  Patient presents with  . Chest Pain    [name] is a 87 y.o. 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 Pt reports that yesterday around 6 PM after eating dinner she began feeling very lightheaded and like she could pass out. She went to sleep a couple of hours later and reports around 1-2 AM she was woken up from her sleep with sharp left shoulder blade pain running down her LUE. She states the pain subsided after about 1 hour and she went back to sleep. When she woke up this morning she felt generally weak and fatigued prompting her to call her daughter who then called EMS. Pt reports she was having some mild pain in her left arm when EMS arrived and she was given 4 ASA; she denies any pain currently however continues to state she feels lightheaded and "woozy."   Daughter reports that pt's pacemaker machine was beeping yesterday. They attempted to call the company to reset it however did not have any success. There is a telephone encounter in the chart from cardiology yesterday regarding this. It appears the cardiologist advised pt to go to the ED yesterday for further evaluation.   The history is provided by the patient and medical records.       Past Medical History:  Diagnosis Date  . Central retinal artery occlusion of right eye 2015   lost complete vision  . Edema   . Hypertension    not on BP medication currently  . Neuropathy   . Post-surgical hypothyroidism   . Pulmonary emboli ([place])   . VTE (venous thromboembolism) 12/2018   LLE DVT and PE    Patient Active Problem List   Diagnosis Date Noted  . AV block 01/09/2021  . Fall at home, initial encounter 09/27/2020  . Post-surgical  hypothyroidism   . Neuropathy   . Hypertension   . VTE (venous thromboembolism) 12/2018    Past Surgical History:  Procedure Laterality Date  . PACEMAKER IMPLANT N/A 01/11/2021   Procedure: PACEMAKER IMPLANT;  Surgeon: Evans Lance, MD;  Location: [facility];  Service: Cardiovascular;  Laterality: N/A;  . REPLACEMENT TOTAL KNEE Right 2008  . TOTAL THYROIDECTOMY       OB History   No obstetric history on file.     No family history on file.  Social History   Tobacco Use  . Smoking status: Never Smoker  . Smokeless tobacco: Never Used  Substance Use Topics  . Alcohol use: Never  . Drug use: Never    Home Medications Prior to Admission medications   Medication Sig Start Date End Date Taking? Authorizing Provider  acetaminophen (TYLENOL) 500 MG tablet Take 2 tablets (1,000 mg total) by mouth every 8 (eight) hours. Patient taking differently: Take 1,000 mg by mouth every 8 (eight) hours as needed for mild pain (or discomfort). 10/01/20   Mercy Riding, MD  docusate sodium (COLACE) 100 MG capsule Take 1 capsule (100 mg total) by mouth 2 (two) times daily. Patient not taking: No sig reported 10/05/20   Mercy Riding, MD  ELIQUIS 5 MG TABS tablet Take 1 tablet (5 mg total) by mouth 2 (two) times daily. 01/16/21  Shirley Friar, PA-C  furosemide (LASIX) 20 MG tablet Take 20 mg by mouth daily as needed for fluid or edema.    [provider]  hydrochlorothiazide (MICROZIDE) 12.5 MG capsule Take 1 capsule (12.5 mg total) by mouth daily. Patient not taking: No sig reported 10/05/20 10/05/21  Mercy Riding, MD  HYDROcodone-acetaminophen (NORCO/VICODIN) 5-325 MG tablet Take 0.5-1 tablets by mouth every 6 (six) hours as needed for moderate pain.    [provider]  levothyroxine (SYNTHROID) 75 MCG tablet Take 75 mcg by mouth daily before breakfast.    [provider]  magnesium citrate SOLN Take 296 mLs (1 Bottle total) by mouth daily as needed  for severe constipation. Patient not taking: No sig reported 10/05/20   Mercy Riding, MD  magnesium hydroxide (MILK OF MAGNESIA) 400 MG/5ML suspension Take 15-30 mLs by mouth daily as needed for mild constipation.    [provider]  meclizine (ANTIVERT) 25 MG tablet Take 25 mg by mouth 3 (three) times daily as needed for dizziness.    [provider]  mirtazapine (REMERON) 15 MG tablet Take 15 mg by mouth at bedtime.    [provider]  NON FORMULARY Take 30-60 mLs by mouth See admin instructions. Prune juice- Drink 30-60 ml's by mouth as needed for constipation    [provider]  omeprazole (PRILOSEC) 20 MG capsule Take 20 mg by mouth daily after lunch.    [provider]  polyethylene glycol (MIRALAX / GLYCOLAX) 17 g packet Take 17 g by mouth 2 (two) times daily as needed for mild constipation. Patient not taking: No sig reported 10/05/20   Mercy Riding, MD  polyvinyl alcohol (LIQUIFILM TEARS) 1.4 % ophthalmic solution Place 1 drop into both eyes as needed for dry eyes. 10/05/20   Mercy Riding, MD  pregabalin (LYRICA) 75 MG capsule Take 1 capsule (75 mg total) by mouth 2 (two) times daily. 10/05/20   Mercy Riding, MD  senna-docusate (SENOKOT-S) 8.6-50 MG tablet Take 2 tablets by mouth 2 (two) times daily as needed for moderate constipation. Patient not taking: No sig reported 10/01/20   Mercy Riding, MD  sodium chloride (OCEAN) 0.65 % SOLN nasal spray Place 1 spray into both nostrils as needed for congestion.    [provider]  traMADol (ULTRAM) 50 MG tablet Take 50 mg by mouth every 6 (six) hours as needed for moderate pain or severe pain.    [provider]  Vitamin D, Ergocalciferol, (DRISDOL) 1.25 MG (50000 UNIT) CAPS capsule Take 1 capsule (50,000 Units total) by mouth every 7 (seven) days. Patient not taking: No sig reported 10/06/20   Mercy Riding, MD    Allergies    Codeine  Review of Systems   Review of Systems   Constitutional: Positive for fatigue. Negative for chills, diaphoresis and fever.  Respiratory: Negative for shortness of breath.   Cardiovascular: Positive for chest pain.  Gastrointestinal: Negative for abdominal pain, nausea and vomiting.  Musculoskeletal: Positive for arthralgias.  Neurological: Positive for weakness (generalized) and light-headedness. Negative for dizziness, syncope, speech difficulty and headaches.  All other systems reviewed and are negative.   Physical Exam Updated Vital Signs BP (!) 159/86 (BP Location: Right Arm)   Pulse 73   Temp 98.2 F (36.8 C) (Oral)   Resp 16   Ht 5\' 7"  (1.702 m)   Wt 92.1 kg   SpO2 97%   BMI 31.80 kg/m   Physical Exam Vitals  and nursing note reviewed.  Constitutional:      Appearance: She is not ill-appearing or diaphoretic.  HENT:     Head: Normocephalic and atraumatic.  Eyes:     Conjunctiva/sclera: Conjunctivae normal.  Cardiovascular:     Rate and Rhythm: Normal rate and regular rhythm.     Pulses:          Radial pulses are 2+ on the right side and 2+ on the left side.       Dorsalis pedis pulses are 2+ on the right side and 2+ on the left side.  Pulmonary:     Effort: Pulmonary effort is normal.     Breath sounds: Normal breath sounds. No decreased breath sounds, wheezing, rhonchi or rales.  Abdominal:     Palpations: Abdomen is soft.     Tenderness: There is no abdominal tenderness.  Musculoskeletal:     Cervical back: Neck supple.  Skin:    General: Skin is warm and dry.  Neurological:     Mental Status: She is alert.     ED Results / Procedures / Treatments   Labs (all labs ordered are listed, but only abnormal results are displayed) Labs Reviewed  COMPREHENSIVE METABOLIC PANEL - Abnormal; Notable for the following components:      Result Value   Albumin 3.2 (*)    All other components within normal limits  CBC WITH DIFFERENTIAL/PLATELET - Abnormal; Notable for the following components:   WBC 3.9  (*)    Platelets 146 (*)    All other components within normal limits  URINALYSIS, ROUTINE W REFLEX MICROSCOPIC - Abnormal; Notable for the following components:   Color, Urine STRAW (*)    pH 9.0 (*)    Hgb urine dipstick SMALL (*)    All other components within normal limits  TROPONIN I (HIGH SENSITIVITY)  TROPONIN I (HIGH SENSITIVITY)    EKG EKG Interpretation  Date/Time:  Sunday February 14 2021 09:01:13 EDT Ventricular Rate:  72 PR Interval:  223 QRS Duration: 90 QT Interval:  390 QTC Calculation: 427 R Axis:   76 Text Interpretation: Sinus rhythm Prolonged PR interval Probable left atrial enlargement Abnormal R-wave progression, early transition No significant change since last tracing Confirmed by Knapp, Jon ([id number]) on 02/14/2021 10:45:46 AM   Radiology DG Chest Port 1 View  Result Date: 02/14/2021 CLINICAL DATA:  Chest pain and SOB. EXAM: PORTABLE CHEST 1 VIEW COMPARISON:  01/12/2021 FINDINGS: Left chest wall pacer device is noted with leads in the right atrial appendage and right ventricle. Heart size normal. Chronic asymmetric elevation of left hemidiaphragm. No pleural effusion or edema. No airspace densities identified. IMPRESSION: No acute cardiopulmonary abnormalities. Electronically Signed   By: Taylor  Stroud M.D.   On: 02/14/2021 09:04    Procedures Procedures   Medications Ordered in ED Medications - No data to display  ED Course  I have reviewed the triage vital signs and the nursing notes.  Pertinent labs & imaging results that were available during my care of the patient were reviewed by me and considered in my medical decision making (see chart for details).    MDM Rules/Calculators/A&P                          87  year old female who presents to the ED today with complaint of some generalized weakness and left-sided chest/back/arm pain that began last night, not having any currently.  Was given 4 baby aspirin in route with  improvement in pain.  On  arrival to the ED vitals are stable.  Patient appears to be in no acute distress.  She does mention that she recently had a pacemaker placed on 03/21.  She states that the machine was beeping yesterday they called cardiology office who was planning to have the pacemaker interrogated however recommended she come to the ED.  She is here today for same.  Will work-up for ACS at this time and plan to interrogate pacemaker.  She is anticoagulated with Eliquis and has not missed any doses.  Low suspicion for PE or dissection today.  Patient is comfortable, has equal pulse.  No neuro complaints.  We will plan for orthostatics as well.  Attending physician Dr. Tomi Bamberger has evaluated patient as well and agrees with plan.  EKG without any significant change from last tracing. Chest x-ray clear. CBC without leukocytosis and hemoglobin stable at 12.3. CMP without electrolyte abnormalities. Troponin less than 2.  Given age and history we will plan for repeat troponin. UA without signs of infection. Orthostatics negative  Pacemaker interrogated.  Working properly.  No abnormal events.  Repeat troponin less than 2.  Overall work-up very reassuring today.  We will plan to discharge patient given negative ACS work-up and have her follow-up with her cardiologist tomorrow for same.  Attending physician Dr. Tomi Bamberger agrees with plan to discharge at this time and does not feel patient requires admission or consult to cardiology.  Do not feel it will change treatment plan today.  Have updated patient.  She is in agreement with plan at this time and stable for discharge.   This note was prepared using Dragon voice recognition software and may include unintentional dictation errors due to the inherent limitations of voice recognition software.  Final Clinical Impression(s) / ED Diagnoses Final diagnoses:  Nonspecific chest pain    Rx / DC Orders ED Discharge Orders    None       Discharge Instructions     Please  follow up with your cardiologist in the next 1-2 days for further evaluation of your chest pain and ED visit today.   Return to the ED for any worsening symptoms       Eustaquio Maize, Hershal Coria 02/14/21 1325    Dorie Rank, MD 02/16/21 [phone]

## 2021-02-14 NOTE — ED Triage Notes (Signed)
[facility] from home. Pt states left sided chest pain into arm. States "it's gone now." Hx of recent pacemaker. Also has lower leg swelling that "is not new" per MEDIC. Pt was given "4 baby ASA" in route

## 2021-02-14 NOTE — Discharge Instructions (Addendum)
Please follow up with your cardiologist in the next 1-2 days for further evaluation of your chest pain and ED visit today.   Return to the ED for any worsening symptoms

## 2021-02-17 DIAGNOSIS — I441 Atrioventricular block, second degree: Secondary | ICD-10-CM | POA: Diagnosis not present

## 2021-02-17 DIAGNOSIS — Z7901 Long term (current) use of anticoagulants: Secondary | ICD-10-CM | POA: Diagnosis not present

## 2021-02-17 DIAGNOSIS — H3411 Central retinal artery occlusion, right eye: Secondary | ICD-10-CM | POA: Diagnosis not present

## 2021-02-17 DIAGNOSIS — Z950 Presence of cardiac pacemaker: Secondary | ICD-10-CM | POA: Diagnosis not present

## 2021-02-17 DIAGNOSIS — H5461 Unqualified visual loss, right eye, normal vision left eye: Secondary | ICD-10-CM | POA: Diagnosis not present

## 2021-02-17 DIAGNOSIS — G629 Polyneuropathy, unspecified: Secondary | ICD-10-CM | POA: Diagnosis not present

## 2021-02-17 DIAGNOSIS — Z86711 Personal history of pulmonary embolism: Secondary | ICD-10-CM | POA: Diagnosis not present

## 2021-02-17 DIAGNOSIS — E890 Postprocedural hypothyroidism: Secondary | ICD-10-CM | POA: Diagnosis not present

## 2021-02-17 DIAGNOSIS — I10 Essential (primary) hypertension: Secondary | ICD-10-CM | POA: Diagnosis not present

## 2021-02-17 DIAGNOSIS — Z86718 Personal history of other venous thrombosis and embolism: Secondary | ICD-10-CM | POA: Diagnosis not present

## 2021-02-17 DIAGNOSIS — Z9181 History of falling: Secondary | ICD-10-CM | POA: Diagnosis not present

## 2021-02-17 DIAGNOSIS — Z48812 Encounter for surgical aftercare following surgery on the circulatory system: Secondary | ICD-10-CM | POA: Diagnosis not present

## 2021-02-17 DIAGNOSIS — M479 Spondylosis, unspecified: Secondary | ICD-10-CM | POA: Diagnosis not present

## 2021-02-17 DIAGNOSIS — Z96651 Presence of right artificial knee joint: Secondary | ICD-10-CM | POA: Diagnosis not present

## 2021-02-19 ENCOUNTER — Telehealth: Payer: Self-pay | Admitting: Internal Medicine

## 2021-02-19 NOTE — Telephone Encounter (Signed)
Spoke with pt.  A couple of weeks ago, she had spoken to an after hours MD/PA who had instructed her to go to ED.  At the time of the call her remote was beeping so she was told to just unplug it since she was going to Ed and they could interrogate device there.  She has not plugged the monitor back in since returning home.   With family assistance pt plugged monitor back in today.  Verified on [facility] communication with monitor successful.  Pt v/u to keep plugged in near where she sleeps.

## 2021-02-19 NOTE — Telephone Encounter (Signed)
New message    Nurse want to make doctor aware that patient went to the ER last weekend because the top number on her bp was 146.  Patient was told that if it got over 140 to call.  During her ER visit, patient told the [facility] nurse that she was instructed to stop using her pacemaker monitor.  Nurse is concerned that patient is not clear how the pacemaker monitor works.  Please advise.

## 2021-03-02 ENCOUNTER — Other Ambulatory Visit: Payer: Medicare Other | Admitting: *Deleted

## 2021-03-02 ENCOUNTER — Other Ambulatory Visit: Payer: Self-pay

## 2021-03-02 ENCOUNTER — Telehealth: Payer: Self-pay | Admitting: Internal Medicine

## 2021-03-02 DIAGNOSIS — M7989 Other specified soft tissue disorders: Secondary | ICD-10-CM

## 2021-03-02 NOTE — Telephone Encounter (Signed)
Pt c/o swelling: STAT is pt has developed SOB within 24 hours  1) How much weight have you gained and in what time span?1lb a day last week  2) If swelling, where is the swelling located? Ankles and foot  3) Are you currently taking a fluid pill? yes  4) Are you currently SOB? no  5) Do you have a log of your daily weights (if so, list)? yes  6) Have you gained 3 pounds in a day or 5 pounds in a week? yes  7) Have you traveled recently? no

## 2021-03-02 NOTE — Telephone Encounter (Signed)
Spoke with Pt's Daughter. Patient was advised to take Lasix 20mg  BID by her PCP. She has been doing this for almost 3 weeks and still has swelling, no SOB. After speaking with Oda Kilts, PA - Pt was advised to take Lasix 60mg  x 2 days and come in today for a BMET, BNP. She is not currently taking Potassium.

## 2021-03-03 LAB — BASIC METABOLIC PANEL
BUN/Creatinine Ratio: 10 — ABNORMAL LOW (ref 12–28)
BUN: 8 mg/dL — ABNORMAL LOW (ref 10–36)
CO2: 26 mmol/L (ref 20–29)
Calcium: 9.2 mg/dL (ref 8.7–10.3)
Chloride: 106 mmol/L (ref 96–106)
Creatinine, Ser: 0.82 mg/dL (ref 0.57–1.00)
Glucose: 87 mg/dL (ref 65–99)
Potassium: 3.9 mmol/L (ref 3.5–5.2)
Sodium: 145 mmol/L — ABNORMAL HIGH (ref 134–144)
eGFR: 67 mL/min/{1.73_m2} (ref >59)

## 2021-03-03 LAB — PRO B NATRIURETIC PEPTIDE: NT-Pro BNP: 106 pg/mL (ref 0–738)

## 2021-03-05 ENCOUNTER — Telehealth: Payer: Self-pay

## 2021-03-05 NOTE — Telephone Encounter (Signed)
LM with Pt's Daughter that her labwork was normal. Advised her to call if she is still having symptoms and we will get her an appt to be seen.

## 2021-03-06 DIAGNOSIS — G629 Polyneuropathy, unspecified: Secondary | ICD-10-CM | POA: Diagnosis not present

## 2021-03-06 DIAGNOSIS — R531 Weakness: Secondary | ICD-10-CM | POA: Diagnosis not present

## 2021-03-19 DIAGNOSIS — H179 Unspecified corneal scar and opacity: Secondary | ICD-10-CM | POA: Diagnosis not present

## 2021-03-19 DIAGNOSIS — H3589 Other specified retinal disorders: Secondary | ICD-10-CM | POA: Diagnosis not present

## 2021-03-19 DIAGNOSIS — H5440 Blindness, one eye, unspecified eye: Secondary | ICD-10-CM | POA: Diagnosis not present

## 2021-03-19 DIAGNOSIS — Z961 Presence of intraocular lens: Secondary | ICD-10-CM | POA: Diagnosis not present

## 2021-03-24 DIAGNOSIS — M545 Low back pain, unspecified: Secondary | ICD-10-CM | POA: Diagnosis not present

## 2021-03-24 DIAGNOSIS — Z6832 Body mass index (BMI) 32.0-32.9, adult: Secondary | ICD-10-CM | POA: Diagnosis not present

## 2021-03-24 DIAGNOSIS — S22080D Wedge compression fracture of T11-T12 vertebra, subsequent encounter for fracture with routine healing: Secondary | ICD-10-CM | POA: Diagnosis not present

## 2021-03-24 DIAGNOSIS — R030 Elevated blood-pressure reading, without diagnosis of hypertension: Secondary | ICD-10-CM | POA: Diagnosis not present

## 2021-04-06 DIAGNOSIS — G629 Polyneuropathy, unspecified: Secondary | ICD-10-CM | POA: Diagnosis not present

## 2021-04-06 DIAGNOSIS — R531 Weakness: Secondary | ICD-10-CM | POA: Diagnosis not present

## 2021-04-12 ENCOUNTER — Observation Stay (HOSPITAL_COMMUNITY)
Admission: EM | Admit: 2021-04-12 | Discharge: 2021-04-15 | Disposition: A | Discharge status: Home / Self Care | Payer: Medicare Other | Attending: Hospitalist | Admitting: Hospitalist

## 2021-04-12 ENCOUNTER — Other Ambulatory Visit: Payer: Self-pay

## 2021-04-12 ENCOUNTER — Encounter (HOSPITAL_COMMUNITY): Payer: Self-pay

## 2021-04-12 ENCOUNTER — Ambulatory Visit (INDEPENDENT_AMBULATORY_CARE_PROVIDER_SITE_OTHER): Payer: Medicare Other

## 2021-04-12 ENCOUNTER — Emergency Department (HOSPITAL_COMMUNITY): Payer: Medicare Other

## 2021-04-12 DIAGNOSIS — Z79899 Other long term (current) drug therapy: Secondary | ICD-10-CM | POA: Insufficient documentation

## 2021-04-12 DIAGNOSIS — E890 Postprocedural hypothyroidism: Secondary | ICD-10-CM | POA: Diagnosis not present

## 2021-04-12 DIAGNOSIS — I10 Essential (primary) hypertension: Secondary | ICD-10-CM | POA: Diagnosis not present

## 2021-04-12 DIAGNOSIS — Z96651 Presence of right artificial knee joint: Secondary | ICD-10-CM | POA: Diagnosis not present

## 2021-04-12 DIAGNOSIS — I4430 Unspecified atrioventricular block: Secondary | ICD-10-CM | POA: Diagnosis not present

## 2021-04-12 DIAGNOSIS — K625 Hemorrhage of anus and rectum: Secondary | ICD-10-CM | POA: Diagnosis present

## 2021-04-12 DIAGNOSIS — Z950 Presence of cardiac pacemaker: Secondary | ICD-10-CM | POA: Insufficient documentation

## 2021-04-12 DIAGNOSIS — R1032 Left lower quadrant pain: Secondary | ICD-10-CM | POA: Diagnosis not present

## 2021-04-12 DIAGNOSIS — K922 Gastrointestinal hemorrhage, unspecified: Secondary | ICD-10-CM | POA: Diagnosis not present

## 2021-04-12 DIAGNOSIS — Z20822 Contact with and (suspected) exposure to covid-19: Secondary | ICD-10-CM | POA: Insufficient documentation

## 2021-04-12 DIAGNOSIS — Z86718 Personal history of other venous thrombosis and embolism: Secondary | ICD-10-CM | POA: Diagnosis not present

## 2021-04-12 DIAGNOSIS — Z86711 Personal history of pulmonary embolism: Secondary | ICD-10-CM

## 2021-04-12 DIAGNOSIS — R0902 Hypoxemia: Secondary | ICD-10-CM | POA: Diagnosis not present

## 2021-04-12 DIAGNOSIS — Z7901 Long term (current) use of anticoagulants: Secondary | ICD-10-CM | POA: Diagnosis not present

## 2021-04-12 DIAGNOSIS — R109 Unspecified abdominal pain: Secondary | ICD-10-CM | POA: Diagnosis not present

## 2021-04-12 DIAGNOSIS — R1084 Generalized abdominal pain: Secondary | ICD-10-CM | POA: Diagnosis not present

## 2021-04-12 LAB — COMPREHENSIVE METABOLIC PANEL
ALT: 11 U/L (ref 0–44)
AST: 19 U/L (ref 15–41)
Albumin: 3.3 g/dL — ABNORMAL LOW (ref 3.5–5.0)
Alkaline Phosphatase: 80 U/L (ref 38–126)
Anion gap: 5 (ref 5–15)
BUN: 7 mg/dL — ABNORMAL LOW (ref 8–23)
CO2: 29 mmol/L (ref 22–32)
Calcium: 9.2 mg/dL (ref 8.9–10.3)
Chloride: 105 mmol/L (ref 98–111)
Creatinine, Ser: 0.81 mg/dL (ref 0.44–1.00)
GFR, Estimated: >60 mL/min (ref >60)
Glucose, Bld: 100 mg/dL — ABNORMAL HIGH (ref 70–99)
Potassium: 4.1 mmol/L (ref 3.5–5.1)
Sodium: 139 mmol/L (ref 135–145)
Total Bilirubin: 0.7 mg/dL (ref 0.3–1.2)
Total Protein: 6.9 g/dL (ref 6.5–8.1)

## 2021-04-12 LAB — RESP PANEL BY RT-PCR (FLU A&B, COVID) ARPGX2
Influenza A by PCR: NEGATIVE
Influenza B by PCR: NEGATIVE
SARS Coronavirus 2 by RT PCR: NEGATIVE

## 2021-04-12 LAB — CBC WITH DIFFERENTIAL/PLATELET
Abs Immature Granulocytes: 0.01 10*3/uL (ref 0.00–0.07)
Basophils Absolute: 0 10*3/uL (ref 0.0–0.1)
Basophils Relative: 1 %
Eosinophils Absolute: 0 10*3/uL (ref 0.0–0.5)
Eosinophils Relative: 1 %
HCT: 38 % (ref 36.0–46.0)
Hemoglobin: 12.3 g/dL (ref 12.0–15.0)
Immature Granulocytes: 0 %
Lymphocytes Relative: 22 %
Lymphs Abs: 0.9 10*3/uL (ref 0.7–4.0)
MCH: 27.8 pg (ref 26.0–34.0)
MCHC: 32.4 g/dL (ref 30.0–36.0)
MCV: 86 fL (ref 80.0–100.0)
Monocytes Absolute: 0.4 10*3/uL (ref 0.1–1.0)
Monocytes Relative: 10 %
Neutro Abs: 2.7 10*3/uL (ref 1.7–7.7)
Neutrophils Relative %: 66 %
Platelets: 160 10*3/uL (ref 150–400)
RBC: 4.42 MIL/uL (ref 3.87–5.11)
RDW: 14.7 % (ref 11.5–15.5)
WBC: 4.1 10*3/uL (ref 4.0–10.5)
nRBC: 0 % (ref 0.0–0.2)

## 2021-04-12 LAB — HEMOGLOBIN AND HEMATOCRIT, BLOOD
HCT: 38 % (ref 36.0–46.0)
HCT: 35.7 % — ABNORMAL LOW (ref 36.0–46.0)
Hemoglobin: 12.3 g/dL (ref 12.0–15.0)
Hemoglobin: 11.8 g/dL — ABNORMAL LOW (ref 12.0–15.0)

## 2021-04-12 LAB — TYPE AND SCREEN
ABO/RH(D): B POS
Antibody Screen: NEGATIVE

## 2021-04-12 LAB — POC OCCULT BLOOD, ED: Fecal Occult Bld: POSITIVE — AB

## 2021-04-12 LAB — ABO/RH: ABO/RH(D): B POS

## 2021-04-12 LAB — TSH: TSH: 2.627 u[IU]/mL (ref 0.350–4.500)

## 2021-04-12 LAB — LIPASE, BLOOD: Lipase: 27 U/L (ref 11–51)

## 2021-04-12 MED ORDER — TRAMADOL HCL 50 MG PO TABS
50.0000 mg | ORAL_TABLET | ORAL | Status: DC | PRN
  Administered 2021-04-14: 50 mg via ORAL
  Filled 2021-04-12: qty 1

## 2021-04-12 MED ORDER — MECLIZINE HCL 25 MG PO TABS: 25.0000 mg | ORAL_TABLET | ORAL | Status: DC | PRN

## 2021-04-12 MED ORDER — FUROSEMIDE 20 MG PO TABS: 20.0000 mg | ORAL_TABLET | Freq: Every day | ORAL | Status: DC

## 2021-04-12 MED ORDER — ALBUTEROL SULFATE (2.5 MG/3ML) 0.083% IN NEBU: 2.5000 mg | INHALATION_SOLUTION | Freq: Four times a day (QID) | RESPIRATORY_TRACT | Status: DC | PRN

## 2021-04-12 MED ORDER — SODIUM CHLORIDE 0.9% FLUSH
3.0000 mL | Freq: Two times a day (BID) | INTRAVENOUS | Status: DC
  Administered 2021-04-12 – 2021-04-15 (×5): 3 mL via INTRAVENOUS

## 2021-04-12 MED ORDER — PREGABALIN 75 MG PO CAPS
75.0000 mg | ORAL_CAPSULE | Freq: Two times a day (BID) | ORAL | Status: DC
  Administered 2021-04-12 – 2021-04-15 (×6): 75 mg via ORAL
  Filled 2021-04-12: qty 1
  Filled 2021-04-12: qty 3
  Filled 2021-04-12 (×4): qty 1

## 2021-04-12 MED ORDER — MIRTAZAPINE 15 MG PO TABS
15.0000 mg | ORAL_TABLET | Freq: Every evening | ORAL | Status: DC | PRN
  Administered 2021-04-12: 15 mg via ORAL
  Filled 2021-04-12: qty 1

## 2021-04-12 MED ORDER — IOHEXOL 300 MG/ML  SOLN
100.0000 mL | Freq: Once | INTRAMUSCULAR | Status: AC | PRN
  Administered 2021-04-12: 100 mL via INTRAVENOUS

## 2021-04-12 MED ORDER — POLYVINYL ALCOHOL 1.4 % OP SOLN
1.0000 [drp] | OPHTHALMIC | Status: DC | PRN
  Filled 2021-04-12: qty 15

## 2021-04-12 MED ORDER — SODIUM CHLORIDE 0.9 % IV BOLUS
1000.0000 mL | Freq: Once | INTRAVENOUS | Status: AC
  Administered 2021-04-12: 1000 mL via INTRAVENOUS

## 2021-04-12 MED ORDER — LEVOTHYROXINE SODIUM 75 MCG PO TABS
75.0000 ug | ORAL_TABLET | Freq: Every day | ORAL | Status: DC
  Administered 2021-04-13 – 2021-04-15 (×3): 75 ug via ORAL
  Filled 2021-04-12 (×3): qty 1

## 2021-04-12 MED ORDER — SALINE SPRAY 0.65 % NA SOLN
1.0000 | NASAL | Status: DC | PRN
  Filled 2021-04-12: qty 44

## 2021-04-12 MED ORDER — PANTOPRAZOLE SODIUM 40 MG PO TBEC: 40.0000 mg | DELAYED_RELEASE_TABLET | Freq: Every day | ORAL | Status: DC | PRN

## 2021-04-12 MED ORDER — ACETAMINOPHEN 650 MG RE SUPP: 650.0000 mg | Freq: Four times a day (QID) | RECTAL | Status: DC | PRN

## 2021-04-12 MED ORDER — ONDANSETRON HCL 4 MG PO TABS: 4.0000 mg | ORAL_TABLET | Freq: Four times a day (QID) | ORAL | Status: DC | PRN

## 2021-04-12 MED ORDER — PANTOPRAZOLE SODIUM 40 MG IV SOLR
40.0000 mg | Freq: Once | INTRAVENOUS | Status: AC
  Administered 2021-04-12: 40 mg via INTRAVENOUS
  Filled 2021-04-12: qty 40

## 2021-04-12 MED ORDER — ACETAMINOPHEN 325 MG PO TABS
650.0000 mg | ORAL_TABLET | Freq: Four times a day (QID) | ORAL | Status: DC | PRN
  Administered 2021-04-14 – 2021-04-15 (×3): 650 mg via ORAL
  Filled 2021-04-12 (×3): qty 2

## 2021-04-12 MED ORDER — ONDANSETRON HCL 4 MG/2ML IJ SOLN: 4.0000 mg | Freq: Four times a day (QID) | INTRAMUSCULAR | Status: DC | PRN

## 2021-04-12 MED ORDER — PANTOPRAZOLE SODIUM 40 MG PO TBEC
40.0000 mg | DELAYED_RELEASE_TABLET | Freq: Every day | ORAL | Status: DC
  Administered 2021-04-13 – 2021-04-15 (×3): 40 mg via ORAL
  Filled 2021-04-12 (×3): qty 1

## 2021-04-12 NOTE — ED Notes (Signed)
Patient having abdominal pain, large dark red with clots bowel movement. Patient is clammy, hypotensive. [facility] notified, awaiting call back.

## 2021-04-12 NOTE — H&P (Signed)
History and Physical    Farha Dano TMH:[id number] DOB: 08-11-1927 DOA: 04/12/2021  Referring MD/NP/PA: Deno Etienne, MD PCP: [name], DO  Patient coming from: Via EMS  Chief Complaint: Rectal bleeding  I have personally briefly reviewed patient's old medical records in [place]   HPI: [name] is a 87 y.o. female with medical history significant of hypertension, DVT/PE on Eliquis, s/p PM 12/2020, and hypothyroidism presents with complaints of rectal bleeding that started this morning while taking a shower.  Patient reported seeing bright red blood with what sounds like blood clots.  She complained to having some lower abdominal crampy discomfort, palpitations, lightheadedness.  Denies any loss of consciousness or recent falls.  She last took Eliquis yesterday evening.  Patient reports that she had issues with bleeding previously or for which she was seen by [facility] gastroenterology back in 2019.  Records seem to report diverticulosis with bleeding as a possible cause, but patient reports they found no source.  Patient seems to state that they had previously told her not to be on blood thinners, but subsequently suffered a blood clot in 12/2018 that seem to be unprovoked for which patient was restarted on anticoagulation at that time.  ED Course: Upon admission into the emergency department patient was seen to be afebrile, respirations 16-27, and all other vital signs maintained.  Labs significant for hemoglobin 12.3.  Stool guaiacs were noted to be positive without reports of fissure or hemorrhoids on rectal exam.  Patient was typed and screened for the possible need of blood products.  CT scan of the abdomen pelvis was significant for diverticulosis without signs of diverticulitis and, fatty liver, and gallstones without complication.  GI had been consulted.  Patient had been given Protonix 40 mg IV.  Review of Systems  Constitutional:  Positive for malaise/fatigue. Negative  for fever.  HENT:  Negative for ear pain and nosebleeds.   Eyes:  Negative for photophobia.  Respiratory:  Negative for cough and shortness of breath.   Cardiovascular:  Positive for palpitations. Negative for chest pain and leg swelling.  Gastrointestinal:  Positive for abdominal pain and blood in stool. Negative for nausea and vomiting.  Genitourinary:  Negative for dysuria and hematuria.  Musculoskeletal:  Negative for falls.  Neurological:  Positive for dizziness. Negative for loss of consciousness.  Psychiatric/Behavioral:  Negative for substance abuse.    Past Medical History:  Diagnosis Date   Central retinal artery occlusion of right eye 2015   lost complete vision   Edema    Hypertension    not on BP medication currently   Neuropathy    Post-surgical hypothyroidism    Pulmonary emboli (HCC)    VTE (venous thromboembolism) 12/2018   LLE DVT and PE    Past Surgical History:  Procedure Laterality Date   PACEMAKER IMPLANT N/A 01/11/2021   Procedure: PACEMAKER IMPLANT;  Surgeon: Evans Lance, MD;  Location: [facility];  Service: Cardiovascular;  Laterality: N/A;   REPLACEMENT TOTAL KNEE Right 2008   TOTAL THYROIDECTOMY       reports that she has never smoked. She has never used smokeless tobacco. She reports that she does not drink alcohol and does not use drugs.  Allergies  Allergen Reactions   Codeine Other (See Comments)    Caused vertigo to the point of passing out    No family history on file.  Prior to Admission medications   Medication Sig Start Date End Date Taking? Authorizing Provider  acetaminophen (  TYLENOL) 500 MG tablet Take 2 tablets (1,000 mg total) by mouth every 8 (eight) hours. Patient taking differently: Take 1,000 mg by mouth every 8 (eight) hours as needed for mild pain (or discomfort). 10/01/20   Mercy Riding, MD  docusate sodium (COLACE) 100 MG capsule Take 1 capsule (100 mg total) by mouth 2 (two) times daily. Patient not taking:  No sig reported 10/05/20   Mercy Riding, MD  ELIQUIS 5 MG TABS tablet Take 1 tablet (5 mg total) by mouth 2 (two) times daily. 01/16/21   Shirley Friar, PA-C  furosemide (LASIX) 20 MG tablet Take 20 mg by mouth daily as needed for fluid or edema.    [provider]  hydrochlorothiazide (MICROZIDE) 12.5 MG capsule Take 1 capsule (12.5 mg total) by mouth daily. Patient not taking: No sig reported 10/05/20 10/05/21  Mercy Riding, MD  HYDROcodone-acetaminophen (NORCO/VICODIN) 5-325 MG tablet Take 0.5-1 tablets by mouth every 6 (six) hours as needed for moderate pain.    [provider]  levothyroxine (SYNTHROID) 75 MCG tablet Take 75 mcg by mouth daily before breakfast.    [provider]  magnesium citrate SOLN Take 296 mLs (1 Bottle total) by mouth daily as needed for severe constipation. Patient not taking: No sig reported 10/05/20   Mercy Riding, MD  magnesium hydroxide (MILK OF MAGNESIA) 400 MG/5ML suspension Take 15-30 mLs by mouth daily as needed for mild constipation.    [provider]  meclizine (ANTIVERT) 25 MG tablet Take 25 mg by mouth 3 (three) times daily as needed for dizziness.    [provider]  mirtazapine (REMERON) 15 MG tablet Take 15 mg by mouth at bedtime.    [provider]  NON FORMULARY Take 30-60 mLs by mouth See admin instructions. Prune juice- Drink 30-60 ml's by mouth as needed for constipation    [provider]  omeprazole (PRILOSEC) 20 MG capsule Take 20 mg by mouth daily after lunch.    [provider]  polyethylene glycol (MIRALAX / GLYCOLAX) 17 g packet Take 17 g by mouth 2 (two) times daily as needed for mild constipation. Patient not taking: No sig reported 10/05/20   Mercy Riding, MD  polyvinyl alcohol (LIQUIFILM TEARS) 1.4 % ophthalmic solution Place 1 drop into both eyes as needed for dry eyes. 10/05/20   Mercy Riding, MD  pregabalin (LYRICA) 75 MG capsule Take 1 capsule (75 mg  total) by mouth 2 (two) times daily. 10/05/20   Mercy Riding, MD  senna-docusate (SENOKOT-S) 8.6-50 MG tablet Take 2 tablets by mouth 2 (two) times daily as needed for moderate constipation. Patient not taking: No sig reported 10/01/20   Mercy Riding, MD  sodium chloride (OCEAN) 0.65 % SOLN nasal spray Place 1 spray into both nostrils as needed for congestion.    [provider]  traMADol (ULTRAM) 50 MG tablet Take 50 mg by mouth every 6 (six) hours as needed for moderate pain or severe pain.    [provider]  Vitamin D, Ergocalciferol, (DRISDOL) 1.25 MG (50000 UNIT) CAPS capsule Take 1 capsule (50,000 Units total) by mouth every 7 (seven) days. Patient not taking: No sig reported 10/06/20   Mercy Riding, MD    Physical Exam:  Constitutional: Elderly female currently in no acute distress Vitals:   04/12/21 1300 04/12/21 1330 04/12/21 1400 04/12/21 1500  BP: (!) 159/75 (!) 151/79 (!) 143/76 134/79  Pulse: 72 73 73 72  Resp: (!) 21 (!) 27 17 (!) 22  Temp:      TempSrc:      SpO2: 95% 94% 96% 98%  Weight:      Height:       Eyes: PERRL, lids and conjunctivae normal ENMT: Mucous membranes are moist. Posterior pharynx clear of any exudate or lesions.  Neck: normal, supple, no masses, no thyromegaly Respiratory: clear to auscultation bilaterally, no wheezing, no crackles. Normal respiratory effort. No accessory muscle use.  Cardiovascular: Regular rate and rhythm, no murmurs / rubs / gallops.  Trace left lower extremity edema.  2+ pedal pulses. No carotid bruits.  Abdomen: Mild lower abdominal tenderness appreciated.  Bowel sounds present in all 4 quadrants. Musculoskeletal: no clubbing / cyanosis. No joint deformity upper and lower extremities. Good ROM, no contractures. Normal muscle tone.  Skin: no rashes, lesions, ulcers. No induration Neurologic: CN 2-12 grossly intact. Sensation intact, DTR normal. Strength 5/5 in all 4.  Psychiatric: Mild memory impairment.  Alert and oriented x 3. Normal mood.     Labs on Admission: I have personally reviewed following labs and imaging studies  CBC: Recent Labs  Lab 04/12/21 1200  WBC 4.1  NEUTROABS 2.7  HGB 12.3  HCT 38.0  MCV 86.0  PLT 761   Basic Metabolic Panel: Recent Labs  Lab 04/12/21 1200  NA 139  K 4.1  CL 105  CO2 29  GLUCOSE 100*  BUN 7*  CREATININE 0.81  CALCIUM 9.2   GFR: Estimated Creatinine Clearance: 51.6 mL/min (by C-G formula based on SCr of 0.81 mg/dL). Liver Function Tests: Recent Labs  Lab 04/12/21 1200  AST 19  ALT 11  ALKPHOS 80  BILITOT 0.7  PROT 6.9  ALBUMIN 3.3*   Recent Labs  Lab 04/12/21 1200  LIPASE 27   No results for input(s): AMMONIA in the last 168 hours. Coagulation Profile: No results for input(s): INR, PROTIME in the last 168 hours. Cardiac Enzymes: No results for input(s): CKTOTAL, CKMB, CKMBINDEX, TROPONINI in the last 168 hours. BNP (last 3 results) Recent Labs    03/02/21 1237  PROBNP 106   HbA1C: No results for input(s): HGBA1C in the last 72 hours. CBG: No results for input(s): GLUCAP in the last 168 hours. Lipid Profile: No results for input(s): CHOL, HDL, LDLCALC, TRIG, CHOLHDL, LDLDIRECT in the last 72 hours. Thyroid Function Tests: No results for input(s): TSH, T4TOTAL, FREET4, T3FREE, THYROIDAB in the last 72 hours. Anemia Panel: No results for input(s): VITAMINB12, FOLATE, FERRITIN, TIBC, IRON, RETICCTPCT in the last 72 hours. Urine analysis:    Component Value Date/Time   COLORURINE STRAW (A) 02/14/2021 1100   APPEARANCEUR CLEAR 02/14/2021 1100   LABSPEC 1.006 02/14/2021 1100   PHURINE 9.0 (H) 02/14/2021 1100   GLUCOSEU NEGATIVE 02/14/2021 1100   HGBUR SMALL (A) 02/14/2021 1100   BILIRUBINUR NEGATIVE 02/14/2021 1100   KETONESUR NEGATIVE 02/14/2021 1100   PROTEINUR NEGATIVE 02/14/2021 1100   NITRITE NEGATIVE 02/14/2021 1100   LEUKOCYTESUR NEGATIVE 02/14/2021 1100   Sepsis Labs: No results found for this  or any previous visit (from the past 240 hour(s)).   Radiological Exams on Admission: CT ABDOMEN PELVIS W CONTRAST  Result Date: 04/12/2021 CLINICAL DATA:  Left-sided abdominal pain EXAM: CT ABDOMEN AND PELVIS WITH CONTRAST TECHNIQUE: Multidetector CT imaging of the abdomen and pelvis was performed using the standard protocol following bolus administration of intravenous contrast. CONTRAST:  125mL OMNIPAQUE IOHEXOL 300 MG/ML  SOLN COMPARISON:  12/24/2020 FINDINGS: Lower chest: Mild scarring  is noted in the bases bilaterally. Calcified granuloma is noted in the left lung base. Hepatobiliary: Fatty infiltration of the liver is noted. The gallbladder is well distended with stable appearing small layering stones similar to that noted on prior exam. Pancreas: Unremarkable. No pancreatic ductal dilatation or surrounding inflammatory changes. Spleen: Normal in size without focal abnormality. Adrenals/Urinary Tract: Adrenal glands are within normal limits. Renal cystic changes noted bilaterally. These changes are stable from the prior study. No renal calculi or obstructive changes are noted. Bladder is well distended. Stomach/Bowel: Diverticular change of the colon is noted without evidence of diverticulitis. The appendix is not well visualized although no inflammatory changes are noted to suggest appendicitis. Small bowel and stomach appear within normal limits. Vascular/Lymphatic: Aortic atherosclerosis. No enlarged abdominal or pelvic lymph nodes. Reproductive: Status post hysterectomy. No adnexal masses. Other: No abdominal wall hernia or abnormality. No abdominopelvic ascites. Musculoskeletal: Degenerative changes of the hip joints and lumbar spine are noted. Chronic T12 compression deformity is noted. This has progressed slightly in the interval from the prior exam. IMPRESSION: Diverticulosis without diverticulitis. Fatty liver. Gallstones without complicating factors. Chronic changes similar to that seen on the  prior exam. Electronically Signed   By: Inez Catalina M.D.   On: 04/12/2021 15:13    EKG: Independently reviewed.  Sinus rhythm at 77 bpm with abnormal R wave progression.  Assessment/Plan Suspected lower GI: Patient presents with complaint of bright red blood per rectum.  Hemoglobin currently appears stable at 12.3 g/dL she was typed and screened for possible need of blood products. -Admit to a medical telemetry bed -Clear liquid diet -Serial monitoring of H&H -Transfuse blood products as needed for hemoglobin less than 8 g/dL or patient symptomatic. -[facility] consulted, will follow-up for any further recommendations  Essential hypertension: On admission blood pressures elevated up to 170/99.  Home medications include furosemide 20 mg daily. -Continue furosemide  History of DVT/PE on chronic anticoagulation: Patient appeared to have been unprovoked DVT back in 12/2018 for which she was started on Eliquis. -Hold Eliquis  History of GI bleed: Patient reports history of a GI bleed that appears possibly occurred back in 2019 for which patient was seen by [facility] gastroenterology.  Symptomatic bradycardia s/p pacemaker: Patient had a Hudson dual-chamber pacemaker placed on 01/11/2021 by Dr. Lovena Le. -Continue outpatient follow-up with cardiology  Diastolic dysfunction: Patient appears to be euvolemic at this time.  Last EF noted to be 65 to 70% with grade 1 diastolic dysfunction in 9/47/6546.   -Continue to monitor  Hypothyroidism -Add on TSH -Continue levothyroxine  GERD -Pharmacy substitution of Protonix scheduled daily  Obesity: BMI of 31.8 kg/m   DVT prophylaxis: SCDs Code Status: Full Family Communication: Daughter updated at bedside Disposition Plan: Home Consults called: GI Admission status: Observation  Norval Morton MD [facility]   If 7PM-7AM, please contact night-coverage   04/12/2021, 3:32 PM

## 2021-04-12 NOTE — ED Triage Notes (Signed)
Pt arrived via [facility] from home for weakness 5/10 pain LLQ of abdomen, rectal bleeding started at 1000 today. Per EMS, pt told them she passed a clot. Pt does take eliquis. Pt is A&Ox4.

## 2021-04-12 NOTE — ED Notes (Signed)
Unable to get MSE signed, signature pad isn't working

## 2021-04-12 NOTE — ED Provider Notes (Signed)
[facility] EMERGENCY DEPARTMENT Provider Note   CSN: [id number] Arrival date & time: 04/12/21  1141     History Chief Complaint  Patient presents with   Weakness    [name] is a 87 y.o. female.  87 yo F with a cc of GI bleeding.  She noticed this this morning when she was taking a shower.  Had a very large clot come out of her bottom.  Since then has had a few bowel movements that have been dark mixed with bright red blood.  She been feeling a bit weak since yesterday.  Her stomach feels a bit upset and feels crampy comes and goes and radiates to the left side of her back.  No prior history of GI bleeding.  She is on Eliquis for DVT in the past.  The history is provided by the patient.  Weakness Severity:  Moderate Onset quality:  Sudden Duration:  2 days Timing:  Constant Progression:  Worsening Chronicity:  New Relieved by:  Nothing Worsened by:  Nothing Ineffective treatments:  None tried Associated symptoms: no arthralgias, no chest pain, no dizziness, no dysuria, no fever, no headaches, no myalgias, no nausea, no shortness of breath, no urgency and no vomiting       Past Medical History:  Diagnosis Date   Central retinal artery occlusion of right eye 2015   lost complete vision   Edema    Hypertension    not on BP medication currently   Neuropathy    Post-surgical hypothyroidism    Pulmonary emboli (HCC)    VTE (venous thromboembolism) 12/2018   LLE DVT and PE    Patient Active Problem List   Diagnosis Date Noted   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     No family history on file.  Social History   Tobacco Use   Smoking status: Never   Smokeless tobacco: Never  Substance Use Topics   Alcohol use: Never   Drug use: Never    Home Medications Prior to Admission medications   Medication Sig Start Date End Date Taking? Authorizing Provider  acetaminophen (TYLENOL) 500 MG tablet Take 2 tablets (1,000 mg total) by mouth every 8 (eight) hours. Patient taking differently: Take 1,000 mg by mouth every 8 (eight) hours as needed for mild pain (or discomfort). 10/01/20   Mercy Riding, MD  docusate sodium (COLACE) 100 MG capsule Take 1 capsule (100 mg total) by mouth 2 (two) times daily. Patient not taking: No sig reported 10/05/20   Mercy Riding, MD  ELIQUIS 5 MG TABS tablet Take 1 tablet (5 mg total) by mouth 2 (two) times daily. 01/16/21   Shirley Friar, PA-C  furosemide (LASIX) 20 MG tablet Take 20 mg by mouth daily as needed for fluid or edema.    [provider]  hydrochlorothiazide (MICROZIDE) 12.5 MG capsule Take 1 capsule (12.5 mg total) by mouth daily. Patient not taking: No sig reported 10/05/20 10/05/21  Mercy Riding, MD  HYDROcodone-acetaminophen (NORCO/VICODIN) 5-325 MG tablet Take 0.5-1 tablets by mouth every 6 (six) hours as needed for moderate pain.    [provider]  levothyroxine (SYNTHROID) 75 MCG tablet Take 75 mcg by mouth daily before breakfast.    [provider]  magnesium citrate SOLN Take 296 mLs (1 Bottle total) by mouth daily as needed for severe constipation. Patient not taking: No sig reported 10/05/20   Mercy Riding, MD  magnesium hydroxide (MILK OF MAGNESIA) 400 MG/5ML suspension Take 15-30 mLs by mouth daily as needed for mild constipation.    [provider]  meclizine (ANTIVERT) 25 MG tablet Take 25 mg by mouth 3 (three) times daily as needed for dizziness.    [provider]  mirtazapine (REMERON) 15 MG tablet Take 15 mg by mouth at bedtime.    [provider]  NON FORMULARY Take 30-60 mLs by mouth See admin instructions. Prune juice- Drink 30-60 ml's by mouth as needed for constipation    [provider]  omeprazole (PRILOSEC) 20 MG capsule Take 20 mg by mouth daily after lunch.    [provider]  polyethylene glycol (MIRALAX / GLYCOLAX) 17 g packet Take 17 g by mouth 2 (two) times daily as needed for mild constipation. Patient not taking: No sig reported 10/05/20   Mercy Riding, MD  polyvinyl alcohol (LIQUIFILM TEARS) 1.4 % ophthalmic solution Place 1 drop into both eyes as needed for dry eyes. 10/05/20   Mercy Riding, MD  pregabalin (LYRICA) 75 MG capsule Take 1 capsule (75 mg total) by mouth 2 (two) times daily. 10/05/20   Mercy Riding, MD  senna-docusate (SENOKOT-S) 8.6-50 MG tablet Take 2 tablets by mouth 2 (two) times daily as needed for moderate constipation. Patient not taking: No sig reported 10/01/20   Mercy Riding, MD  sodium chloride (OCEAN) 0.65 % SOLN nasal spray Place 1 spray into both nostrils as needed for congestion.    [provider]  traMADol (ULTRAM) 50 MG tablet Take 50 mg by mouth every 6 (six) hours as needed for moderate pain or severe pain.    [provider]  Vitamin D, Ergocalciferol, (DRISDOL) 1.25 MG (50000 UNIT) CAPS capsule Take 1 capsule (50,000 Units total) by mouth every 7 (seven) days. Patient not taking: No sig reported 10/06/20   Mercy Riding, MD    Allergies    Codeine  Review of Systems   Review of Systems  Constitutional:  Negative for chills and fever.  HENT:  Negative for congestion and rhinorrhea.   Eyes:  Negative for redness and visual disturbance.  Respiratory:  Negative for shortness of breath and wheezing.   Cardiovascular:  Negative for chest pain and palpitations.  Gastrointestinal:  Positive for anal bleeding and blood in stool. Negative for  nausea and vomiting.  Genitourinary:  Negative for dysuria and urgency.  Musculoskeletal:  Negative for arthralgias and myalgias.  Skin:  Negative for pallor and wound.  Neurological:  Positive for weakness. Negative for dizziness and headaches.   Physical Exam Updated Vital Signs BP 134/79   Pulse 72   Temp 98.3 F (36.8 C) (Oral)   Resp (!) 22   Ht 5\' 7"  (1.702 m)   Wt 92.1 kg   SpO2 98%   BMI 31.80 kg/m   Physical Exam Vitals and nursing note reviewed.  Constitutional:      General: She is not in acute distress.    Appearance:  She is well-developed. She is not diaphoretic.  HENT:     Head: Normocephalic and atraumatic.  Eyes:     Pupils: Pupils are equal, round, and reactive to light.  Cardiovascular:     Rate and Rhythm: Normal rate and regular rhythm.     Heart sounds: No murmur heard.   No friction rub. No gallop.  Pulmonary:     Effort: Pulmonary effort is normal.     Breath sounds: No wheezing or rales.  Abdominal:     General: There is no distension.     Palpations: Abdomen is soft.     Tenderness: There is no abdominal tenderness.     Comments: Mild diffuse abdominal tenderness  Genitourinary:    Comments: BRPR, no hemorrhoids, no fissure  Musculoskeletal:        General: No tenderness.     Cervical back: Normal range of motion and neck supple.  Skin:    General: Skin is warm and dry.  Neurological:     Mental Status: She is alert and oriented to person, place, and time.  Psychiatric:        Behavior: Behavior normal.    ED Results / Procedures / Treatments   Labs (all labs ordered are listed, but only abnormal results are displayed) Labs Reviewed  COMPREHENSIVE METABOLIC PANEL - Abnormal; Notable for the following components:      Result Value   Glucose, Bld 100 (*)    BUN 7 (*)    Albumin 3.3 (*)    All other components within normal limits  POC OCCULT BLOOD, ED - Abnormal; Notable for the following components:   Fecal Occult Bld POSITIVE  (*)    All other components within normal limits  RESP PANEL BY RT-PCR (FLU A&B, COVID) ARPGX2  CBC WITH DIFFERENTIAL/PLATELET  LIPASE, BLOOD  TYPE AND SCREEN  ABO/RH    EKG EKG Interpretation  Date/Time:  Monday April 12 2021 12:02:52 EDT Ventricular Rate:  77 PR Interval:  184 QRS Duration: 95 QT Interval:  389 QTC Calculation: 441 R Axis:   80 Text Interpretation: Sinus rhythm Abnormal R-wave progression, early transition No significant change since last tracing Confirmed by Deno Etienne [phone]) on 04/12/2021 12:04:51 PM  Radiology CT ABDOMEN PELVIS W CONTRAST  Result Date: 04/12/2021 CLINICAL DATA:  Left-sided abdominal pain EXAM: CT ABDOMEN AND PELVIS WITH CONTRAST TECHNIQUE: Multidetector CT imaging of the abdomen and pelvis was performed using the standard protocol following bolus administration of intravenous contrast. CONTRAST:  110mL OMNIPAQUE IOHEXOL 300 MG/ML  SOLN COMPARISON:  12/24/2020 FINDINGS: Lower chest: Mild scarring is noted in the bases bilaterally. Calcified granuloma is noted in the left lung base. Hepatobiliary: Fatty infiltration of the liver is noted. The gallbladder is well distended with stable appearing small layering stones similar to that noted on prior exam. Pancreas: Unremarkable. No pancreatic ductal dilatation or surrounding inflammatory changes. Spleen: Normal in size without focal abnormality. Adrenals/Urinary Tract: Adrenal glands are within normal limits. Renal cystic changes noted bilaterally. These changes are stable from the prior study. No renal calculi or obstructive changes are noted. Bladder is well distended. Stomach/Bowel: Diverticular change of the colon is noted without evidence of diverticulitis. The appendix is not well visualized although no inflammatory changes are noted to suggest appendicitis. Small bowel and stomach appear within normal limits. Vascular/Lymphatic: Aortic atherosclerosis. No enlarged abdominal or pelvic lymph nodes.  Reproductive: Status post hysterectomy. No adnexal masses. Other: No abdominal wall hernia or abnormality. No abdominopelvic ascites. Musculoskeletal: Degenerative changes  of the hip joints and lumbar spine are noted. Chronic T12 compression deformity is noted. This has progressed slightly in the interval from the prior exam. IMPRESSION: Diverticulosis without diverticulitis. Fatty liver. Gallstones without complicating factors. Chronic changes similar to that seen on the prior exam. Electronically Signed   By: Inez Catalina M.D.   On: 04/12/2021 15:13    Procedures Procedures   Medications Ordered in ED Medications  pantoprazole (PROTONIX) injection 40 mg (40 mg Intravenous Given 04/12/21 1220)  iohexol (OMNIPAQUE) 300 MG/ML solution 100 mL (100 mLs Intravenous Contrast Given 04/12/21 1436)    ED Course  I have reviewed the triage vital signs and the nursing notes.  Pertinent labs & imaging results that were available during my care of the patient were reviewed by me and considered in my medical decision making (see chart for details).    MDM Rules/Calculators/A&P                          87 yo F with a chief complaints of concern for GI bleed.  She is describing melena on her history.  We will give a dose of Protonix here.  Blood work.  Reassess.  Hbg stable at 12, will obtain CT.   CT scan without obvious cause of the patient's bleeding.  Will discuss with GI and medicine for admission.  The patients results and plan were reviewed and discussed.   Any x-rays performed were independently reviewed by myself.   Differential diagnosis were considered with the presenting HPI.  Medications  pantoprazole (PROTONIX) injection 40 mg (40 mg Intravenous Given 04/12/21 1220)  iohexol (OMNIPAQUE) 300 MG/ML solution 100 mL (100 mLs Intravenous Contrast Given 04/12/21 1436)    Vitals:   04/12/21 1300 04/12/21 1330 04/12/21 1400 04/12/21 1500  BP: (!) 159/75 (!) 151/79 (!) 143/76 134/79  Pulse:  72 73 73 72  Resp: (!) 21 (!) 27 17 (!) 22  Temp:      TempSrc:      SpO2: 95% 94% 96% 98%  Weight:      Height:        Final diagnoses:  Lower GI bleed    Admission/ observation were discussed with the admitting physician, patient and/or family and they are comfortable with the plan.    Final Clinical Impression(s) / ED Diagnoses Final diagnoses:  Lower GI bleed    Rx / DC Orders ED Discharge Orders     None        Deno Etienne, DO 04/12/21 1520

## 2021-04-13 ENCOUNTER — Encounter (HOSPITAL_COMMUNITY): Payer: Self-pay | Admitting: Internal Medicine

## 2021-04-13 DIAGNOSIS — K922 Gastrointestinal hemorrhage, unspecified: Secondary | ICD-10-CM | POA: Diagnosis not present

## 2021-04-13 DIAGNOSIS — Z86718 Personal history of other venous thrombosis and embolism: Secondary | ICD-10-CM | POA: Diagnosis not present

## 2021-04-13 DIAGNOSIS — Z86711 Personal history of pulmonary embolism: Secondary | ICD-10-CM | POA: Diagnosis not present

## 2021-04-13 LAB — CBC
HCT: 32.1 % — ABNORMAL LOW (ref 36.0–46.0)
HCT: 31.8 % — ABNORMAL LOW (ref 36.0–46.0)
HCT: 32.7 % — ABNORMAL LOW (ref 36.0–46.0)
HCT: 33.6 % — ABNORMAL LOW (ref 36.0–46.0)
Hemoglobin: 10.7 g/dL — ABNORMAL LOW (ref 12.0–15.0)
Hemoglobin: 10.5 g/dL — ABNORMAL LOW (ref 12.0–15.0)
Hemoglobin: 10.6 g/dL — ABNORMAL LOW (ref 12.0–15.0)
Hemoglobin: 10.9 g/dL — ABNORMAL LOW (ref 12.0–15.0)
MCH: 28.4 pg (ref 26.0–34.0)
MCH: 27.8 pg (ref 26.0–34.0)
MCH: 27.6 pg (ref 26.0–34.0)
MCH: 27.6 pg (ref 26.0–34.0)
MCHC: 33.3 g/dL (ref 30.0–36.0)
MCHC: 33 g/dL (ref 30.0–36.0)
MCHC: 32.4 g/dL (ref 30.0–36.0)
MCHC: 32.4 g/dL (ref 30.0–36.0)
MCV: 85.1 fL (ref 80.0–100.0)
MCV: 84.1 fL (ref 80.0–100.0)
MCV: 85.2 fL (ref 80.0–100.0)
MCV: 85.1 fL (ref 80.0–100.0)
Platelets: 146 10*3/uL — ABNORMAL LOW (ref 150–400)
Platelets: 136 10*3/uL — ABNORMAL LOW (ref 150–400)
Platelets: 145 10*3/uL — ABNORMAL LOW (ref 150–400)
Platelets: 146 10*3/uL — ABNORMAL LOW (ref 150–400)
RBC: 3.77 MIL/uL — ABNORMAL LOW (ref 3.87–5.11)
RBC: 3.78 MIL/uL — ABNORMAL LOW (ref 3.87–5.11)
RBC: 3.84 MIL/uL — ABNORMAL LOW (ref 3.87–5.11)
RBC: 3.95 MIL/uL (ref 3.87–5.11)
RDW: 14.7 % (ref 11.5–15.5)
RDW: 14.6 % (ref 11.5–15.5)
RDW: 14.8 % (ref 11.5–15.5)
RDW: 14.7 % (ref 11.5–15.5)
WBC: 7 10*3/uL (ref 4.0–10.5)
WBC: 5.5 10*3/uL (ref 4.0–10.5)
WBC: 4.9 10*3/uL (ref 4.0–10.5)
WBC: 5.2 10*3/uL (ref 4.0–10.5)
nRBC: 0 % (ref 0.0–0.2)
nRBC: 0 % (ref 0.0–0.2)
nRBC: 0 % (ref 0.0–0.2)
nRBC: 0 % (ref 0.0–0.2)

## 2021-04-13 LAB — BASIC METABOLIC PANEL
Anion gap: 5 (ref 5–15)
BUN: 11 mg/dL (ref 8–23)
CO2: 28 mmol/L (ref 22–32)
Calcium: 8.6 mg/dL — ABNORMAL LOW (ref 8.9–10.3)
Chloride: 107 mmol/L (ref 98–111)
Creatinine, Ser: 0.7 mg/dL (ref 0.44–1.00)
GFR, Estimated: >60 mL/min (ref >60)
Glucose, Bld: 111 mg/dL — ABNORMAL HIGH (ref 70–99)
Potassium: 3.8 mmol/L (ref 3.5–5.1)
Sodium: 140 mmol/L (ref 135–145)

## 2021-04-13 LAB — CUP PACEART REMOTE DEVICE CHECK
Battery Remaining Longevity: 107 mo
Battery Remaining Percentage: 95.5 %
Battery Voltage: 3.04 V
Brady Statistic AP VP Percent: 1 %
Brady Statistic AP VS Percent: 1 %
Brady Statistic AS VP Percent: 1.6 %
Brady Statistic AS VS Percent: 98 %
Brady Statistic RA Percent Paced: 1 %
Brady Statistic RV Percent Paced: 1.8 %
Date Time Interrogation Session: 20220620020013
Implantable Lead Implant Date: 20220321
Implantable Lead Implant Date: 20220321
Implantable Lead Location: 753859
Implantable Lead Location: 753860
Implantable Pulse Generator Implant Date: 20220321
Lead Channel Impedance Value: 530 Ohm
Lead Channel Impedance Value: 590 Ohm
Lead Channel Pacing Threshold Amplitude: 0.75 V
Lead Channel Pacing Threshold Amplitude: 0.75 V
Lead Channel Pacing Threshold Pulse Width: 0.4 ms
Lead Channel Pacing Threshold Pulse Width: 0.4 ms
Lead Channel Sensing Intrinsic Amplitude: 3.3 mV
Lead Channel Sensing Intrinsic Amplitude: 9 mV
Lead Channel Setting Pacing Amplitude: 3.5 V
Lead Channel Setting Pacing Amplitude: 3.5 V
Lead Channel Setting Pacing Pulse Width: 0.4 ms
Lead Channel Setting Sensing Sensitivity: 2 mV
Pulse Gen Model: 2272
Pulse Gen Serial Number: 3910619

## 2021-04-13 MED ORDER — SODIUM CHLORIDE 0.9 % IV SOLN: INTRAVENOUS | Status: DC

## 2021-04-13 NOTE — ED Notes (Signed)
Attempted report 

## 2021-04-13 NOTE — Progress Notes (Signed)
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   Patient reports that she had issues with bleeding previously or for which she was seen by [facility] gastroenterology back in 2019.  Records seem to report diverticulosis with bleeding as a possible cause, but patient reports they found no source.  Patient seems to state that they had previously told her not to be on blood thinners, but subsequently suffered a blood clot in 12/2018 that seem to be unprovoked for which patient was restarted on anticoagulation at that time.   ED Course: Upon admission into the emergency department patient was seen to be afebrile, respirations 16-27, and all other vital signs maintained.  Labs significant for hemoglobin 12.3.  Stool guaiacs were noted to be positive without reports of fissure or hemorrhoids on rectal exam.  Patient was typed and screened for the possible need of blood products.  CT scan of the abdomen pelvis was significant for diverticulosis without signs of diverticulitis and, fatty liver, and gallstones without complication.  GI had been consulted.  Patient had been given Protonix 40 mg IV.  Interval History:  Resting in bed comfortably this morning.  Did report having a small bowel movement with some mixed blood this morning.  ROS: Constitutional: negative for chills and fevers, Respiratory: negative for cough, Cardiovascular: negative for chest pain and syncope, and Gastrointestinal: negative for abdominal pain  Assessment & Plan: Suspected lower GIB - Patient presents with complaint of bright red blood per rectum.   - Hgb downtrend since admission (12.3 >> 10.6 g/dL) - continue trending H/H - start IVF - GI following as  well - etiology presumed diverticular but hopefully self-resolving at this time; if not may need bleeding scan if bleeding worsens or BP drops again    Essential hypertension:  -hold furosemide in setting of GIB   History of DVT/PE on chronic anticoagulation: Patient appeared to have been unprovoked DVT back in 12/2018 for which she was started on Eliquis. -Hold Eliquis; if further recurrent GIBs may need to address longevity of anticoagulation    History of GI bleed: Patient reports history of a GI bleed that appears possibly occurred back in 2019 for which patient was seen by [facility] gastroenterology.   Symptomatic bradycardia s/p pacemaker: Patient had a Ravia dual-chamber pacemaker placed on 01/11/2021 by Dr. Lovena Le. -Continue outpatient follow-up with cardiology   Diastolic dysfunction: Patient appears to be euvolemic at this time.  Last EF noted to be 65 to 70% with grade 1 diastolic dysfunction in 9/73/5329.   -Continue to monitor   Hypothyroidism -TSH: 2.627 -Continue levothyroxine   GERD -Pharmacy substitution of Protonix scheduled daily  Old records reviewed in assessment of this patient  Antimicrobials: N/a  DVT prophylaxis: SCDs Start: 04/12/21 1641   Code Status:   Code Status: Full Code Family Communication:   Disposition Plan: Status is: Observation  The patient will require care spanning > 2 midnights and should be moved to inpatient because: Ongoing diagnostic testing needed not appropriate for outpatient work up, Inpatient level of care appropriate due to severity of illness, and ongoing monitoring of Hgb still needed  Dispo: The patient is from: Home  Anticipated d/c is to: Home              Patient currently is not medically stable to d/c.   Difficult to place patient No   Risk of unplanned readmission score:     Objective: Blood pressure (!) 140/99, pulse 74, temperature 98.1 F (36.7 C), temperature source Oral, resp. rate 18,  height 5\' 7"  (1.702 m), weight 92.1 kg, SpO2 97 %.  Examination: General appearance: alert, cooperative, and no distress Head: Normocephalic, without obvious abnormality, atraumatic Eyes:  EOMI Lungs: clear to auscultation bilaterally Heart: regular rate and rhythm and S1, S2 normal Abdomen: normal findings: bowel sounds normal and soft, non-tender Extremities:  no edema Skin: mobility and turgor normal Neurologic: Grossly normal  Consultants:  GI  Procedures:    Data Reviewed: I have personally reviewed following labs and imaging studies Results for orders placed or performed during the hospital encounter of 04/12/21 (from the past 24 hour(s))  Hemoglobin and hematocrit, blood     Status: None   Collection Time: 04/12/21  5:28 PM  Result Value Ref Range   Hemoglobin 12.3 12.0 - 15.0 g/dL   HCT 38.0 36.0 - 46.0 %  Hemoglobin and hematocrit, blood     Status: Abnormal   Collection Time: 04/12/21 10:24 PM  Result Value Ref Range   Hemoglobin 11.8 (L) 12.0 - 15.0 g/dL   HCT 35.7 (L) 36.0 - 46.0 %  TSH     Status: None   Collection Time: 04/12/21 10:24 PM  Result Value Ref Range   TSH 2.627 0.350 - 4.500 uIU/mL  Basic metabolic panel     Status: Abnormal   Collection Time: 04/13/21  3:05 AM  Result Value Ref Range   Sodium 140 135 - 145 mmol/L   Potassium 3.8 3.5 - 5.1 mmol/L   Chloride 107 98 - 111 mmol/L   CO2 28 22 - 32 mmol/L   Glucose, Bld 111 (H) 70 - 99 mg/dL   BUN 11 8 - 23 mg/dL   Creatinine, Ser 0.70 0.44 - 1.00 mg/dL   Calcium 8.6 (L) 8.9 - 10.3 mg/dL   GFR, Estimated >60 >60 mL/min   Anion gap 5 5 - 15  CBC     Status: Abnormal   Collection Time: 04/13/21  3:06 AM  Result Value Ref Range   WBC 7.0 4.0 - 10.5 K/uL   RBC 3.77 (L) 3.87 - 5.11 MIL/uL   Hemoglobin 10.7 (L) 12.0 - 15.0 g/dL   HCT 32.1 (L) 36.0 - 46.0 %   MCV 85.1 80.0 - 100.0 fL   MCH 28.4 26.0 - 34.0 pg   MCHC 33.3 30.0 - 36.0 g/dL   RDW 14.7 11.5 - 15.5 %   Platelets 146 (L) 150 - 400  K/uL   nRBC 0.0 0.0 - 0.2 %  CBC     Status: Abnormal   Collection Time: 04/13/21  6:18 AM  Result Value Ref Range   WBC 5.5 4.0 - 10.5 K/uL   RBC 3.78 (L) 3.87 - 5.11 MIL/uL   Hemoglobin 10.5 (L) 12.0 - 15.0 g/dL   HCT 31.8 (L) 36.0 - 46.0 %   MCV 84.1 80.0 - 100.0 fL   MCH 27.8 26.0 - 34.0 pg   MCHC 33.0 30.0 - 36.0 g/dL   RDW 14.6 11.5 - 15.5 %   Platelets 136 (L) 150 - 400 K/uL   nRBC 0.0 0.0 - 0.2 %  CBC     Status: Abnormal  Collection Time: 04/13/21 12:04 PM  Result Value Ref Range   WBC 4.9 4.0 - 10.5 K/uL   RBC 3.84 (L) 3.87 - 5.11 MIL/uL   Hemoglobin 10.6 (L) 12.0 - 15.0 g/dL   HCT 32.7 (L) 36.0 - 46.0 %   MCV 85.2 80.0 - 100.0 fL   MCH 27.6 26.0 - 34.0 pg   MCHC 32.4 30.0 - 36.0 g/dL   RDW 14.8 11.5 - 15.5 %   Platelets 145 (L) 150 - 400 K/uL   nRBC 0.0 0.0 - 0.2 %    Recent Results (from the past 240 hour(s))  Resp Panel by RT-PCR (Flu A&B, Covid) Nasopharyngeal Swab     Status: None   Collection Time: 04/12/21  1:02 PM   Specimen: Nasopharyngeal Swab; Nasopharyngeal(NP) swabs in vial transport medium  Result Value Ref Range Status   SARS Coronavirus 2 by RT PCR NEGATIVE NEGATIVE Final    Comment: (NOTE) SARS-CoV-2 target nucleic acids are NOT DETECTED.  The SARS-CoV-2 RNA is generally detectable in upper respiratory specimens during the acute phase of infection. The lowest concentration of SARS-CoV-2 viral copies this assay can detect is 138 copies/mL. A negative result does not preclude SARS-Cov-2 infection and should not be used as the sole basis for treatment or other patient management decisions. A negative result may occur with  improper specimen collection/handling, submission of specimen other than nasopharyngeal swab, presence of viral mutation(s) within the areas targeted by this assay, and inadequate number of viral copies(<138 copies/mL). A negative result must be combined with clinical observations, patient history, and  epidemiological information. The expected result is Negative.  Fact Sheet for Patients:  EntrepreneurPulse.com.au  Fact Sheet for Healthcare Providers:  IncredibleEmployment.be  This test is no t yet approved or cleared by the [place] FDA and  has been authorized for detection and/or diagnosis of SARS-CoV-2 by FDA under an Emergency Use Authorization (EUA). This EUA will remain  in effect (meaning this test can be used) for the duration of the COVID-19 declaration under Section 564(b)(1) of the Act, 21 U.S.C.section 360bbb-3(b)(1), unless the authorization is terminated  or revoked sooner.       Influenza A by PCR NEGATIVE NEGATIVE Final   Influenza B by PCR NEGATIVE NEGATIVE Final    Comment: (NOTE) The Xpert Xpress SARS-CoV-2/FLU/RSV plus assay is intended as an aid in the diagnosis of influenza from Nasopharyngeal swab specimens and should not be used as a sole basis for treatment. Nasal washings and aspirates are unacceptable for Xpert Xpress SARS-CoV-2/FLU/RSV testing.  Fact Sheet for Patients: EntrepreneurPulse.com.au  Fact Sheet for Healthcare Providers: IncredibleEmployment.be  This test is not yet approved or cleared by the [place] FDA and has been authorized for detection and/or diagnosis of SARS-CoV-2 by FDA under an Emergency Use Authorization (EUA). This EUA will remain in effect (meaning this test can be used) for the duration of the COVID-19 declaration under Section 564(b)(1) of the Act, 21 U.S.C. section 360bbb-3(b)(1), unless the authorization is terminated or revoked.  Performed at [facility], [place] [address], [place], [place] [address]      Radiology Studies: CT ABDOMEN PELVIS W CONTRAST  Result Date: 04/12/2021 CLINICAL DATA:  Left-sided abdominal pain EXAM: CT ABDOMEN AND PELVIS WITH CONTRAST TECHNIQUE: Multidetector CT imaging of the abdomen and pelvis was  performed using the standard protocol following bolus administration of intravenous contrast. CONTRAST:  151mL OMNIPAQUE IOHEXOL 300 MG/ML  SOLN COMPARISON:  12/24/2020 FINDINGS: Lower chest: Mild scarring is noted in  the bases bilaterally. Calcified granuloma is noted in the left lung base. Hepatobiliary: Fatty infiltration of the liver is noted. The gallbladder is well distended with stable appearing small layering stones similar to that noted on prior exam. Pancreas: Unremarkable. No pancreatic ductal dilatation or surrounding inflammatory changes. Spleen: Normal in size without focal abnormality. Adrenals/Urinary Tract: Adrenal glands are within normal limits. Renal cystic changes noted bilaterally. These changes are stable from the prior study. No renal calculi or obstructive changes are noted. Bladder is well distended. Stomach/Bowel: Diverticular change of the colon is noted without evidence of diverticulitis. The appendix is not well visualized although no inflammatory changes are noted to suggest appendicitis. Small bowel and stomach appear within normal limits. Vascular/Lymphatic: Aortic atherosclerosis. No enlarged abdominal or pelvic lymph nodes. Reproductive: Status post hysterectomy. No adnexal masses. Other: No abdominal wall hernia or abnormality. No abdominopelvic ascites. Musculoskeletal: Degenerative changes of the hip joints and lumbar spine are noted. Chronic T12 compression deformity is noted. This has progressed slightly in the interval from the prior exam. IMPRESSION: Diverticulosis without diverticulitis. Fatty liver. Gallstones without complicating factors. Chronic changes similar to that seen on the prior exam. Electronically Signed   By: Inez Catalina M.D.   On: 04/12/2021 15:13   CUP PACEART REMOTE DEVICE CHECK  Result Date: 04/13/2021 Scheduled remote reviewed. Normal device function.  19 AMS longest 1 min. Burden <1%. EGMs appear to AT. V response 70s. Next remote 91 days. LH  CT  ABDOMEN PELVIS W CONTRAST  Final Result      Scheduled Meds:  levothyroxine  75 mcg Oral QAC breakfast   pantoprazole  40 mg Oral Daily   pregabalin  75 mg Oral BID   sodium chloride flush  3 mL Intravenous Q12H   PRN Meds: acetaminophen **OR** acetaminophen, albuterol, meclizine, mirtazapine, ondansetron **OR** ondansetron (ZOFRAN) IV, polyvinyl alcohol, sodium chloride, traMADol Continuous Infusions:   LOS: 0 days  Time spent: Greater than 50% of the 35 minute visit was spent in counseling/coordination of care for the patient as laid out in the A&P.   Dwyane Dee, MD [facility] 04/13/2021, 2:14 PM

## 2021-04-13 NOTE — Progress Notes (Signed)
Cross-coverage note:   Patient was seen for rectal bleeding with near-syncope.   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   She now had another episode of bright red blood and clots with cramping lower abdominal discomfort, lightheadedness, and transient drop in BP to 80/49.   She appears comfortable, abdomen is soft, SBP improved to low 100s.   Plan to give IVF bolus, repeat CBC, change bed to stepdown, continue close monitoring. Discussed plan with patient, her daughter, and Therapist, sports.

## 2021-04-13 NOTE — Plan of Care (Signed)

## 2021-04-13 NOTE — Care Management Obs Status (Signed)
[facility] NOTIFICATION   Patient Details  Name: [name] MRN: [id number] Date of Birth: 10-Jun-1927   Medicare Observation Status Notification Given:  Yes    Joanne Chars, [place] 04/13/2021, 1:03 PM

## 2021-04-13 NOTE — Consult Note (Addendum)
Consultation  Referring Provider:  [facility]/ [facility] Primary Care Physician:  [name], DO Primary Gastroenterologist:  none.  Reason for Consultation:  rectal bleeding   HPI: [name] is a 87 y.o. female, who was admitted yesterday after acute onset of rectal bleeding with what was described as dark stool, red blood and some clots.  She had 1 large episode at home, and has had at least 2 or 3 further episodes since admission though she says not as much volume. She had noticed that her stool was a bit darker this past weekend but did not notice any blood.  She says she was not feeling particularly well this past weekend, stomach does not feel right "" and she seemed to feel better with food on her stomach though did not actually have nausea or any vomiting.  She denies any abdominal pain.  She is having sense of unsettled feeling in her abdomen now and very mild left lower quadrant discomfort. She is also reporting seeing blood in her urine over the past 2 days which she has never had previously. Hemoglobin on arrival 12.3/platelets 160, serial hemoglobins 11.8 then 10.7 this morning.  She has remained hemodynamically stable.  She is on Eliquis with previous history of what was felt to be an unprovoked DVT/PE about 3 years ago. Also with history of hypertension, she status post pacemaker placement in March 2022 has hypothyroidism. She has had prior GI evaluation as recently as 2019 in [place] where she was living previously.  She says that she believes that was done because of bleeding that she was having at that time.  She does not know the results of the exam.  Results not available currently in care everywhere, trying to retrieve. Patient had CT of the abdomen and pelvis last evening with contrast that showed fatty liver, small gallstones and diverticulosis no evidence of diverticulitis  Other labs, COVID screen negative BUN 7/creatinine 0.8, LFTs within normal limits UA with 0-5 RBC  0-5 WBCs no bacteria   Past Medical History:  Diagnosis Date   Central retinal artery occlusion of right eye 2015   lost complete vision   Edema    Hypertension    not on BP medication currently   Neuropathy    Post-surgical hypothyroidism    Pulmonary emboli (HCC)    VTE (venous thromboembolism) 12/2018   LLE DVT and PE    Past Surgical History:  Procedure Laterality Date   COLONOSCOPY  2019   PACEMAKER IMPLANT N/A 01/11/2021   Procedure: PACEMAKER IMPLANT;  Surgeon: Evans Lance, MD;  Location: [facility];  Service: Cardiovascular;  Laterality: N/A;   REPLACEMENT TOTAL KNEE Right 2008   TOTAL THYROIDECTOMY      Prior to Admission medications   Medication Sig Start Date End Date Taking? Authorizing Provider  ELIQUIS 5 MG TABS tablet Take 1 tablet (5 mg total) by mouth 2 (two) times daily. 01/16/21  Yes Shirley Friar, PA-C  furosemide (LASIX) 20 MG tablet Take 20 mg by mouth daily.   Yes [provider]  levothyroxine (SYNTHROID) 75 MCG tablet Take 75 mcg by mouth daily before breakfast.   Yes [provider]  magnesium hydroxide (MILK OF MAGNESIA) 400 MG/5ML suspension Take 15-30 mLs by mouth daily as needed for mild constipation.   Yes [provider]  meclizine (ANTIVERT) 25 MG tablet Take 25 mg by mouth as needed for dizziness.   Yes [provider]  mirtazapine (REMERON) 15 MG  tablet Take 15 mg by mouth at bedtime as needed (depression).   Yes [provider]  NON FORMULARY Take 30-60 mLs by mouth See admin instructions. Prune juice- Drink 30-60 ml's by mouth as needed for constipation   Yes [provider]  omeprazole (PRILOSEC) 20 MG capsule Take 20 mg by mouth as needed (acid reflux).   Yes [provider]  polyvinyl alcohol (LIQUIFILM TEARS) 1.4 % ophthalmic solution Place 1 drop into both eyes as needed for dry eyes. 10/05/20  Yes Mercy Riding, MD  pregabalin (LYRICA) 75 MG capsule Take  1 capsule (75 mg total) by mouth 2 (two) times daily. 10/05/20  Yes Mercy Riding, MD  sodium chloride (OCEAN) 0.65 % SOLN nasal spray Place 1 spray into both nostrils as needed for congestion.   Yes [provider]  traMADol (ULTRAM) 50 MG tablet Take 50 mg by mouth as needed for moderate pain or severe pain.   Yes [provider]  hydrochlorothiazide (MICROZIDE) 12.5 MG capsule Take 1 capsule (12.5 mg total) by mouth daily. Patient not taking: No sig reported 10/05/20 04/12/21  Mercy Riding, MD    Current Facility-Administered Medications  Medication Dose Route Frequency Provider Last Rate Last Admin   0.9 %  sodium chloride infusion   Intravenous Continuous Dwyane Dee, MD 75 mL/hr at 04/13/21 1525 New Bag at 04/13/21 1525   acetaminophen (TYLENOL) tablet 650 mg  650 mg Oral Q6H PRN Norval Morton, MD       Or   acetaminophen (TYLENOL) suppository 650 mg  650 mg Rectal Q6H PRN Fuller Plan A, MD       albuterol (PROVENTIL) (2.5 MG/3ML) 0.083% nebulizer solution 2.5 mg  2.5 mg Nebulization Q6H PRN Norval Morton, MD       levothyroxine (SYNTHROID) tablet 75 mcg  75 mcg Oral QAC breakfast Fuller Plan A, MD   75 mcg at 04/13/21 0631   meclizine (ANTIVERT) tablet 25 mg  25 mg Oral PRN Fuller Plan A, MD       mirtazapine (REMERON) tablet 15 mg  15 mg Oral QHS PRN Fuller Plan A, MD   15 mg at 04/12/21 2216   ondansetron (ZOFRAN) tablet 4 mg  4 mg Oral Q6H PRN Fuller Plan A, MD       Or   ondansetron (ZOFRAN) injection 4 mg  4 mg Intravenous Q6H PRN Smith, Rondell A, MD       pantoprazole (PROTONIX) EC tablet 40 mg  40 mg Oral Daily Smith, Rondell A, MD   40 mg at 04/13/21 0746   polyvinyl alcohol (LIQUIFILM TEARS) 1.4 % ophthalmic solution 1 drop  1 drop Both Eyes PRN Tamala Julian, Rondell A, MD       pregabalin (LYRICA) capsule 75 mg  75 mg Oral BID Tamala Julian, Rondell A, MD   75 mg at 04/13/21 0746   sodium chloride (OCEAN) 0.65 % nasal spray 1 spray  1 spray Each  Nare PRN Smith, Rondell A, MD       sodium chloride flush (NS) 0.9 % injection 3 mL  3 mL Intravenous Q12H Smith, Rondell A, MD   3 mL at 04/13/21 0751   traMADol (ULTRAM) tablet 50 mg  50 mg Oral PRN Norval Morton, MD        Allergies as of 04/12/2021 - Review Complete 04/12/2021  Allergen Reaction Noted   Codeine Other (See Comments) 09/12/2020    No family history on file.  Social History  Socioeconomic History   Marital status: Widowed    Spouse name: Not on file   Number of children: Not on file   Years of education: Not on file   Highest education level: Not on file  Occupational History   Occupation: retired  Tobacco Use   Smoking status: Never   Smokeless tobacco: Never  Substance and Sexual Activity   Alcohol use: Never   Drug use: Never   Sexual activity: Not on file  Other Topics Concern   Not on file  Social History Narrative   ** Merged History Encounter **       Social Determinants of Health   Financial Resource Strain: Not on file  Food Insecurity: Not on file  Transportation Needs: Not on file  Physical Activity: Not on file  Stress: Not on file  Social Connections: Not on file  Intimate Partner Violence: Not on file    Review of Systems: Gen: Denies any fever, chills, sweats, anorexia, fatigue, weakness, malaise, weight loss, and sleep disorder CV: Denies chest pain, angina, palpitations, syncope, orthopnea, PND, peripheral edema, and claudication. Resp: Denies dyspnea at rest, dyspnea with exercise, cough, sputum, wheezing, coughing up blood, and pleurisy. GI: Denies vomiting blood, jaundice, and fecal incontinence.   Denies dysphagia or odynophagia. GU : Denies urinary burning, blood in urine, urinary frequency, urinary hesitancy, nocturnal urination, and urinary incontinence. MS: Denies joint pain, limitation of movement, and swelling, stiffness, low back pain, extremity pain. Denies muscle weakness, cramps, atrophy.  Derm: Denies rash,  itching, dry skin, hives, moles, warts, or unhealing ulcers.  Psych: Denies depression, anxiety, memory loss, suicidal ideation, hallucinations, paranoia, and confusion. Heme: Denies bruising, bleeding, and enlarged lymph nodes. Neuro:  Denies any headaches, dizziness, paresthesias. Endo:  Denies any problems with DM, thyroid, adrenal function.  Physical Exam: Vital signs in last 24 hours: Temp:  [97.8 F (36.6 C)-98.1 F (36.7 C)] 98.1 F (36.7 C) (06/21 1130) Pulse Rate:  [69-96] 81 (06/21 1600) Resp:  [15-35] 20 (06/21 1600) BP: (80-170)/(49-99) 131/55 (06/21 1600) SpO2:  [91 %-99 %] 98 % (06/21 1600) Last BM Date: 04/12/21 General:   Alert,  Well-developed, well-nourished, very elderly African-American female pleasant and cooperative in NAD Head:  Normocephalic and atraumatic. Eyes:  Sclera clear, no icterus.   Conjunctiva pink. Ears:  Normal auditory acuity. Nose:  No deformity, discharge,  or lesions. Mouth:  No deformity or lesions.   Neck:  Supple; no masses or thyromegaly. Lungs:  Clear throughout to auscultation.   No wheezes, crackles, or rhonchi. Heart:  Regular rate and rhythm; no murmurs, clicks, rubs,  or gallops. Abdomen:  Soft, mildly tender in the suprapubic area and left lower quadrant ,BS active,nonpalp mass or hsm.   Rectal: Not done Msk:  Symmetrical without gross deformities. . Pulses:  Normal pulses noted. Extremities:  Without clubbing or edema. Neurologic:  Alert and  oriented x4;  grossly normal neurologically. Skin:  Intact without significant lesions or rashes.. Psych:  Alert and cooperative. Normal mood and affect.  Intake/Output from previous day: 06/20 0701 - 06/21 0700 In: 2000.4 [I.V.:1000; IV Piggyback:1000.4] Out: -  Intake/Output this shift: Total I/O In: 120 [P.O.:120] Out: -   Lab Results: Recent Labs    04/13/21 0306 04/13/21 0618 04/13/21 1204  WBC 7.0 5.5 4.9  HGB 10.7* 10.5* 10.6*  HCT 32.1* 31.8* 32.7*  PLT 146* 136* 145*    BMET Recent Labs    04/12/21 1200 04/13/21 0305  NA 139 140  K 4.1  3.8  CL 105 107  CO2 29 28  GLUCOSE 100* 111*  BUN 7* 11  CREATININE 0.81 0.70  CALCIUM 9.2 8.6*   LFT Recent Labs    04/12/21 1200  PROT 6.9  ALBUMIN 3.3*  AST 19  ALT 11  ALKPHOS 80  BILITOT 0.7      IMPRESSION:   #67 87 year old female admitted with acute lower GI bleed, very likely diverticular in origin.  This is occurred in the setting of chronic Eliquis  She has been hemodynamically stable but has had 1.5 g drop in hemoglobin since admission, no transfusion requirement  CT of the abdomen and pelvis on admit with contrast does confirm diverticulosis, no evidence of diverticulitis or other intra-abdominal pathology.  Patient had previous colonoscopy in 2019 done in [place] at which time she was also having bleeding.(We will retrieve via Care Everywhere)  #2  mild anemia secondary to above #3 history of prior nonprovoked DVT/PE #4 hypertension #5 status post pacemaker   Plan; Supportive management for now Clear liquid diet today Continue serial hemoglobins every 6 hours and transfuse for hemoglobin less than 8 given advanced age Hold Eliquis as doing this  last dose 04/11/2021 Do not plan endoscopic intervention at this time  Will follow along with you     [facility] PA-C6/21/2022, 4:37 PM  I have seen and evaluated the patient in person as well.  Working diagnosis is that of a colonic diverticular hemorrhage in the setting of Eliquis therapy.  We are trying to get a look at her previous colonoscopy.  It will not download on care everywhere though there is a link to [facility] gastroenterology.  Supportive care at this point hold Eliquis we will follow.  Gatha Mayer, MD, [facility] 04/13/2021 4:38 PM

## 2021-04-14 ENCOUNTER — Encounter: Payer: Medicare Other | Admitting: Internal Medicine

## 2021-04-14 ENCOUNTER — Observation Stay (HOSPITAL_BASED_OUTPATIENT_CLINIC_OR_DEPARTMENT_OTHER): Payer: Medicare Other

## 2021-04-14 DIAGNOSIS — K922 Gastrointestinal hemorrhage, unspecified: Secondary | ICD-10-CM | POA: Diagnosis not present

## 2021-04-14 DIAGNOSIS — M79605 Pain in left leg: Secondary | ICD-10-CM | POA: Diagnosis not present

## 2021-04-14 LAB — CBC
HCT: 29.7 % — ABNORMAL LOW (ref 36.0–46.0)
HCT: 30.8 % — ABNORMAL LOW (ref 36.0–46.0)
HCT: 34 % — ABNORMAL LOW (ref 36.0–46.0)
Hemoglobin: 9.9 g/dL — ABNORMAL LOW (ref 12.0–15.0)
Hemoglobin: 9.9 g/dL — ABNORMAL LOW (ref 12.0–15.0)
Hemoglobin: 11.1 g/dL — ABNORMAL LOW (ref 12.0–15.0)
MCH: 28.2 pg (ref 26.0–34.0)
MCH: 27.5 pg (ref 26.0–34.0)
MCH: 27.9 pg (ref 26.0–34.0)
MCHC: 33.3 g/dL (ref 30.0–36.0)
MCHC: 32.1 g/dL (ref 30.0–36.0)
MCHC: 32.6 g/dL (ref 30.0–36.0)
MCV: 84.6 fL (ref 80.0–100.0)
MCV: 85.6 fL (ref 80.0–100.0)
MCV: 85.4 fL (ref 80.0–100.0)
Platelets: 135 10*3/uL — ABNORMAL LOW (ref 150–400)
Platelets: 138 10*3/uL — ABNORMAL LOW (ref 150–400)
Platelets: 155 10*3/uL (ref 150–400)
RBC: 3.51 MIL/uL — ABNORMAL LOW (ref 3.87–5.11)
RBC: 3.6 MIL/uL — ABNORMAL LOW (ref 3.87–5.11)
RBC: 3.98 MIL/uL (ref 3.87–5.11)
RDW: 14.9 % (ref 11.5–15.5)
RDW: 14.9 % (ref 11.5–15.5)
RDW: 14.7 % (ref 11.5–15.5)
WBC: 4 10*3/uL (ref 4.0–10.5)
WBC: 3.9 10*3/uL — ABNORMAL LOW (ref 4.0–10.5)
WBC: 4.4 10*3/uL (ref 4.0–10.5)
nRBC: 0 % (ref 0.0–0.2)
nRBC: 0 % (ref 0.0–0.2)
nRBC: 0 % (ref 0.0–0.2)

## 2021-04-14 NOTE — Progress Notes (Signed)
Progress Note    [name]   SJG:[id number]  DOB: 1926/11/08  DOA: 04/12/2021     0  PCP: [name], DO  CC: rectal bleeding  Hospital Course: Ms. 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   Patient reports that she had issues with bleeding previously or for which she was seen by [facility] gastroenterology back in 2019.  Records seem to report diverticulosis with bleeding as a possible cause, but patient reports they found no source.  Patient seems to state that they had previously told her not to be on blood thinners, but subsequently suffered a blood clot in 12/2018 that seem to be unprovoked for which patient was restarted on anticoagulation at that time.   ED Course: Upon admission into the emergency department patient was seen to be afebrile, respirations 16-27, and all other vital signs maintained.  Labs significant for hemoglobin 12.3.  Stool guaiacs were noted to be positive without reports of fissure or hemorrhoids on rectal exam.  Patient was typed and screened for the possible need of blood products.  CT scan of the abdomen pelvis was significant for diverticulosis without signs of diverticulitis and, fatty liver, and gallstones without complication.  GI had been consulted.  Patient had been given Protonix 40 mg IV.  Interval History:  No more bloody stool.  No abdominal pain.  Hgb stable.  No dyspnea.  GI advanced pt's diet.   ROS: Constitutional: negative for chills and fevers, Respiratory: negative for cough, Cardiovascular: negative for chest pain and syncope, and Gastrointestinal: negative for abdominal pain  Assessment & Plan: Suspected diverticular bleed Acute blood loss anemia - Patient presents with complaint of bright red blood per rectum.   - Hgb downtrended (12.3 >> 9's), then improved to 11's today Plan: --advance diet to solid, per  GI --No GI procedure planned --d/c MIVF --cont to hold Eliquis --monitor 1 more night, per GI  Essential hypertension:  --Hold home lasix for now   History of DVT/PE on chronic anticoagulation:  Patient appeared to have been unprovoked DVT back in 12/2018 for which she was started on Eliquis. --DVT studies ordered due to complaint of leg pain, neg for DVT in BLE. Plan: --cont to hold Eliquis   History of GI bleed:  Patient reported history of a GI bleed that appears possibly occurred back in 2019 for which patient was seen by [facility] gastroenterology.   Symptomatic bradycardia s/p pacemaker:  Patient had a [employer] Jude dual-chamber pacemaker placed on 01/11/2021 by Dr. Lovena Le. -no issues   Diastolic dysfunction:  Patient appears to be euvolemic at this time.  Last EF noted to be 65 to 70% with grade 1 diastolic dysfunction in 6/54/6503.   --d/c MIVF today --hold lasix for now   Hypothyroidism -TSH: 2.627 -Continue levothyroxine   GERD --cont protonix   Antimicrobials: N/a  DVT prophylaxis: SCDs Start: 04/12/21 1641   Code Status:   Code Status: Full Code Family Communication: daughter updated at bedside today  Disposition Plan: Status is: Observation  Dispo: The patient is from: Home              Anticipated d/c is to: Home              Patient currently is not medically stable to d/c.  GI rec monitoring Hgb for 1 more night.    Difficult to place patient No   Risk of unplanned  readmission score:     Objective: Blood pressure (!) 145/64, pulse 77, temperature 98.1 F (36.7 C), temperature source Oral, resp. rate 20, height 5\' 7"  (1.702 m), weight 92.1 kg, SpO2 99 %.   Examination: Constitutional: NAD, AAOx3 HEENT: conjunctivae and lids normal, EOMI CV: No cyanosis.   RESP: normal respiratory effort, on RA Extremities: No effusions, edema in BLE SKIN: warm, dry Neuro: II - XII grossly intact.   Psych: Normal mood and affect.  Appropriate judgement and  reason   Consultants:  GI  Procedures:    Data Reviewed: I have personally reviewed following labs and imaging studies Results for orders placed or performed during the hospital encounter of 04/12/21 (from the past 24 hour(s))  CBC     Status: Abnormal   Collection Time: 04/13/21  6:03 PM  Result Value Ref Range   WBC 5.2 4.0 - 10.5 K/uL   RBC 3.95 3.87 - 5.11 MIL/uL   Hemoglobin 10.9 (L) 12.0 - 15.0 g/dL   HCT 33.6 (L) 36.0 - 46.0 %   MCV 85.1 80.0 - 100.0 fL   MCH 27.6 26.0 - 34.0 pg   MCHC 32.4 30.0 - 36.0 g/dL   RDW 14.7 11.5 - 15.5 %   Platelets 146 (L) 150 - 400 K/uL   nRBC 0.0 0.0 - 0.2 %  CBC     Status: Abnormal   Collection Time: 04/14/21 12:30 AM  Result Value Ref Range   WBC 4.0 4.0 - 10.5 K/uL   RBC 3.51 (L) 3.87 - 5.11 MIL/uL   Hemoglobin 9.9 (L) 12.0 - 15.0 g/dL   HCT 29.7 (L) 36.0 - 46.0 %   MCV 84.6 80.0 - 100.0 fL   MCH 28.2 26.0 - 34.0 pg   MCHC 33.3 30.0 - 36.0 g/dL   RDW 14.9 11.5 - 15.5 %   Platelets 135 (L) 150 - 400 K/uL   nRBC 0.0 0.0 - 0.2 %  CBC     Status: Abnormal   Collection Time: 04/14/21  5:55 AM  Result Value Ref Range   WBC 3.9 (L) 4.0 - 10.5 K/uL   RBC 3.60 (L) 3.87 - 5.11 MIL/uL   Hemoglobin 9.9 (L) 12.0 - 15.0 g/dL   HCT 30.8 (L) 36.0 - 46.0 %   MCV 85.6 80.0 - 100.0 fL   MCH 27.5 26.0 - 34.0 pg   MCHC 32.1 30.0 - 36.0 g/dL   RDW 14.9 11.5 - 15.5 %   Platelets 138 (L) 150 - 400 K/uL   nRBC 0.0 0.0 - 0.2 %  CBC     Status: Abnormal   Collection Time: 04/14/21 12:08 PM  Result Value Ref Range   WBC 4.4 4.0 - 10.5 K/uL   RBC 3.98 3.87 - 5.11 MIL/uL   Hemoglobin 11.1 (L) 12.0 - 15.0 g/dL   HCT 34.0 (L) 36.0 - 46.0 %   MCV 85.4 80.0 - 100.0 fL   MCH 27.9 26.0 - 34.0 pg   MCHC 32.6 30.0 - 36.0 g/dL   RDW 14.7 11.5 - 15.5 %   Platelets 155 150 - 400 K/uL   nRBC 0.0 0.0 - 0.2 %    Recent Results (from the past 240 hour(s))  Resp Panel by RT-PCR (Flu A&B, Covid) Nasopharyngeal Swab     Status: None   Collection Time:  04/12/21  1:02 PM   Specimen: Nasopharyngeal Swab; Nasopharyngeal(NP) swabs in vial transport medium  Result Value Ref Range Status   SARS Coronavirus 2 by RT PCR  NEGATIVE NEGATIVE Final    Comment: (NOTE) SARS-CoV-2 target nucleic acids are NOT DETECTED.  The SARS-CoV-2 RNA is generally detectable in upper respiratory specimens during the acute phase of infection. The lowest concentration of SARS-CoV-2 viral copies this assay can detect is 138 copies/mL. A negative result does not preclude SARS-Cov-2 infection and should not be used as the sole basis for treatment or other patient management decisions. A negative result may occur with  improper specimen collection/handling, submission of specimen other than nasopharyngeal swab, presence of viral mutation(s) within the areas targeted by this assay, and inadequate number of viral copies(<138 copies/mL). A negative result must be combined with clinical observations, patient history, and epidemiological information. The expected result is Negative.  Fact Sheet for Patients:  EntrepreneurPulse.com.au  Fact Sheet for Healthcare Providers:  IncredibleEmployment.be  This test is no t yet approved or cleared by the [place] FDA and  has been authorized for detection and/or diagnosis of SARS-CoV-2 by FDA under an Emergency Use Authorization (EUA). This EUA will remain  in effect (meaning this test can be used) for the duration of the COVID-19 declaration under Section 564(b)(1) of the Act, 21 U.S.C.section 360bbb-3(b)(1), unless the authorization is terminated  or revoked sooner.       Influenza A by PCR NEGATIVE NEGATIVE Final   Influenza B by PCR NEGATIVE NEGATIVE Final    Comment: (NOTE) The Xpert Xpress SARS-CoV-2/FLU/RSV plus assay is intended as an aid in the diagnosis of influenza from Nasopharyngeal swab specimens and should not be used as a sole basis for treatment. Nasal washings  and aspirates are unacceptable for Xpert Xpress SARS-CoV-2/FLU/RSV testing.  Fact Sheet for Patients: EntrepreneurPulse.com.au  Fact Sheet for Healthcare Providers: IncredibleEmployment.be  This test is not yet approved or cleared by the [place] FDA and has been authorized for detection and/or diagnosis of SARS-CoV-2 by FDA under an Emergency Use Authorization (EUA). This EUA will remain in effect (meaning this test can be used) for the duration of the COVID-19 declaration under Section 564(b)(1) of the Act, 21 U.S.C. section 360bbb-3(b)(1), unless the authorization is terminated or revoked.  Performed at [facility], [place] [address] [place], [place] [address]      Radiology Studies: VAS [place] LOWER EXTREMITY VENOUS (DVT)  Result Date: 04/14/2021  Lower Venous DVT Study Patient Name:  DOMINICK ZERTUCHE  Date of Exam:   04/14/2021 Medical Rec #: [id number]       Accession #:    [id number] Date of Birth: 1927-01-28       Patient Gender: F Patient Age:   89Y Exam Location:  [facility] Procedure:      VAS [place] LOWER EXTREMITY VENOUS (DVT) Referring Phys: [id number] TIMOTHY S OPYD --------------------------------------------------------------------------------  Indications: Pain.  Comparison Study: 01/12/21 prior Performing Technologist: Archie Patten RVS  Examination Guidelines: A complete evaluation includes B-mode imaging, spectral Doppler, color Doppler, and power Doppler as needed of all accessible portions of each vessel. Bilateral testing is considered an integral part of a complete examination. Limited examinations for reoccurring indications may be performed as noted. The reflux portion of the exam is performed with the patient in reverse Trendelenburg.  +-----+---------------+---------+-----------+----------+--------------+ RIGHTCompressibilityPhasicitySpontaneityPropertiesThrombus Aging  +-----+---------------+---------+-----------+----------+--------------+ CFV  Full           Yes      Yes                                 +-----+---------------+---------+-----------+----------+--------------+   +---------+---------------+---------+-----------+----------+--------------+  LEFT     CompressibilityPhasicitySpontaneityPropertiesThrombus Aging +---------+---------------+---------+-----------+----------+--------------+ CFV      Full           Yes      Yes                                 +---------+---------------+---------+-----------+----------+--------------+ SFJ      Full                                                        +---------+---------------+---------+-----------+----------+--------------+ FV Prox  Full                                                        +---------+---------------+---------+-----------+----------+--------------+ FV Mid                  Yes      Yes                                 +---------+---------------+---------+-----------+----------+--------------+ FV Distal               Yes      Yes                                 +---------+---------------+---------+-----------+----------+--------------+ PFV      Full                                                        +---------+---------------+---------+-----------+----------+--------------+ POP      Full           Yes      Yes                                 +---------+---------------+---------+-----------+----------+--------------+ PTV      Full                                                        +---------+---------------+---------+-----------+----------+--------------+ PERO     Full                                                        +---------+---------------+---------+-----------+----------+--------------+     Summary: RIGHT: - No evidence of common femoral vein obstruction.  LEFT: - There is no evidence of deep vein thrombosis in the  lower extremity.  - No cystic structure found in the popliteal fossa.  *See table(s) above for measurements and observations.    Preliminary  VAS [place] LOWER EXTREMITY VENOUS (DVT)      CT ABDOMEN PELVIS W CONTRAST  Final Result      Scheduled Meds:  levothyroxine  75 mcg Oral QAC breakfast   pantoprazole  40 mg Oral Daily   pregabalin  75 mg Oral BID   sodium chloride flush  3 mL Intravenous Q12H   PRN Meds: acetaminophen **OR** acetaminophen, albuterol, meclizine, mirtazapine, ondansetron **OR** ondansetron (ZOFRAN) IV, polyvinyl alcohol, sodium chloride, traMADol Continuous Infusions:   LOS: 0 days    Enzo Bi, MD [facility] 04/14/2021, 5:14 PM

## 2021-04-14 NOTE — Progress Notes (Signed)
Patient ID: [name], female   DOB: 08-13-1927, 87 y.o.   MRN: [id number]    Progress Note   Subjective   Day # 2 CC; acute lower GI bleed in setting of Eliquis  Left lower extremity venous Doppler pending  Hemoglobin 9.9/hematocrit 30.8-down from 10.9 yesterday.  Patient sitting up in the chair, says she feels a little better today, she has not had any bowel movement since yesterday, no complaints of abdominal pain She has been complaining of some pain in the medial aspect of the left calf up into her knee since yesterday.  She is hungry and wondering when she can have some real food      Objective   Vital signs in last 24 hours: Temp:  [97.8 F (36.6 C)-98.1 F (36.7 C)] 98 F (36.7 C) (06/22 0421) Pulse Rate:  [70-85] 70 (06/22 0730) Resp:  [16-20] 18 (06/22 0730) BP: (113-144)/(55-99) 143/57 (06/22 0730) SpO2:  [97 %-99 %] 99 % (06/22 0730) Last BM Date: 04/13/21 General: Very elderly African-American female female in NAD Heart:  Regular rate and rhythm; no murmurs Lungs: Respirations even and unlabored, lungs CTA bilaterally Abdomen:  Soft, nontender and nondistended. Normal bowel sounds. Extremities: 1+ edema bilateral ankles, she has some tenderness in the medial aspect of her left calf Neurologic:  Alert and oriented,  grossly normal neurologically. Psych:  Cooperative. Normal mood and affect.  Intake/Output from previous day: 06/21 0701 - 06/22 0700 In: 1294.8 [P.O.:240; I.V.:1054.8] Out: 800 [Urine:800] Intake/Output this shift: No intake/output data recorded.  Lab Results: Recent Labs    04/13/21 1803 04/14/21 0030 04/14/21 0555  WBC 5.2 4.0 3.9*  HGB 10.9* 9.9* 9.9*  HCT 33.6* 29.7* 30.8*  PLT 146* 135* 138*   BMET Recent Labs    04/12/21 1200 04/13/21 0305  NA 139 140  K 4.1 3.8  CL 105 107  CO2 29 28  GLUCOSE 100* 111*  BUN 7* 11  CREATININE 0.81 0.70  CALCIUM 9.2 8.6*   LFT Recent Labs    04/12/21 1200  PROT 6.9  ALBUMIN  3.3*  AST 19  ALT 11  ALKPHOS 80  BILITOT 0.7   PT/INR No results for input(s): LABPROT, INR in the last 72 hours.  Studies/Results: CT ABDOMEN PELVIS W CONTRAST  Result Date: 04/12/2021 CLINICAL DATA:  Left-sided abdominal pain EXAM: CT ABDOMEN AND PELVIS WITH CONTRAST TECHNIQUE: Multidetector CT imaging of the abdomen and pelvis was performed using the standard protocol following bolus administration of intravenous contrast. CONTRAST:  151mL OMNIPAQUE IOHEXOL 300 MG/ML  SOLN COMPARISON:  12/24/2020 FINDINGS: Lower chest: Mild scarring is noted in the bases bilaterally. Calcified granuloma is noted in the left lung base. Hepatobiliary: Fatty infiltration of the liver is noted. The gallbladder is well distended with stable appearing small layering stones similar to that noted on prior exam. Pancreas: Unremarkable. No pancreatic ductal dilatation or surrounding inflammatory changes. Spleen: Normal in size without focal abnormality. Adrenals/Urinary Tract: Adrenal glands are within normal limits. Renal cystic changes noted bilaterally. These changes are stable from the prior study. No renal calculi or obstructive changes are noted. Bladder is well distended. Stomach/Bowel: Diverticular change of the colon is noted without evidence of diverticulitis. The appendix is not well visualized although no inflammatory changes are noted to suggest appendicitis. Small bowel and stomach appear within normal limits. Vascular/Lymphatic: Aortic atherosclerosis. No enlarged abdominal or pelvic lymph nodes. Reproductive: Status post hysterectomy. No adnexal masses. Other: No abdominal wall hernia or abnormality. No abdominopelvic ascites. Musculoskeletal:  Degenerative changes of the hip joints and lumbar spine are noted. Chronic T12 compression deformity is noted. This has progressed slightly in the interval from the prior exam. IMPRESSION: Diverticulosis without diverticulitis. Fatty liver. Gallstones without complicating  factors. Chronic changes similar to that seen on the prior exam. Electronically Signed   By: Inez Catalina M.D.   On: 04/12/2021 15:13   VAS [place] LOWER EXTREMITY VENOUS (DVT)  Result Date: 04/14/2021  Lower Venous DVT Study Patient Name:  [name]  Date of Exam:   04/14/2021 Medical Rec #: [id number]       Accession #:    [id number] Date of Birth: June 11, 1927       Patient Gender: F Patient Age:   89Y Exam Location:  [facility] Procedure:      VAS [place] LOWER EXTREMITY VENOUS (DVT) Referring Phys: [id number] TIMOTHY S OPYD --------------------------------------------------------------------------------  Indications: Pain.  Comparison Study: 01/12/21 prior Performing Technologist: Archie Patten RVS  Examination Guidelines: A complete evaluation includes B-mode imaging, spectral Doppler, color Doppler, and power Doppler as needed of all accessible portions of each vessel. Bilateral testing is considered an integral part of a complete examination. Limited examinations for reoccurring indications may be performed as noted. The reflux portion of the exam is performed with the patient in reverse Trendelenburg.  +-----+---------------+---------+-----------+----------+--------------+ RIGHTCompressibilityPhasicitySpontaneityPropertiesThrombus Aging +-----+---------------+---------+-----------+----------+--------------+ CFV  Full           Yes      Yes                                 +-----+---------------+---------+-----------+----------+--------------+   +---------+---------------+---------+-----------+----------+--------------+ LEFT     CompressibilityPhasicitySpontaneityPropertiesThrombus Aging +---------+---------------+---------+-----------+----------+--------------+ CFV      Full           Yes      Yes                                 +---------+---------------+---------+-----------+----------+--------------+ SFJ      Full                                                         +---------+---------------+---------+-----------+----------+--------------+ FV Prox  Full                                                        +---------+---------------+---------+-----------+----------+--------------+ FV Mid                  Yes      Yes                                 +---------+---------------+---------+-----------+----------+--------------+ FV Distal               Yes      Yes                                 +---------+---------------+---------+-----------+----------+--------------+ PFV      Full                                                        +---------+---------------+---------+-----------+----------+--------------+  POP      Full           Yes      Yes                                 +---------+---------------+---------+-----------+----------+--------------+ PTV      Full                                                        +---------+---------------+---------+-----------+----------+--------------+ PERO     Full                                                        +---------+---------------+---------+-----------+----------+--------------+     Summary: RIGHT: - No evidence of common femoral vein obstruction.  LEFT: - There is no evidence of deep vein thrombosis in the lower extremity.  - No cystic structure found in the popliteal fossa.  *See table(s) above for measurements and observations.    Preliminary        Assessment / Plan:    #50 87 year old African-American female admitted with acute GI bleed, lower, and felt very likely diverticular in origin. She has previously documented diverticuli on CT imaging and last had colonoscopy in [place] in 2019 at which time she was also having some bleeding.  No active bleeding overnight, hemoglobin overall stable, no transfusion requirement  Eliquis has been on hold  #2 history of prior known provoked DVT/PE  Complaining of left calf pain onset yesterday-lower extremity  Doppler has been done report pending hopefully has not developed another acute DVT  #3 status post pacemaker 4.  History of hypertension  Plan; advance to solid diet Continue to trend hemoglobin If she has developed another clot certainly okay to resume Eliquis or other anticoagulation.  If Dopplers negative may hold another day and until we assure complete resolution of diverticular bleed.   Principal Problem:   Lower GI bleed Active Problems:   Post-surgical hypothyroidism   Hypertension   History of pulmonary embolus (PE)   History of DVT (deep vein thrombosis)   Chronic anticoagulation     LOS: 0 days   Mileidy Atkin PA-C 04/14/2021, 11:11 AM

## 2021-04-14 NOTE — Progress Notes (Signed)
Pt complaining of Lt calf/leg pain. Pt asked for SCDs to be removed. Pt stated that it feels like when she had a blood clot in her leg. Will update on call provider.

## 2021-04-14 NOTE — Plan of Care (Signed)

## 2021-04-14 NOTE — Progress Notes (Signed)
Cross-coverage note:   Patient complaining of left calf pain similar to when she had a DVT, has been off of Eliquis since 6/19 due to GI bleeding. Plan to check LLE venous US.

## 2021-04-14 NOTE — Progress Notes (Signed)
Lower extremity venous has been completed.   Preliminary results in CV Proc.   Abram Sander 04/14/2021 9:44 AM

## 2021-04-15 ENCOUNTER — Other Ambulatory Visit: Payer: Self-pay

## 2021-04-15 ENCOUNTER — Telehealth: Payer: Self-pay

## 2021-04-15 DIAGNOSIS — I10 Essential (primary) hypertension: Secondary | ICD-10-CM | POA: Diagnosis not present

## 2021-04-15 DIAGNOSIS — Z7901 Long term (current) use of anticoagulants: Secondary | ICD-10-CM | POA: Diagnosis not present

## 2021-04-15 DIAGNOSIS — K922 Gastrointestinal hemorrhage, unspecified: Secondary | ICD-10-CM

## 2021-04-15 DIAGNOSIS — Z20822 Contact with and (suspected) exposure to covid-19: Secondary | ICD-10-CM | POA: Diagnosis not present

## 2021-04-15 LAB — CBC
HCT: 28.7 % — ABNORMAL LOW (ref 36.0–46.0)
Hemoglobin: 9.3 g/dL — ABNORMAL LOW (ref 12.0–15.0)
MCH: 27.9 pg (ref 26.0–34.0)
MCHC: 32.4 g/dL (ref 30.0–36.0)
MCV: 86.2 fL (ref 80.0–100.0)
Platelets: 132 10*3/uL — ABNORMAL LOW (ref 150–400)
RBC: 3.33 MIL/uL — ABNORMAL LOW (ref 3.87–5.11)
RDW: 14.7 % (ref 11.5–15.5)
WBC: 4.6 10*3/uL (ref 4.0–10.5)
nRBC: 0 % (ref 0.0–0.2)

## 2021-04-15 LAB — URINALYSIS, COMPLETE (UACMP) WITH MICROSCOPIC
Bacteria, UA: NONE SEEN
Bilirubin Urine: NEGATIVE
Glucose, UA: NEGATIVE mg/dL
Hgb urine dipstick: NEGATIVE
Ketones, ur: NEGATIVE mg/dL
Leukocytes,Ua: NEGATIVE
Nitrite: NEGATIVE
Protein, ur: NEGATIVE mg/dL
Specific Gravity, Urine: 1.008 (ref 1.005–1.030)
pH: 6 (ref 5.0–8.0)

## 2021-04-15 LAB — MAGNESIUM: Magnesium: 1.8 mg/dL (ref 1.7–2.4)

## 2021-04-15 MED ORDER — ELIQUIS 5 MG PO TABS: 5.0000 mg | ORAL_TABLET | Freq: Two times a day (BID) | ORAL | Status: AC

## 2021-04-15 NOTE — Telephone Encounter (Signed)
-----   Message from Alfredia Ferguson, PA-C sent at 04/15/2021  8:58 AM EDT ----- Regarding: labs Beth, patient being discharged from the hospital today, needs to come to the office on Monday for CBC, put under Dr. Celesta Aver name, diverticular bleed/anemia-thank you

## 2021-04-15 NOTE — Discharge Summary (Signed)
Physician Discharge Summary   [name]  female DOB: [birth year]  JJK:[id number]  PCP: [name], DO  Admit date: 04/12/2021 Discharge date: 04/15/2021  Admitted From: home Disposition:  home CODE STATUS: Full code  Discharge Instructions     Discharge instructions   Complete by: As directed    Please hold Eliquis for 5 more days and resume on 04/19/21.  Also please go to [address] to the basement lab on 04/19/21 for lab check.  GI will arrange.   Dr. Enzo Bi [facility] Course:  For full details, please see H&P, progress notes, consult notes and ancillary notes.  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   Patient reports that she had issues with bleeding previously or for which she was seen by [facility] gastroenterology back in 2019.  Records seem to report diverticulosis with bleeding as a possible cause, but patient reports they found no source.  Patient seems to state that they had previously told her not to be on blood thinners, but subsequently suffered a blood clot in 12/2018 that seem to be unprovoked for which patient was restarted on anticoagulation at that time.  Suspected diverticular bleed Acute blood loss anemia - Patient presents with complaint of bright red blood per rectum, however, none since presentation. - Hgb downtrended (12.3 >> 9's), then stable in the 9's. --GI consulted, no GI procedure planned --Eliquis was held and continued to be held for 5 more days after discharge.   --pt will follow up in GI clinic on 04/19/21 for lab check.   Essential hypertension:  --home lasix held during hospitalization and resumed at discharge.   History of DVT/PE on chronic anticoagulation Patient appeared to have had unprovoked DVT back in 12/2018 for which she was started on Eliquis. --DVT studies ordered due  to complaint of leg pain, neg for DVT in BLE. --Eliquis was held and continued to be held for 5 more days after discharge.     History of GI bleed Patient reported history of a GI bleed that appears possibly occurred back in 2019 for which patient was seen by [facility] gastroenterology.   Symptomatic bradycardia s/p pacemaker: Patient had a Saint Jude dual-chamber pacemaker placed on 01/11/2021 by Dr. Lovena Le. -no issues   Diastolic dysfunction: Patient appears to be euvolemic at this time.  Last EF noted to be 65 to 70% with grade 1 diastolic dysfunction in 3/71/6967.   --Pt received MIVF on admission and then d/c'ed.  Home lasix held and resumed at discharge.   Hypothyroidism -TSH: 2.627 -Continue levothyroxine   GERD --cont protonix  Urinary frequency and urgency Pt complained of urinary frequency on 6/23, and believed she developed an UTI from the [place].  UA obtained which was neg for infection.     Discharge Diagnoses:  Principal Problem:   Lower GI bleed Active Problems:   Post-surgical hypothyroidism   Hypertension   History of pulmonary embolus (PE)   History of DVT (deep vein thrombosis)   Chronic anticoagulation     Discharge Instructions:  Allergies as of 04/15/2021       Reactions   Codeine Other (See Comments)   Caused vertigo to the point of passing out        Medication List     TAKE these medications    Eliquis 5 MG Tabs tablet  Generic drug: apixaban Take 1 tablet (5 mg total) by mouth 2 (two) times daily. Start taking on: April 19, 2021 What changed: These instructions start on April 19, 2021. If you are unsure what to do until then, ask your doctor or other care provider.   furosemide 20 MG tablet Commonly known as: LASIX Take 20 mg by mouth daily.   levothyroxine 75 MCG tablet Commonly known as: SYNTHROID Take 75 mcg by mouth daily before breakfast.   magnesium hydroxide 400 MG/5ML suspension Commonly known as: MILK OF  MAGNESIA Take 15-30 mLs by mouth daily as needed for mild constipation.   meclizine 25 MG tablet Commonly known as: ANTIVERT Take 25 mg by mouth as needed for dizziness.   mirtazapine 15 MG tablet Commonly known as: REMERON Take 15 mg by mouth at bedtime as needed (depression).   NON FORMULARY Take 30-60 mLs by mouth See admin instructions. Prune juice- Drink 30-60 ml's by mouth as needed for constipation   omeprazole 20 MG capsule Commonly known as: PRILOSEC Take 20 mg by mouth as needed (acid reflux).   polyvinyl alcohol 1.4 % ophthalmic solution Commonly known as: LIQUIFILM TEARS Place 1 drop into both eyes as needed for dry eyes.   pregabalin 75 MG capsule Commonly known as: LYRICA Take 1 capsule (75 mg total) by mouth 2 (two) times daily.   sodium chloride 0.65 % Soln nasal spray Commonly known as: OCEAN Place 1 spray into both nostrils as needed for congestion.   traMADol 50 MG tablet Commonly known as: ULTRAM Take 50 mg by mouth as needed for moderate pain or severe pain.         Follow-up Information     Esterwood, Amy S, PA-C Follow up on 04/19/2021.   Specialty: Gastroenterology Why: follow up and lab check. Contact information: [place] [address] [phone]         [name], DO Follow up in 1 week(s).   Specialty: Family Medicine Contact information: [address] [place] [address] [phone]                 Allergies  Allergen Reactions   Codeine Other (See Comments)    Caused vertigo to the point of passing out     The results of significant diagnostics from this hospitalization (including imaging, microbiology, ancillary and laboratory) are listed below for reference.   Consultations:   Procedures/Studies: CT ABDOMEN PELVIS W CONTRAST  Result Date: 04/12/2021 CLINICAL DATA:  Left-sided abdominal pain EXAM: CT ABDOMEN AND PELVIS WITH CONTRAST TECHNIQUE: Multidetector CT imaging  of the abdomen and pelvis was performed using the standard protocol following bolus administration of intravenous contrast. CONTRAST:  142mL OMNIPAQUE IOHEXOL 300 MG/ML  SOLN COMPARISON:  12/24/2020 FINDINGS: Lower chest: Mild scarring is noted in the bases bilaterally. Calcified granuloma is noted in the left lung base. Hepatobiliary: Fatty infiltration of the liver is noted. The gallbladder is well distended with stable appearing small layering stones similar to that noted on prior exam. Pancreas: Unremarkable. No pancreatic ductal dilatation or surrounding inflammatory changes. Spleen: Normal in size without focal abnormality. Adrenals/Urinary Tract: Adrenal glands are within normal limits. Renal cystic changes noted bilaterally. These changes are stable from the prior study. No renal calculi or obstructive changes are noted. Bladder is well distended. Stomach/Bowel: Diverticular change of the colon is noted without evidence of diverticulitis. The appendix is not well visualized although no inflammatory changes are noted to suggest appendicitis. Small bowel and  stomach appear within normal limits. Vascular/Lymphatic: Aortic atherosclerosis. No enlarged abdominal or pelvic lymph nodes. Reproductive: Status post hysterectomy. No adnexal masses. Other: No abdominal wall hernia or abnormality. No abdominopelvic ascites. Musculoskeletal: Degenerative changes of the hip joints and lumbar spine are noted. Chronic T12 compression deformity is noted. This has progressed slightly in the interval from the prior exam. IMPRESSION: Diverticulosis without diverticulitis. Fatty liver. Gallstones without complicating factors. Chronic changes similar to that seen on the prior exam. Electronically Signed   By: Inez Catalina M.D.   On: 04/12/2021 15:13   CUP PACEART REMOTE DEVICE CHECK  Result Date: 04/13/2021 Scheduled remote reviewed. Normal device function.  19 AMS longest 1 min. Burden <1%. EGMs appear to AT. V response 70s.  Next remote 91 days. LH  VAS [place] LOWER EXTREMITY VENOUS (DVT)  Result Date: 04/14/2021  Lower Venous DVT Study Patient Name:  BRANDALYNN OFALLON  Date of Exam:   04/14/2021 Medical Rec #: [id number]       Accession #:    [id number] Date of Birth: 11-21-1926       Patient Gender: F Patient Age:   89Y Exam Location[facility]  [facility] Procedure:      VAS [place] LOWER EXTREMITY VENOUS (DVT) Referring Phys: [id number] TIMOTHY S OPYD --------------------------------------------------------------------------------  Indications: Pain.  Comparison Study: 01/12/21 prior Performing Technologist: Archie Patten RVS  Examination Guidelines: A complete evaluation includes B-mode imaging, spectral Doppler, color Doppler, and power Doppler as needed of all accessible portions of each vessel. Bilateral testing is considered an integral part of a complete examination. Limited examinations for reoccurring indications may be performed as noted. The reflux portion of the exam is performed with the patient in reverse Trendelenburg.  +-----+---------------+---------+-----------+----------+--------------+ RIGHTCompressibilityPhasicitySpontaneityPropertiesThrombus Aging +-----+---------------+---------+-----------+----------+--------------+ CFV  Full           Yes      Yes                                 +-----+---------------+---------+-----------+----------+--------------+   +---------+---------------+---------+-----------+----------+--------------+ LEFT     CompressibilityPhasicitySpontaneityPropertiesThrombus Aging +---------+---------------+---------+-----------+----------+--------------+ CFV      Full           Yes      Yes                                 +---------+---------------+---------+-----------+----------+--------------+ SFJ      Full                                                        +---------+---------------+---------+-----------+----------+--------------+ FV Prox  Full                                                         +---------+---------------+---------+-----------+----------+--------------+ FV Mid                  Yes      Yes                                 +---------+---------------+---------+-----------+----------+--------------+  FV Distal               Yes      Yes                                 +---------+---------------+---------+-----------+----------+--------------+ PFV      Full                                                        +---------+---------------+---------+-----------+----------+--------------+ POP      Full           Yes      Yes                                 +---------+---------------+---------+-----------+----------+--------------+ PTV      Full                                                        +---------+---------------+---------+-----------+----------+--------------+ PERO     Full                                                        +---------+---------------+---------+-----------+----------+--------------+     Summary: RIGHT: - No evidence of common femoral vein obstruction.  LEFT: - There is no evidence of deep vein thrombosis in the lower extremity.  - No cystic structure found in the popliteal fossa.  *See table(s) above for measurements and observations.    Preliminary       Labs: BNP (last 3 results) No results for input(s): BNP in the last 8760 hours. Basic Metabolic Panel: Recent Labs  Lab 04/12/21 1200 04/13/21 0305 04/15/21 0125  NA 139 140 141  K 4.1 3.8 3.7  CL 105 107 109  CO2 29 28 30   GLUCOSE 100* 111* 106*  BUN 7* 11 13  CREATININE 0.81 0.70 0.76  CALCIUM 9.2 8.6* 8.7*  MG  --   --  1.8   Liver Function Tests: Recent Labs  Lab 04/12/21 1200  AST 19  ALT 11  ALKPHOS 80  BILITOT 0.7  PROT 6.9  ALBUMIN 3.3*   Recent Labs  Lab 04/12/21 1200  LIPASE 27   No results for input(s): AMMONIA in the last 168 hours. CBC: Recent Labs  Lab 04/12/21 1200  04/12/21 1728 04/13/21 1803 04/14/21 0030 04/14/21 0555 04/14/21 1208 04/15/21 0125  WBC 4.1   < > 5.2 4.0 3.9* 4.4 4.6  NEUTROABS 2.7  --   --   --   --   --   --   HGB 12.3   < > 10.9* 9.9* 9.9* 11.1* 9.3*  HCT 38.0   < > 33.6* 29.7* 30.8* 34.0* 28.7*  MCV 86.0   < > 85.1 84.6 85.6 85.4 86.2  PLT 160   < > 146* 135* 138* 155 132*   < > = values in this interval not displayed.  Cardiac Enzymes: No results for input(s): CKTOTAL, CKMB, CKMBINDEX, TROPONINI in the last 168 hours. BNP: Invalid input(s): POCBNP CBG: No results for input(s): GLUCAP in the last 168 hours. D-Dimer No results for input(s): DDIMER in the last 72 hours. Hgb A1c No results for input(s): HGBA1C in the last 72 hours. Lipid Profile No results for input(s): CHOL, HDL, LDLCALC, TRIG, CHOLHDL, LDLDIRECT in the last 72 hours. Thyroid function studies Recent Labs    04/12/21 2224  TSH 2.627   Anemia work up No results for input(s): VITAMINB12, FOLATE, FERRITIN, TIBC, IRON, RETICCTPCT in the last 72 hours. Urinalysis    Component Value Date/Time   COLORURINE STRAW (A) 02/14/2021 1100   APPEARANCEUR CLEAR 02/14/2021 1100   LABSPEC 1.006 02/14/2021 1100   PHURINE 9.0 (H) 02/14/2021 1100   GLUCOSEU NEGATIVE 02/14/2021 1100   HGBUR SMALL (A) 02/14/2021 1100   BILIRUBINUR NEGATIVE 02/14/2021 1100   KETONESUR NEGATIVE 02/14/2021 1100   PROTEINUR NEGATIVE 02/14/2021 1100   NITRITE NEGATIVE 02/14/2021 1100   LEUKOCYTESUR NEGATIVE 02/14/2021 1100   Sepsis Labs Invalid input(s): PROCALCITONIN,  WBC,  LACTICIDVEN Microbiology Recent Results (from the past 240 hour(s))  Resp Panel by RT-PCR (Flu A&B, Covid) Nasopharyngeal Swab     Status: None   Collection Time: 04/12/21  1:02 PM   Specimen: Nasopharyngeal Swab; Nasopharyngeal(NP) swabs in vial transport medium  Result Value Ref Range Status   SARS Coronavirus 2 by RT PCR NEGATIVE NEGATIVE Final    Comment: (NOTE) SARS-CoV-2 target nucleic acids are NOT  DETECTED.  The SARS-CoV-2 RNA is generally detectable in upper respiratory specimens during the acute phase of infection. The lowest concentration of SARS-CoV-2 viral copies this assay can detect is 138 copies/mL. A negative result does not preclude SARS-Cov-2 infection and should not be used as the sole basis for treatment or other patient management decisions. A negative result may occur with  improper specimen collection/handling, submission of specimen other than nasopharyngeal swab, presence of viral mutation(s) within the areas targeted by this assay, and inadequate number of viral copies(<138 copies/mL). A negative result must be combined with clinical observations, patient history, and epidemiological information. The expected result is Negative.  Fact Sheet for Patients:  EntrepreneurPulse.com.au  Fact Sheet for Healthcare Providers:  IncredibleEmployment.be  This test is no t yet approved or cleared by the [place] FDA and  has been authorized for detection and/or diagnosis of SARS-CoV-2 by FDA under an Emergency Use Authorization (EUA). This EUA will remain  in effect (meaning this test can be used) for the duration of the COVID-19 declaration under Section 564(b)(1) of the Act, 21 U.S.C.section 360bbb-3(b)(1), unless the authorization is terminated  or revoked sooner.       Influenza A by PCR NEGATIVE NEGATIVE Final   Influenza B by PCR NEGATIVE NEGATIVE Final    Comment: (NOTE) The Xpert Xpress SARS-CoV-2/FLU/RSV plus assay is intended as an aid in the diagnosis of influenza from Nasopharyngeal swab specimens and should not be used as a sole basis for treatment. Nasal washings and aspirates are unacceptable for Xpert Xpress SARS-CoV-2/FLU/RSV testing.  Fact Sheet for Patients: EntrepreneurPulse.com.au  Fact Sheet for Healthcare Providers: IncredibleEmployment.be  This test is not yet  approved or cleared by the [place] FDA and has been authorized for detection and/or diagnosis of SARS-CoV-2 by FDA under an Emergency Use Authorization (EUA). This EUA will remain in effect (meaning this test can be used) for the duration of the COVID-19 declaration under Section 564(b)(1) of the Act, 21  U.S.C. section 360bbb-3(b)(1), unless the authorization is terminated or revoked.  Performed at [facility], [place] [address], [place], [place] [address]      Total time spend on discharging this patient, including the last patient exam, discussing the hospital stay, instructions for ongoing care as it relates to all pertinent caregivers, as well as preparing the medical discharge records, prescriptions, and/or referrals as applicable, is 45 minutes.    Enzo Bi, MD  [facility] 04/15/2021, 9:23 AM

## 2021-04-15 NOTE — Progress Notes (Signed)
Attempted to call daughter Olegario Shearer to alert of discharge. No response at this time, will try again.

## 2021-04-15 NOTE — Progress Notes (Signed)
Patient was complaining of dizziness and heart racing "heart pounding and pinching". Vitals taken and b/p was elevated 172/84, HR in 70's. Obtained EKG, showed NSR. Placed patient back on telemetry monitoring. MD Cindie Laroche notified.

## 2021-04-15 NOTE — Discharge Instructions (Signed)
Please come to the [facility] office [address] for labs on Monday, 04/19/2021-you may come in anytime that morning-Dr Gessner/ Office will call you with result of follow HGB

## 2021-04-16 LAB — BASIC METABOLIC PANEL
Anion gap: 2 — ABNORMAL LOW (ref 5–15)
BUN: 13 mg/dL (ref 8–23)
CO2: 30 mmol/L (ref 22–32)
Calcium: 8.7 mg/dL — ABNORMAL LOW (ref 8.9–10.3)
Chloride: 109 mmol/L (ref 98–111)
Creatinine, Ser: 0.76 mg/dL (ref 0.44–1.00)
GFR, Estimated: >60 mL/min (ref >60)
Glucose, Bld: 106 mg/dL — ABNORMAL HIGH (ref 70–99)
Potassium: 3.7 mmol/L (ref 3.5–5.1)
Sodium: 141 mmol/L (ref 135–145)

## 2021-04-22 ENCOUNTER — Encounter: Payer: Self-pay | Admitting: Physician Assistant

## 2021-04-22 DIAGNOSIS — Z8719 Personal history of other diseases of the digestive system: Secondary | ICD-10-CM | POA: Diagnosis not present

## 2021-04-22 DIAGNOSIS — Z7901 Long term (current) use of anticoagulants: Secondary | ICD-10-CM | POA: Diagnosis not present

## 2021-04-22 DIAGNOSIS — Z09 Encounter for follow-up examination after completed treatment for conditions other than malignant neoplasm: Secondary | ICD-10-CM | POA: Diagnosis not present

## 2021-04-22 DIAGNOSIS — Z86711 Personal history of pulmonary embolism: Secondary | ICD-10-CM | POA: Diagnosis not present

## 2021-04-28 NOTE — Progress Notes (Signed)
Electrophysiology Office Note Date: 04/29/2021  ID:  [name], DOB 1926-10-26, MRN [id number]  PCP: [name], DO Primary Cardiologist: None Electrophysiologist: Cristopher Peru, MD   CC: Pacemaker follow-up  [name] is a 87 y.o. female seen today for Cristopher Peru, MD for routine electrophysiology followup.    She was admitted 6/20-6/23 for GI bleed. Eliquis held and resumed 04/19/21  Since discharge from hospital the patient reports doing well.  she denies chest pain, palpitations, dyspnea, PND, orthopnea, nausea, vomiting, dizziness, syncope, edema, weight gain, or early satiety.  Device History: St. Jude Dual Chamber PPM implanted 01/11/21 for symptomatic bradycardia  Past Medical History:  Diagnosis Date   Central retinal artery occlusion of right eye 2015   lost complete vision   Edema    Hypertension    not on BP medication currently   Neuropathy    Post-surgical hypothyroidism    Pulmonary emboli (HCC)    VTE (venous thromboembolism) 12/2018   LLE DVT and PE   Past Surgical History:  Procedure Laterality Date   COLONOSCOPY  2019   PACEMAKER IMPLANT N/A 01/11/2021   Procedure: PACEMAKER IMPLANT;  Surgeon: Evans Lance, MD;  Location: [place];  Service: Cardiovascular;  Laterality: N/A;   REPLACEMENT TOTAL KNEE Right 2008   TOTAL THYROIDECTOMY      Current Outpatient Medications  Medication Sig Dispense Refill   ELIQUIS 5 MG TABS tablet Take 1 tablet (5 mg total) by mouth 2 (two) times daily. 60 tablet    furosemide (LASIX) 20 MG tablet Take 20 mg by mouth daily.     levothyroxine (SYNTHROID) 75 MCG tablet Take 75 mcg by mouth daily before breakfast.     magnesium hydroxide (MILK OF MAGNESIA) 400 MG/5ML suspension Take 15-30 mLs by mouth daily as needed for mild constipation.     meclizine (ANTIVERT) 25 MG tablet Take 25 mg by mouth as needed for dizziness.     mirtazapine (REMERON) 15 MG tablet Take 15 mg by mouth at bedtime as needed  (depression).     NON FORMULARY Take 30-60 mLs by mouth See admin instructions. Prune juice- Drink 30-60 ml's by mouth as needed for constipation     omeprazole (PRILOSEC) 20 MG capsule Take 20 mg by mouth as needed (acid reflux).     polyvinyl alcohol (LIQUIFILM TEARS) 1.4 % ophthalmic solution Place 1 drop into both eyes as needed for dry eyes. 15 mL 0   pregabalin (LYRICA) 75 MG capsule Take 1 capsule (75 mg total) by mouth 2 (two) times daily. 60 capsule 0   traMADol (ULTRAM) 50 MG tablet Take 50 mg by mouth as needed for moderate pain or severe pain.     No current facility-administered medications for this visit.    Allergies:   Codeine   Social History: Social History   Socioeconomic History   Marital status: Widowed    Spouse name: Not on file   Number of children: Not on file   Years of education: Not on file   Highest education level: Not on file  Occupational History   Occupation: retired  Tobacco Use   Smoking status: Never   Smokeless tobacco: Never  Substance and Sexual Activity   Alcohol use: Never   Drug use: Never   Sexual activity: Not on file  Other Topics Concern   Not on file  Social History Narrative   ** Merged History Encounter **       Social Determinants of Health  Financial Resource Strain: Not on file  Food Insecurity: Not on file  Transportation Needs: Not on file  Physical Activity: Not on file  Stress: Not on file  Social Connections: Not on file  Intimate Partner Violence: Not on file    Family History: No family history on file.   Review of Systems: All other systems reviewed and are otherwise negative except as noted above.  Physical Exam: Vitals:   04/29/21 1006  BP: 122/80  Pulse: 97  SpO2: 96%  Weight: 208 lb (94.3 kg)  Height: 5\' 7"  (1.702 m)     GEN- The patient is well appearing, alert and oriented x 3 today.   HEENT: normocephalic, atraumatic; sclera clear, conjunctiva pink; hearing intact; oropharynx clear;  neck supple  Lungs- Clear to ausculation bilaterally, normal work of breathing.  No wheezes, rales, rhonchi Heart- Regular rate and rhythm, no murmurs, rubs or gallops  GI- soft, non-tender, non-distended, bowel sounds present  Extremities- no clubbing or cyanosis. No edema MS- no significant deformity or atrophy Skin- warm and dry, no rash or lesion; PPM pocket well healed Psych- euthymic mood, full affect Neuro- strength and sensation are intact  PPM Interrogation- reviewed in detail today,  See PACEART report  EKG:  EKG is not ordered today.  Recent Labs: 03/02/2021: NT-Pro BNP 106 04/12/2021: ALT 11; TSH 2.627 04/15/2021: BUN 13; Creatinine, Ser 0.76; Hemoglobin 9.3; Magnesium 1.8; Platelets 132; Potassium 3.7; Sodium 141   Wt Readings from Last 3 Encounters:  04/29/21 208 lb (94.3 kg)  04/12/21 203 lb 0.7 oz (92.1 kg)  02/14/21 203 lb 0.7 oz (92.1 kg)     Other studies Reviewed: Additional studies/ records that were reviewed today include: Previous EP office notes, Previous remote checks, Most recent labwork.   Assessment and Plan:  1. 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 GI bleed No further bleeding. Reports having labwork at PCP last week.   Current medicines are reviewed at length with the patient today.   The patient does not have concerns regarding her medicines.  The following changes were made today:  none  Labs/ tests ordered today include:  None  Disposition:   Follow up with Dr. Lovena Le in 12 Months    Signed, Annamaria Helling  04/29/2021 10:48 AM  [place] [address] [place] [address] [phone] (office) [phone] (fax)

## 2021-04-29 ENCOUNTER — Ambulatory Visit (INDEPENDENT_AMBULATORY_CARE_PROVIDER_SITE_OTHER): Payer: Medicare Other | Admitting: Student

## 2021-04-29 ENCOUNTER — Other Ambulatory Visit: Payer: Self-pay

## 2021-04-29 VITALS — BP 122/80 | HR 97 | Ht 67.0 in | Wt 208.0 lb

## 2021-04-29 DIAGNOSIS — I10 Essential (primary) hypertension: Secondary | ICD-10-CM

## 2021-04-29 DIAGNOSIS — I8290 Acute embolism and thrombosis of unspecified vein: Secondary | ICD-10-CM | POA: Diagnosis not present

## 2021-04-29 DIAGNOSIS — I4430 Unspecified atrioventricular block: Secondary | ICD-10-CM

## 2021-04-29 LAB — CUP PACEART INCLINIC DEVICE CHECK
Battery Remaining Longevity: 140 mo
Battery Voltage: 3.04 V
Brady Statistic RA Percent Paced: 0.37 %
Brady Statistic RV Percent Paced: 1.4 %
Date Time Interrogation Session: 20220707104658
Implantable Lead Implant Date: 20220321
Implantable Lead Implant Date: 20220321
Implantable Lead Location: 753859
Implantable Lead Location: 753860
Implantable Pulse Generator Implant Date: 20220321
Lead Channel Impedance Value: 487.5 Ohm
Lead Channel Impedance Value: 575 Ohm
Lead Channel Pacing Threshold Amplitude: 0.75 V
Lead Channel Pacing Threshold Amplitude: 0.75 V
Lead Channel Pacing Threshold Amplitude: 0.75 V
Lead Channel Pacing Threshold Amplitude: 0.75 V
Lead Channel Pacing Threshold Pulse Width: 0.4 ms
Lead Channel Pacing Threshold Pulse Width: 0.4 ms
Lead Channel Pacing Threshold Pulse Width: 0.4 ms
Lead Channel Pacing Threshold Pulse Width: 0.4 ms
Lead Channel Sensing Intrinsic Amplitude: 12 mV
Lead Channel Sensing Intrinsic Amplitude: 3.6 mV
Lead Channel Setting Pacing Amplitude: 2 V
Lead Channel Setting Pacing Amplitude: 2.5 V
Lead Channel Setting Pacing Pulse Width: 0.4 ms
Lead Channel Setting Sensing Sensitivity: 2 mV
Pulse Gen Model: 2272
Pulse Gen Serial Number: 3910619

## 2021-04-29 NOTE — Progress Notes (Signed)
Remote pacemaker transmission.   

## 2021-04-29 NOTE — Patient Instructions (Signed)
Medication Instructions:  Your physician recommends that you continue on your current medications as directed. Please refer to the Current Medication list given to you today.  *If you need a refill on your cardiac medications before your next appointment, please call your pharmacy*   Lab Work: None If you have labs (blood work) drawn today and your tests are completely normal, you will receive your results only by: [place] (if you have MyChart) OR A paper copy in the mail If you have any lab test that is abnormal or we need to change your treatment, we will call you to review the results.   Follow-Up: At [facility], you and your health needs are our priority.  As part of our continuing mission to provide you with exceptional heart care, we have created designated Provider Care Teams.  These Care Teams include your primary Cardiologist (physician) and Advanced Practice Providers (APPs -  Physician Assistants and Nurse Practitioners) who all work together to provide you with the care you need, when you need it.  We recommend signing up for the patient portal called "MyChart".  Sign up information is provided on this After Visit Summary.  MyChart is used to connect with patients for Virtual Visits (Telemedicine).  Patients are able to view lab/test results, encounter notes, upcoming appointments, etc.  Non-urgent messages can be sent to your provider as well.   To learn more about what you can do with MyChart, go to [email or web address].    Your next appointment:   1 year(s)  The format for your next appointment:   In Person  Provider:   You may see Cristopher Peru, MD or one of the following Advanced Practice Providers on your designated Care Team:    Tommye Standard, [place] "[facility]" [place], [place]

## 2021-05-06 DIAGNOSIS — G629 Polyneuropathy, unspecified: Secondary | ICD-10-CM | POA: Diagnosis not present

## 2021-05-06 DIAGNOSIS — R531 Weakness: Secondary | ICD-10-CM | POA: Diagnosis not present

## 2021-05-24 ENCOUNTER — Ambulatory Visit: Payer: Medicare Other | Admitting: Physician Assistant

## 2021-05-24 ENCOUNTER — Other Ambulatory Visit (INDEPENDENT_AMBULATORY_CARE_PROVIDER_SITE_OTHER): Payer: Medicare Other

## 2021-05-24 ENCOUNTER — Encounter: Payer: Self-pay | Admitting: Physician Assistant

## 2021-05-24 VITALS — BP 130/70 | HR 82 | Ht 67.0 in | Wt 206.0 lb

## 2021-05-24 DIAGNOSIS — D649 Anemia, unspecified: Secondary | ICD-10-CM

## 2021-05-24 DIAGNOSIS — R531 Weakness: Secondary | ICD-10-CM

## 2021-05-24 DIAGNOSIS — K5731 Diverticulosis of large intestine without perforation or abscess with bleeding: Secondary | ICD-10-CM

## 2021-05-24 DIAGNOSIS — Z8719 Personal history of other diseases of the digestive system: Secondary | ICD-10-CM

## 2021-05-24 DIAGNOSIS — Z7901 Long term (current) use of anticoagulants: Secondary | ICD-10-CM

## 2021-05-24 LAB — CBC WITH DIFFERENTIAL/PLATELET
Basophils Absolute: 0.1 10*3/uL (ref 0.0–0.1)
Basophils Relative: 1.2 % (ref 0.0–3.0)
Eosinophils Absolute: 0 10*3/uL (ref 0.0–0.7)
Eosinophils Relative: 0.7 % (ref 0.0–5.0)
HCT: 32.5 % — ABNORMAL LOW (ref 36.0–46.0)
Hemoglobin: 10.6 g/dL — ABNORMAL LOW (ref 12.0–15.0)
Lymphocytes Relative: 23.7 % (ref 12.0–46.0)
Lymphs Abs: 1 10*3/uL (ref 0.7–4.0)
MCHC: 32.5 g/dL (ref 30.0–36.0)
MCV: 82.2 fl (ref 78.0–100.0)
Monocytes Absolute: 0.5 10*3/uL (ref 0.1–1.0)
Monocytes Relative: 10.8 % (ref 3.0–12.0)
Neutro Abs: 2.6 10*3/uL (ref 1.4–7.7)
Neutrophils Relative %: 63.6 % (ref 43.0–77.0)
Platelets: 182 10*3/uL (ref 150.0–400.0)
RBC: 3.95 Mil/uL (ref 3.87–5.11)
RDW: 15.1 % (ref 11.5–15.5)
WBC: 4.2 10*3/uL (ref 4.0–10.5)

## 2021-05-24 LAB — COMPREHENSIVE METABOLIC PANEL
ALT: 8 U/L (ref 0–35)
AST: 15 U/L (ref 0–37)
Albumin: 3.7 g/dL (ref 3.5–5.2)
Alkaline Phosphatase: 90 U/L (ref 39–117)
BUN: 12 mg/dL (ref 6–23)
CO2: 28 mEq/L (ref 19–32)
Calcium: 9.5 mg/dL (ref 8.4–10.5)
Chloride: 107 mEq/L (ref 96–112)
Creatinine, Ser: 0.86 mg/dL (ref 0.40–1.20)
GFR: 58.61 mL/min — ABNORMAL LOW (ref >60.00)
Glucose, Bld: 91 mg/dL (ref 70–99)
Potassium: 4.6 mEq/L (ref 3.5–5.1)
Sodium: 138 mEq/L (ref 135–145)
Total Bilirubin: 0.4 mg/dL (ref 0.2–1.2)
Total Protein: 7.4 g/dL (ref 6.0–8.3)

## 2021-05-24 NOTE — Progress Notes (Signed)
Subjective:    Patient ID: [name], female    DOB: 1927-09-11, 87 y.o.   MRN: [id number]  HPI [name] is a pleasant 87 year old African-American female, recently established with Dr. Carlean Purl when she was hospitalized in June 2022 with a lower GI bleed which occurred in the setting of Eliquis.. Patient has previously documented diverticulosis and had reported prior issues with bleeding when she was living in [place].  She had reported undergoing endoscopic evaluation at that time.  We do not have copies of those records. Patient had CT of the abdomen pelvis done on at the time of admission which showed evidence of fatty liver and diverticulosis without diverticulitis, otherwise negative exam. 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 Patient's hemoglobin was 12 on admission and drifted to 9.3 at the time of discharge.  She did not undergo any endoscopic evaluation, and bleeding had resolved at the time of discharge. She comes in today for follow-up.  She says she continues to feel generally weak and sometimes feels a bit lightheaded.  She says sometimes when she is ambulating she will feel short of breath.  The symptoms have been persistent over the past many weeks.  She has noticed a darker stool off-and-on but no grossly bloody bowel movements.  Appetite has been fine, no nausea or vomiting.  She has some issues with constipation but does well with prune juice on a daily basis and milk of magnesia as needed. Review of Systems.Pertinent positive and negative review of systems were noted in the above HPI section.  All other review of systems was otherwise negative.   Outpatient Encounter Medications as of 05/24/2021  Medication Sig   ELIQUIS 5 MG TABS tablet Take 1 tablet (5 mg total) by mouth 2 (two) times daily.   furosemide (LASIX) 20 MG  tablet Take 20 mg by mouth daily.   levothyroxine (SYNTHROID) 75 MCG tablet Take 75 mcg by mouth daily before breakfast.   magnesium hydroxide (MILK OF MAGNESIA) 400 MG/5ML suspension Take 15-30 mLs by mouth daily as needed for mild constipation.   meclizine (ANTIVERT) 25 MG tablet Take 25 mg by mouth as needed for dizziness.   mirtazapine (REMERON) 15 MG tablet Take 15 mg by mouth at bedtime as needed (depression).   NON FORMULARY Take 30-60 mLs by mouth See admin instructions. Prune juice- Drink 30-60 ml's by mouth as needed for constipation   omeprazole (PRILOSEC) 20 MG capsule Take 20 mg by mouth as needed (acid reflux).   polyvinyl alcohol (LIQUIFILM TEARS) 1.4 % ophthalmic solution Place 1 drop into both eyes as needed for dry eyes.   pregabalin (LYRICA) 75 MG capsule Take 1 capsule (75 mg total) by mouth 2 (two) times daily.   traMADol (ULTRAM) 50 MG tablet Take 50 mg by mouth as needed for moderate pain or severe pain.   [DISCONTINUED] hydrochlorothiazide (MICROZIDE) 12.5 MG capsule Take 1 capsule (12.5 mg total) by mouth daily. (Patient not taking: No sig reported)   No facility-administered encounter medications on file as of 05/24/2021.   Allergies  Allergen Reactions   Codeine Other (See Comments)    Caused vertigo to the point of passing out   Patient Active Problem List   Diagnosis Date Noted   Diverticulosis of colon with hemorrhage 05/24/2021   Lower GI bleed 04/12/2021   History of  pulmonary embolus (PE) 04/12/2021   History of DVT (deep vein thrombosis) 04/12/2021   Chronic anticoagulation 04/12/2021   AV block 01/09/2021   Fall at home, initial encounter 09/27/2020   Post-surgical hypothyroidism    Neuropathy    Hypertension    VTE (venous thromboembolism) 12/2018   Social History   Socioeconomic History   Marital status: Widowed    Spouse name: Not on file   Number of children: Not on file   Years of education: Not on file   Highest education level: Not on  file  Occupational History   Occupation: retired  Tobacco Use   Smoking status: Never   Smokeless tobacco: Never  Substance and Sexual Activity   Alcohol use: Never   Drug use: Never   Sexual activity: Not on file  Other Topics Concern   Not on file  Social History Narrative   ** Merged History Encounter **       Social Determinants of Health   Financial Resource Strain: Not on file  Food Insecurity: Not on file  Transportation Needs: Not on file  Physical Activity: Not on file  Stress: Not on file  Social Connections: Not on file  Intimate Partner Violence: Not on file    Ms. Peloquin's family history is not on file.      Objective:    Vitals:   05/24/21 0903  BP: 130/70  Pulse: 82  SpO2: 98%    Physical Exam Well-developed well-nourished elderly AA female  in no acute distress.  Accompanied by her daughter.  Patient is using a rolling walker-  Weight, 206 BMI 32.2  HEENT; nontraumatic normocephalic, EOMI, PE R LA, sclera anicteric. Oropharynx; not examined today Neck; supple, no JVD Cardiovascular; regular rate and rhythm with S1-S2, no murmur rub or gallop, ICD left chest wall Pulmonary; Clear bilaterally Abdomen; soft, nontender, nondistended, no palpable mass or hepatosplenomegaly, bowel sounds are active Rectal; not done today Skin; benign exam, no jaundice rash or appreciable lesions Extremities; no clubbing cyanosis 1-2+ edema ankles Neuro/Psych; alert and oriented x4, grossly nonfocal mood and affect appropriate        Assessment & Plan:   #4  87 year old African-American female with documented diverticular disease who is seen in follow-up today after recent hospitalization with presumed diverticular bleed which occurred in the setting of Eliquis. Patient had CT imaging done at the time of admission confirming diverticulosis, no other abnormality noted.  She was managed expectantly, bleeding resolved with holding Eliquis and she did not require  transfusions. Hemoglobin 9.3 on discharge. Eliquis has been resumed.  Patient has not been aware of any overt bleeding but has noticed a darker stool on occasion since discharge.  She generally continues to feel weak, occasionally lightheaded and says she is always short of breath with exertion.  #2 history of unprovoked DVT-on Eliquis 3.  Hypertension 4.  History of AV block with bradycardia status post pacemaker 5.  Hypothyroidism 6.  GERD stable  Plan CBC with differential and c-Met today. If hemoglobin has drifted further, patient may require further work-up.  Hopefully hemoglobin stable. Further recommendations pending results of labs. She will follow-up with Dr. Carlean Purl or myself as needed.    Bralynn Velador S Jaleeyah Munce PA-C 05/24/2021   Cc: [name], DO

## 2021-05-24 NOTE — Patient Instructions (Signed)
If you are age 85 or older, your body mass index should be between 23-30. Your Body mass index is 32.26 kg/m. If this is out of the aforementioned range listed, please consider follow up with your Primary Care Provider. __________________________________________________________  The  GI providers would like to encourage you to use [facility] to communicate with providers for non-urgent requests or questions.  Due to long hold times on the telephone, sending your provider a message by [facility] may be a faster and more efficient way to get a response.  Please allow 48 business hours for a response.  Please remember that this is for non-urgent requests.   Your provider has requested that you go to the basement level for lab work before leaving today. Press "B" on the elevator. The lab is located at the first door on the left as you exit the elevator.  Follow up with GI as needed.  Thank you for entrusting me with your care and choosing [facility].  Amy Esterwood, PA-C

## 2021-06-06 DIAGNOSIS — G629 Polyneuropathy, unspecified: Secondary | ICD-10-CM | POA: Diagnosis not present

## 2021-06-06 DIAGNOSIS — R531 Weakness: Secondary | ICD-10-CM | POA: Diagnosis not present

## 2021-07-07 DIAGNOSIS — R531 Weakness: Secondary | ICD-10-CM | POA: Diagnosis not present

## 2021-07-07 DIAGNOSIS — G629 Polyneuropathy, unspecified: Secondary | ICD-10-CM | POA: Diagnosis not present

## 2021-07-12 ENCOUNTER — Ambulatory Visit (INDEPENDENT_AMBULATORY_CARE_PROVIDER_SITE_OTHER): Payer: Medicare Other

## 2021-07-12 DIAGNOSIS — I4430 Unspecified atrioventricular block: Secondary | ICD-10-CM | POA: Diagnosis not present

## 2021-07-13 LAB — CUP PACEART REMOTE DEVICE CHECK
Battery Remaining Longevity: 125 mo
Battery Remaining Percentage: 95.5 %
Battery Voltage: 3.02 V
Brady Statistic AP VP Percent: 1 %
Brady Statistic AP VS Percent: 1 %
Brady Statistic AS VP Percent: 1 %
Brady Statistic AS VS Percent: 99 %
Brady Statistic RA Percent Paced: 1 %
Brady Statistic RV Percent Paced: 1 %
Date Time Interrogation Session: 20220919020021
Implantable Lead Implant Date: 20220321
Implantable Lead Implant Date: 20220321
Implantable Lead Location: 753859
Implantable Lead Location: 753860
Implantable Pulse Generator Implant Date: 20220321
Lead Channel Impedance Value: 460 Ohm
Lead Channel Impedance Value: 580 Ohm
Lead Channel Pacing Threshold Amplitude: 0.75 V
Lead Channel Pacing Threshold Amplitude: 0.75 V
Lead Channel Pacing Threshold Pulse Width: 0.4 ms
Lead Channel Pacing Threshold Pulse Width: 0.4 ms
Lead Channel Sensing Intrinsic Amplitude: 12 mV
Lead Channel Sensing Intrinsic Amplitude: 2.1 mV
Lead Channel Setting Pacing Amplitude: 2 V
Lead Channel Setting Pacing Amplitude: 2.5 V
Lead Channel Setting Pacing Pulse Width: 0.4 ms
Lead Channel Setting Sensing Sensitivity: 2 mV
Pulse Gen Model: 2272
Pulse Gen Serial Number: 3910619

## 2021-07-15 NOTE — Progress Notes (Signed)
Remote pacemaker transmission.   

## 2021-09-15 DIAGNOSIS — H43812 Vitreous degeneration, left eye: Secondary | ICD-10-CM | POA: Diagnosis not present

## 2021-09-15 DIAGNOSIS — Z961 Presence of intraocular lens: Secondary | ICD-10-CM | POA: Diagnosis not present

## 2021-10-11 ENCOUNTER — Ambulatory Visit (INDEPENDENT_AMBULATORY_CARE_PROVIDER_SITE_OTHER): Payer: Medicare Other

## 2021-10-11 DIAGNOSIS — I4430 Unspecified atrioventricular block: Secondary | ICD-10-CM

## 2021-10-11 LAB — CUP PACEART REMOTE DEVICE CHECK
Battery Remaining Longevity: 123 mo
Battery Remaining Percentage: 95.5 %
Battery Voltage: 3.02 V
Brady Statistic AP VP Percent: 1 %
Brady Statistic AP VS Percent: 1 %
Brady Statistic AS VP Percent: 1 %
Brady Statistic AS VS Percent: 99 %
Brady Statistic RA Percent Paced: 1 %
Brady Statistic RV Percent Paced: 1 %
Date Time Interrogation Session: 20221219020015
Implantable Lead Implant Date: 20220321
Implantable Lead Implant Date: 20220321
Implantable Lead Location: 753859
Implantable Lead Location: 753860
Implantable Pulse Generator Implant Date: 20220321
Lead Channel Impedance Value: 490 Ohm
Lead Channel Impedance Value: 560 Ohm
Lead Channel Pacing Threshold Amplitude: 0.75 V
Lead Channel Pacing Threshold Amplitude: 0.75 V
Lead Channel Pacing Threshold Pulse Width: 0.4 ms
Lead Channel Pacing Threshold Pulse Width: 0.4 ms
Lead Channel Sensing Intrinsic Amplitude: 12 mV
Lead Channel Sensing Intrinsic Amplitude: 3.5 mV
Lead Channel Setting Pacing Amplitude: 2 V
Lead Channel Setting Pacing Amplitude: 2.5 V
Lead Channel Setting Pacing Pulse Width: 0.4 ms
Lead Channel Setting Sensing Sensitivity: 2 mV
Pulse Gen Model: 2272
Pulse Gen Serial Number: 3910619

## 2021-10-20 NOTE — Progress Notes (Signed)
Remote pacemaker transmission.   

## 2021-11-12 DIAGNOSIS — Z7901 Long term (current) use of anticoagulants: Secondary | ICD-10-CM | POA: Diagnosis not present

## 2021-11-12 DIAGNOSIS — I519 Heart disease, unspecified: Secondary | ICD-10-CM | POA: Diagnosis not present

## 2021-11-12 DIAGNOSIS — Z5181 Encounter for therapeutic drug level monitoring: Secondary | ICD-10-CM | POA: Diagnosis not present

## 2021-11-12 DIAGNOSIS — Z950 Presence of cardiac pacemaker: Secondary | ICD-10-CM | POA: Diagnosis not present

## 2021-11-12 DIAGNOSIS — R0602 Shortness of breath: Secondary | ICD-10-CM | POA: Diagnosis not present

## 2021-11-12 DIAGNOSIS — R634 Abnormal weight loss: Secondary | ICD-10-CM | POA: Diagnosis not present

## 2021-11-12 DIAGNOSIS — R062 Wheezing: Secondary | ICD-10-CM | POA: Diagnosis not present

## 2021-11-12 DIAGNOSIS — R7309 Other abnormal glucose: Secondary | ICD-10-CM | POA: Diagnosis not present

## 2021-11-21 ENCOUNTER — Emergency Department (HOSPITAL_COMMUNITY)
Admission: EM | Admit: 2021-11-21 | Discharge: 2021-11-21 | Disposition: A | Discharge status: Home / Self Care | Payer: Medicare Other | Attending: Emergency Medicine | Admitting: Emergency Medicine

## 2021-11-21 ENCOUNTER — Other Ambulatory Visit: Payer: Self-pay

## 2021-11-21 ENCOUNTER — Emergency Department (HOSPITAL_COMMUNITY): Payer: Medicare Other

## 2021-11-21 DIAGNOSIS — I10 Essential (primary) hypertension: Secondary | ICD-10-CM | POA: Diagnosis not present

## 2021-11-21 DIAGNOSIS — Z7901 Long term (current) use of anticoagulants: Secondary | ICD-10-CM | POA: Diagnosis not present

## 2021-11-21 DIAGNOSIS — R0789 Other chest pain: Secondary | ICD-10-CM | POA: Diagnosis not present

## 2021-11-21 DIAGNOSIS — M25519 Pain in unspecified shoulder: Secondary | ICD-10-CM | POA: Diagnosis not present

## 2021-11-21 DIAGNOSIS — R079 Chest pain, unspecified: Secondary | ICD-10-CM

## 2021-11-21 DIAGNOSIS — Z79899 Other long term (current) drug therapy: Secondary | ICD-10-CM | POA: Diagnosis not present

## 2021-11-21 LAB — TROPONIN I (HIGH SENSITIVITY)
Troponin I (High Sensitivity): 3 ng/L (ref <18)
Troponin I (High Sensitivity): 3 ng/L (ref <18)

## 2021-11-21 LAB — CBC WITH DIFFERENTIAL/PLATELET
Abs Immature Granulocytes: 0 10*3/uL (ref 0.00–0.07)
Basophils Absolute: 0.1 10*3/uL (ref 0.0–0.1)
Basophils Relative: 1 %
Eosinophils Absolute: 0.1 10*3/uL (ref 0.0–0.5)
Eosinophils Relative: 2 %
HCT: 33.9 % — ABNORMAL LOW (ref 36.0–46.0)
Hemoglobin: 10.8 g/dL — ABNORMAL LOW (ref 12.0–15.0)
Immature Granulocytes: 0 %
Lymphocytes Relative: 38 %
Lymphs Abs: 1.4 10*3/uL (ref 0.7–4.0)
MCH: 25.8 pg — ABNORMAL LOW (ref 26.0–34.0)
MCHC: 31.9 g/dL (ref 30.0–36.0)
MCV: 80.9 fL (ref 80.0–100.0)
Monocytes Absolute: 0.4 10*3/uL (ref 0.1–1.0)
Monocytes Relative: 11 %
Neutro Abs: 1.7 10*3/uL (ref 1.7–7.7)
Neutrophils Relative %: 48 %
Platelets: 141 10*3/uL — ABNORMAL LOW (ref 150–400)
RBC: 4.19 MIL/uL (ref 3.87–5.11)
RDW: 16.2 % — ABNORMAL HIGH (ref 11.5–15.5)
WBC: 3.5 10*3/uL — ABNORMAL LOW (ref 4.0–10.5)
nRBC: 0 % (ref 0.0–0.2)

## 2021-11-21 LAB — BASIC METABOLIC PANEL
Anion gap: 5 (ref 5–15)
BUN: 9 mg/dL (ref 8–23)
CO2: 27 mmol/L (ref 22–32)
Calcium: 8.8 mg/dL — ABNORMAL LOW (ref 8.9–10.3)
Chloride: 109 mmol/L (ref 98–111)
Creatinine, Ser: 0.75 mg/dL (ref 0.44–1.00)
GFR, Estimated: >60 mL/min (ref >60)
Glucose, Bld: 91 mg/dL (ref 70–99)
Potassium: 3.7 mmol/L (ref 3.5–5.1)
Sodium: 141 mmol/L (ref 135–145)

## 2021-11-21 NOTE — ED Triage Notes (Signed)
CP yesterday alleviated by tylenol, returns again today  210/120, 81HR. CBG 123. 99% RA  1 nitro en route, pain relieved. Pt with hx pacemaker.

## 2021-11-21 NOTE — ED Notes (Signed)
Pt verbalized understanding of d/c instructions, meds and followup care. Denies questions. VSS, no distress noted. W/C to exit with all belongings to daughter's vehicle.

## 2021-11-21 NOTE — ED Provider Notes (Signed)
[facility] EMERGENCY DEPARTMENT Provider Note   CSN: [id number] Arrival date & time: 11/21/21  1327     History  Chief Complaint  Patient presents with   Chest Pain    [name] is a 88 y.o. female.  He is here with a complaint of on and off chest pain that started yesterday evening.  She said it feels like a pulsating sensation in the left side of her chest.  Sometimes radiates into her left shoulder.  Improved with some Tylenol but recurred again today.  She denies prior history of same.  She said that she sometimes feels a little short of breath with it.  She also has a funny feeling going throughout her body.  No recent medication changes.  No recent falls.  She is on Eliquis for blood clot.  The history is provided by the patient.  Chest Pain Pain location:  L chest Pain quality: throbbing   Pain radiates to:  L shoulder Pain severity:  Moderate Onset quality:  Gradual Duration:  2 days Timing:  Intermittent Progression:  Unchanged Chronicity:  New Context: at rest   Relieved by:  Nothing Worsened by:  Nothing Ineffective treatments: Tylenol. Associated symptoms: shortness of breath   Associated symptoms: no abdominal pain, no cough, no diaphoresis, no fever, no headache, no lower extremity edema and no vomiting   Risk factors: hypertension and prior DVT/PE       Home Medications Prior to Admission medications   Medication Sig Start Date End Date Taking? Authorizing Provider  ELIQUIS 5 MG TABS tablet Take 1 tablet (5 mg total) by mouth 2 (two) times daily. 04/19/21   Enzo Bi, MD  furosemide (LASIX) 20 MG tablet Take 20 mg by mouth daily.    [provider]  levothyroxine (SYNTHROID) 75 MCG tablet Take 75 mcg by mouth daily before breakfast.    [provider]  magnesium hydroxide (MILK OF MAGNESIA) 400 MG/5ML suspension Take 15-30 mLs by mouth daily as needed for mild constipation.    [provider]  meclizine  (ANTIVERT) 25 MG tablet Take 25 mg by mouth as needed for dizziness.    [provider]  mirtazapine (REMERON) 15 MG tablet Take 15 mg by mouth at bedtime as needed (depression).    [provider]  NON FORMULARY Take 30-60 mLs by mouth See admin instructions. Prune juice- Drink 30-60 ml's by mouth as needed for constipation    [provider]  omeprazole (PRILOSEC) 20 MG capsule Take 20 mg by mouth as needed (acid reflux).    [provider]  polyvinyl alcohol (LIQUIFILM TEARS) 1.4 % ophthalmic solution Place 1 drop into both eyes as needed for dry eyes. 10/05/20   Mercy Riding, MD  pregabalin (LYRICA) 75 MG capsule Take 1 capsule (75 mg total) by mouth 2 (two) times daily. 10/05/20   Mercy Riding, MD  traMADol (ULTRAM) 50 MG tablet Take 50 mg by mouth as needed for moderate pain or severe pain.    [provider]  hydrochlorothiazide (MICROZIDE) 12.5 MG capsule Take 1 capsule (12.5 mg total) by mouth daily. Patient not taking: No sig reported 10/05/20 04/12/21  Mercy Riding, MD      Allergies    Codeine    Review of Systems   Review of Systems  Constitutional:  Negative for diaphoresis and fever.  HENT:  Negative for sore throat.   Eyes:  Negative for visual disturbance.  Respiratory:  Positive for  shortness of breath. Negative for cough.   Cardiovascular:  Positive for chest pain.  Gastrointestinal:  Negative for abdominal pain and vomiting.  Genitourinary:  Negative for dysuria.  Musculoskeletal:  Negative for neck pain.  Skin:  Negative for rash.  Neurological:  Negative for headaches.   Physical Exam Updated Vital Signs BP 137/75 (BP Location: Right Arm)    Pulse 72    Temp 98.1 F (36.7 C) (Oral)    Resp 16    Ht 5\' 7"  (1.702 m)    Wt 90.3 kg    SpO2 98%    BMI 31.17 kg/m  Physical Exam Vitals and nursing note reviewed.  Constitutional:      General: She is not in acute distress.    Appearance: She is well-developed.  HENT:      Head: Normocephalic and atraumatic.  Eyes:     Conjunctiva/sclera: Conjunctivae normal.  Cardiovascular:     Rate and Rhythm: Normal rate and regular rhythm.     Heart sounds: Normal heart sounds. No murmur heard. Pulmonary:     Effort: Pulmonary effort is normal. No respiratory distress.     Breath sounds: Normal breath sounds.  Abdominal:     Palpations: Abdomen is soft.     Tenderness: There is no abdominal tenderness.  Musculoskeletal:        General: No swelling. Normal range of motion.     Cervical back: Neck supple.     Right lower leg: No tenderness. No edema.     Left lower leg: No tenderness. No edema.  Skin:    General: Skin is warm and dry.     Capillary Refill: Capillary refill takes less than 2 seconds.  Neurological:     General: No focal deficit present.     Mental Status: She is alert.    ED Results / Procedures / Treatments   Labs (all labs ordered are listed, but only abnormal results are displayed) Labs Reviewed  BASIC METABOLIC PANEL - Abnormal; Notable for the following components:      Result Value   Calcium 8.8 (*)    All other components within normal limits  CBC WITH DIFFERENTIAL/PLATELET - Abnormal; Notable for the following components:   WBC 3.5 (*)    Hemoglobin 10.8 (*)    HCT 33.9 (*)    MCH 25.8 (*)    RDW 16.2 (*)    Platelets 141 (*)    All other components within normal limits  TROPONIN I (HIGH SENSITIVITY)  TROPONIN I (HIGH SENSITIVITY)    EKG EKG Interpretation  Date/Time:  Sunday November 21 2021 13:40:25 EST Ventricular Rate:  73 PR Interval:  152 QRS Duration: 88 QT Interval:  400 QTC Calculation: 441 R Axis:   76 Text Interpretation: Sinus rhythm Left atrial enlargement No significant change since prior 6/22 Confirmed by Aletta Edouard [phone]) on 11/21/2021 1:43:06 PM  Radiology DG Chest Port 1 View  Result Date: 11/21/2021 CLINICAL DATA:  Chest pain EXAM: PORTABLE CHEST 1 VIEW COMPARISON:  02/14/2021 FINDINGS:  Left-sided implanted cardiac device. Stable heart size. Aortic atherosclerosis. Low lung volumes with streaky bibasilar opacities. No pleural effusion or pneumothorax. IMPRESSION: Low lung volumes with streaky bibasilar opacities, likely atelectasis. Electronically Signed   By: Davina Poke D.O.   On: 11/21/2021 14:30    Procedures Procedures    Medications Ordered in ED Medications - No data to display  ED Course/ Medical Decision Making/ A&P Clinical Course as of 11/21/21 2126  Nancy Fetter Nov 21, 2021  1402 X-ray interpreted by me as no acute infiltrates.  Awaiting radiology reading. [MB]  1724 Patient states her pain is completely gone.  She has a family member now with her.  Recommended close follow-up with her cardiologist and her primary care doctor.  They are comfortable with plan. [MB]    Clinical Course User Index [MB] Hayden Rasmussen, MD                           Medical Decision Making Amount and/or Complexity of Data Reviewed Labs: ordered. Radiology: ordered.  This patient complains of left-sided chest pain, feeling funny all over; this involves an extensive number of treatment Options and is a complaint that carries with it a high risk of complications and Morbidity. The differential includes ACS, pneumonia, dehydration, anemia  I ordered, reviewed and interpreted labs, which included CBC with white count, slight lower than priors, low hemoglobin stable from priors, low platelets, have been seen in priors, chemistries remarkable, troponins flat  I ordered imaging studies which included chest x-ray  and I independently    visualized and interpreted imaging which showed atelectasis, no acute infiltrate Additional history obtained from patient's family member Previous records obtained and reviewed in epic including prior cardiology notes  After the interventions stated above, I reevaluated the patient and found patient to be pain-free at this time.  She is comfortable  plan for outpatient follow-up with her treating providers.  For admission at this time.          Final Clinical Impression(s) / ED Diagnoses Final diagnoses:  Nonspecific chest pain    Rx / DC Orders ED Discharge Orders     None         Hayden Rasmussen, MD 11/21/21 2129

## 2021-11-21 NOTE — Discharge Instructions (Signed)
You were seen in the emergency department for some sharp stabbing left-sided chest pain.  You had an EKG blood work and a chest x-ray that did not show an obvious explanation for your symptoms.  There was no evidence of heart attack.  Please continue your current medications and follow-up with your primary care doctor and cardiologist.  Return to the emergency department if any worsening or concerning symptoms

## 2021-11-25 ENCOUNTER — Encounter: Payer: Self-pay | Admitting: Internal Medicine

## 2021-11-25 ENCOUNTER — Other Ambulatory Visit: Payer: Self-pay

## 2021-11-25 ENCOUNTER — Ambulatory Visit (INDEPENDENT_AMBULATORY_CARE_PROVIDER_SITE_OTHER): Payer: Medicare Other | Admitting: Internal Medicine

## 2021-11-25 DIAGNOSIS — Z950 Presence of cardiac pacemaker: Secondary | ICD-10-CM | POA: Diagnosis not present

## 2021-11-25 DIAGNOSIS — R001 Bradycardia, unspecified: Secondary | ICD-10-CM

## 2021-11-25 NOTE — Patient Instructions (Addendum)
Medication Instructions:  Your physician recommends that you continue on your current medications as directed. Please refer to the Current Medication list given to you today.  Labwork: None ordered.  Testing/Procedures: None ordered.  Follow-Up: Your physician wants you to follow-up in: one year with Cristopher Peru, MD  You will receive a reminder letter in the mail two months in advance. If you don't receive a letter, please call our office to schedule the follow-up appointment.  Remote monitoring is used to monitor your Pacemaker from home. This monitoring reduces the number of office visits required to check your device to one time per year. It allows [place] to keep an eye on the functioning of your device to ensure it is working properly. You are scheduled for a device check from home on 01/10/2022. You may send your transmission at any time that day. If you have a wireless device, the transmission will be sent automatically. After your physician reviews your transmission, you will receive a postcard with your next transmission date.  Any Other Special Instructions Will Be Listed Below (If Applicable).  If you need a refill on your cardiac medications before your next appointment, please call your pharmacy.

## 2021-11-25 NOTE — Progress Notes (Signed)
HPI Ms. Markman returns today for followup. SHe is a pleasant 88 yo woman with a h/o sinus node dysfunction and is s/p PPM insertion. She has had GI bleeding and had her [place] held and then restarted. No additional bleeding. No chest pain or sob.   Allergies  Allergen Reactions   Codeine Other (See Comments)    Caused vertigo to the point of passing out     Current Outpatient Medications  Medication Sig Dispense Refill   ELIQUIS 5 MG TABS tablet Take 1 tablet (5 mg total) by mouth 2 (two) times daily. 60 tablet    furosemide (LASIX) 20 MG tablet Take 20 mg by mouth daily.     levothyroxine (SYNTHROID) 75 MCG tablet Take 75 mcg by mouth daily before breakfast.     magnesium hydroxide (MILK OF MAGNESIA) 400 MG/5ML suspension Take 15-30 mLs by mouth daily as needed for mild constipation.     meclizine (ANTIVERT) 25 MG tablet Take 25 mg by mouth as needed for dizziness.     mirtazapine (REMERON) 15 MG tablet Take 15 mg by mouth at bedtime as needed (depression).     NON FORMULARY Take 30-60 mLs by mouth See admin instructions. Prune juice- Drink 30-60 ml's by mouth as needed for constipation     omeprazole (PRILOSEC) 20 MG capsule Take 20 mg by mouth as needed (acid reflux).     polyvinyl alcohol (LIQUIFILM TEARS) 1.4 % ophthalmic solution Place 1 drop into both eyes as needed for dry eyes. 15 mL 0   pregabalin (LYRICA) 75 MG capsule Take 1 capsule (75 mg total) by mouth 2 (two) times daily. 60 capsule 0   traMADol (ULTRAM) 50 MG tablet Take 50 mg by mouth as needed for moderate pain or severe pain.     No current facility-administered medications for this visit.     Past Medical History:  Diagnosis Date   Central retinal artery occlusion of right eye 2015   lost complete vision   Edema    Hypertension    not on BP medication currently   Neuropathy    Post-surgical hypothyroidism    Pulmonary emboli (HCC)    VTE (venous thromboembolism) 12/2018   LLE DVT and PE     ROS:   All systems reviewed and negative except as noted in the HPI.   Past Surgical History:  Procedure Laterality Date   COLONOSCOPY  2019   PACEMAKER IMPLANT N/A 01/11/2021   Procedure: PACEMAKER IMPLANT;  Surgeon: Evans Lance, MD;  Location: [facility];  Service: Cardiovascular;  Laterality: N/A;   REPLACEMENT TOTAL KNEE Right 2008   TOTAL THYROIDECTOMY       History reviewed. No pertinent family history.   Social History   Socioeconomic History   Marital status: Widowed    Spouse name: Not on file   Number of children: Not on file   Years of education: Not on file   Highest education level: Not on file  Occupational History   Occupation: retired  Tobacco Use   Smoking status: Never   Smokeless tobacco: Never  Substance and Sexual Activity   Alcohol use: Never   Drug use: Never   Sexual activity: Not on file  Other Topics Concern   Not on file  Social History Narrative   ** Merged History Encounter **       Social Determinants of Health   Financial Resource Strain: Not on file  Food Insecurity: Not on  file  Transportation Needs: Not on file  Physical Activity: Not on file  Stress: Not on file  Social Connections: Not on file  Intimate Partner Violence: Not on file     BP (!) 146/74    Pulse 88    Ht 5\' 7"  (1.702 m)    Wt 198 lb (89.8 kg)    SpO2 95%    BMI 31.01 kg/m   Physical Exam:  Well appearing NAD HEENT: Unremarkable Neck:  No JVD, no thyromegally Lymphatics:  No adenopathy Back:  No CVA tenderness Lungs:  Clear HEART:  Regular rate rhythm, no murmurs, no rubs, no clicks Abd:  soft, positive bowel sounds, no organomegally, no rebound, no guarding Ext:  2 plus pulses, no edema, no cyanosis, no clubbing Skin:  No rashes no nodules Neuro:  CN II through XII intact, motor grossly intact  DEVICE  Normal device function.  See PaceArt for details.   Assess/Plan:  Sinus node dysfunction - she is stable s/p PPM  insertion. GI bleed - she has restarted her blood thinner. No additional bleeding  Coags - she will continue her eliquis. Fatigue - She is up urinating multiple times at night. I recommended she reduce her fluid intake, especially in the evening.  Carleene Overlie Caetano Oberhaus,MD

## 2021-12-06 LAB — CUP PACEART INCLINIC DEVICE CHECK
Battery Remaining Longevity: 134 mo
Battery Voltage: 3.02 V
Brady Statistic RA Percent Paced: 0.04 %
Brady Statistic RV Percent Paced: 0 %
Date Time Interrogation Session: 20230202151200
Implantable Lead Implant Date: 20220321
Implantable Lead Implant Date: 20220321
Implantable Lead Location: 753859
Implantable Lead Location: 753860
Implantable Pulse Generator Implant Date: 20220321
Lead Channel Impedance Value: 487.5 Ohm
Lead Channel Impedance Value: 562.5 Ohm
Lead Channel Pacing Threshold Amplitude: 0.5 V
Lead Channel Pacing Threshold Amplitude: 0.5 V
Lead Channel Pacing Threshold Amplitude: 0.75 V
Lead Channel Pacing Threshold Amplitude: 0.75 V
Lead Channel Pacing Threshold Pulse Width: 0.4 ms
Lead Channel Pacing Threshold Pulse Width: 0.4 ms
Lead Channel Pacing Threshold Pulse Width: 0.4 ms
Lead Channel Pacing Threshold Pulse Width: 0.4 ms
Lead Channel Sensing Intrinsic Amplitude: 12 mV
Lead Channel Sensing Intrinsic Amplitude: 3.4 mV
Lead Channel Setting Pacing Amplitude: 2 V
Lead Channel Setting Pacing Amplitude: 2.5 V
Lead Channel Setting Pacing Pulse Width: 0.4 ms
Lead Channel Setting Sensing Sensitivity: 2 mV
Pulse Gen Model: 2272
Pulse Gen Serial Number: 3910619

## 2022-01-10 ENCOUNTER — Ambulatory Visit (INDEPENDENT_AMBULATORY_CARE_PROVIDER_SITE_OTHER): Payer: Medicare Other

## 2022-01-10 DIAGNOSIS — I4430 Unspecified atrioventricular block: Secondary | ICD-10-CM | POA: Diagnosis not present

## 2022-01-10 LAB — CUP PACEART REMOTE DEVICE CHECK
Battery Remaining Longevity: 121 mo
Battery Remaining Percentage: 95 %
Battery Voltage: 3.02 V
Brady Statistic AP VP Percent: 1 %
Brady Statistic AP VS Percent: 1 %
Brady Statistic AS VP Percent: 1 %
Brady Statistic AS VS Percent: 99 %
Brady Statistic RA Percent Paced: 1 %
Brady Statistic RV Percent Paced: 1 %
Date Time Interrogation Session: 20230320020018
Implantable Lead Implant Date: 20220321
Implantable Lead Implant Date: 20220321
Implantable Lead Location: 753859
Implantable Lead Location: 753860
Implantable Pulse Generator Implant Date: 20220321
Lead Channel Impedance Value: 480 Ohm
Lead Channel Impedance Value: 560 Ohm
Lead Channel Pacing Threshold Amplitude: 0.5 V
Lead Channel Pacing Threshold Amplitude: 0.75 V
Lead Channel Pacing Threshold Pulse Width: 0.4 ms
Lead Channel Pacing Threshold Pulse Width: 0.4 ms
Lead Channel Sensing Intrinsic Amplitude: 12 mV
Lead Channel Sensing Intrinsic Amplitude: 2.3 mV
Lead Channel Setting Pacing Amplitude: 2 V
Lead Channel Setting Pacing Amplitude: 2.5 V
Lead Channel Setting Pacing Pulse Width: 0.4 ms
Lead Channel Setting Sensing Sensitivity: 2 mV
Pulse Gen Model: 2272
Pulse Gen Serial Number: 3910619

## 2022-01-13 DIAGNOSIS — H43812 Vitreous degeneration, left eye: Secondary | ICD-10-CM | POA: Diagnosis not present

## 2022-01-13 DIAGNOSIS — Z961 Presence of intraocular lens: Secondary | ICD-10-CM | POA: Diagnosis not present

## 2022-01-13 IMAGING — MR MR HEAD W/O CM
12 of 13 series · 43 of 48 positions shown · non-contrast
Comparison: CT head September 27, 2020.

CLINICAL DATA: Vertigo, central dizziness.  Difficulty walking.

EXAM:
MRI HEAD WITHOUT CONTRAST
TECHNIQUE: Multiplanar, multiecho pulse sequences of the brain and surrounding
structures were obtained without intravenous contrast.

[Series 5: DWI · axial · 3.0mm · 0.88mm/px · z∈[-79,+58]mm · 6 of 96 slices shown (1 of 4)]
[im 1/96]
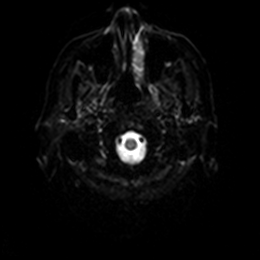
[im 20/96]
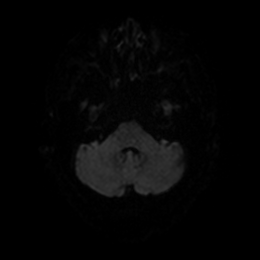
[im 39/96]
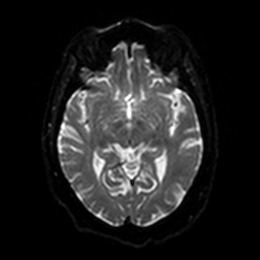
[im 58/96]
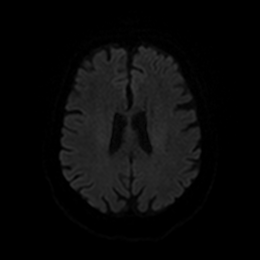
[im 77/96]
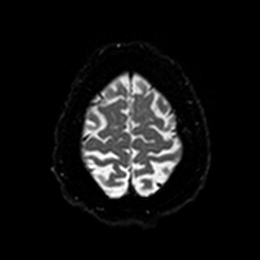
[im 96/96]
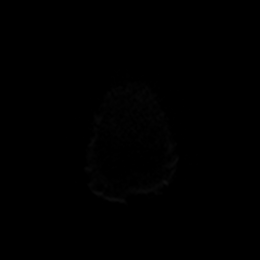

[Series 6: DWI · axial · 3.0mm · 0.88mm/px · z∈[-79,+58]mm · 3 of 48 slices shown (2 of 4)]
[im 1/48]
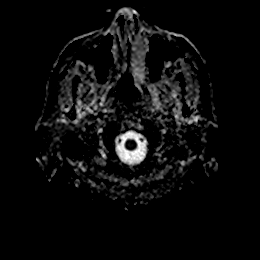
[im 24/48]
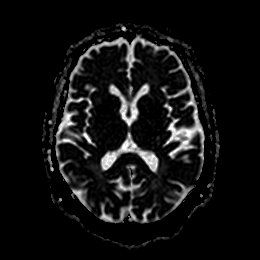
[im 48/48]
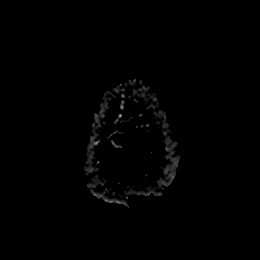

[Series 7: DWI · coronal · 4.0mm · 0.88mm/px · 5 of 66 slices shown (3 of 4)]
[im 1/66]
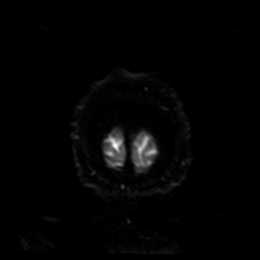
[im 17/66]
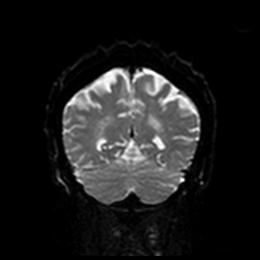
[im 33/66]
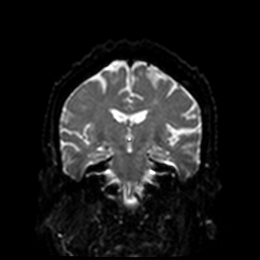
[im 49/66]
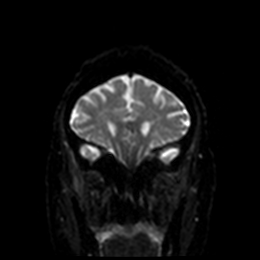
[im 66/66]
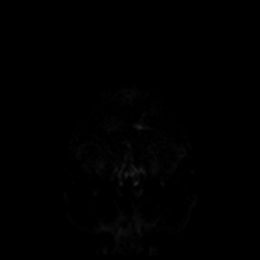

[Series 8: DWI · coronal · 4.0mm · 0.88mm/px · 2 of 33 slices shown (4 of 4)]
[im 1/33]
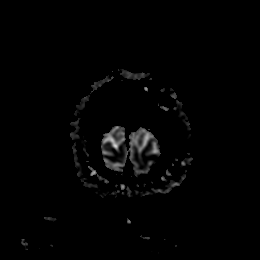
[im 33/33]
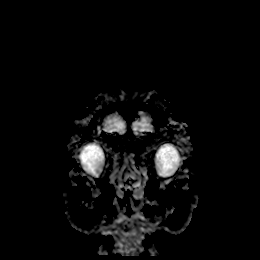

[Series 9: T1 · sagittal · 5.0mm · 0.75mm/px · 2 of 23 slices shown]
[im 1/23]
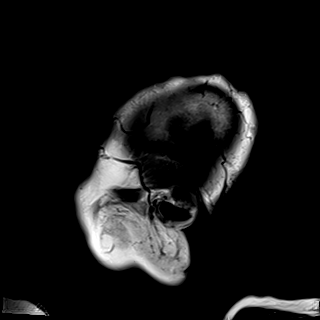
[im 23/23]
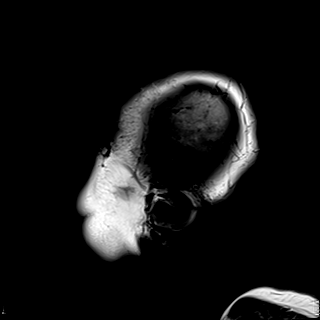

[Series 10: T2 · axial · 5.0mm · 0.72mm/px · z∈[-80,+59]mm · 2 of 25 slices shown (1 of 2)]
[im 1/25]
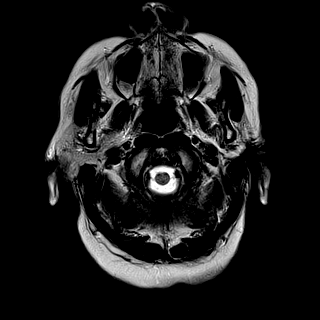
[im 25/25]
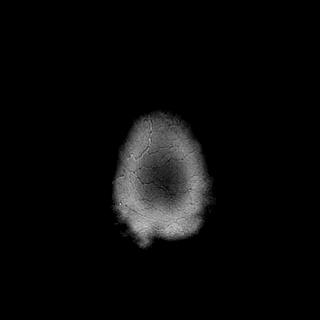

[Series 11: FLAIR · axial · 5.0mm · 0.45mm/px · z∈[-79,+60]mm · 2 of 25 slices shown]
[im 1/25]
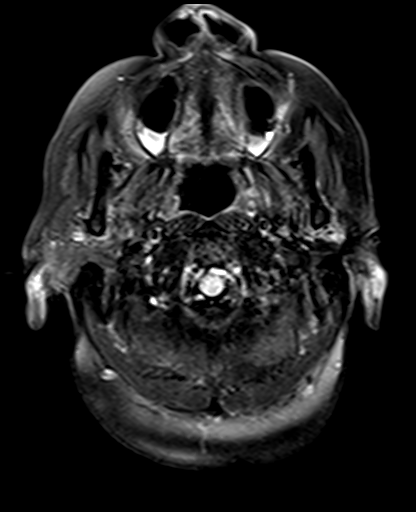
[im 25/25]
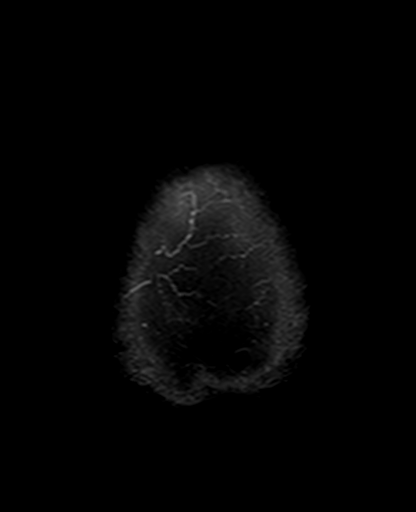

[Series 12: mag_images · axial · 3.0mm · 0.90mm/px · z∈[-95,+76]mm · 5 of 60 slices shown]
[im 1/60]
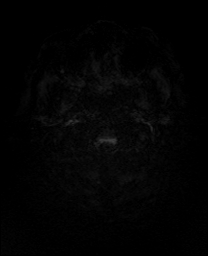
[im 15/60]
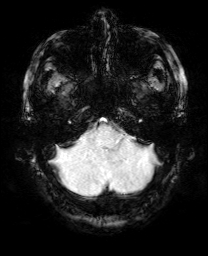
[im 30/60]
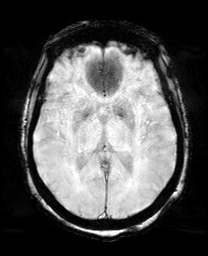
[im 45/60]
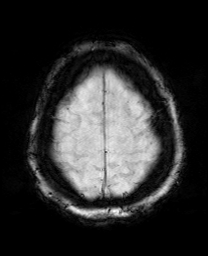
[im 60/60]
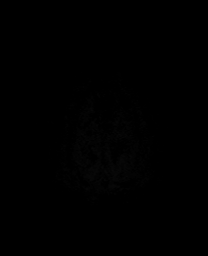

[Series 13: pha_images · axial · 3.0mm · 0.90mm/px · z∈[-95,+76]mm · 5 of 60 slices shown]
[im 1/60]
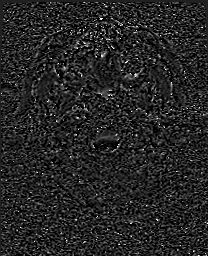
[im 15/60]
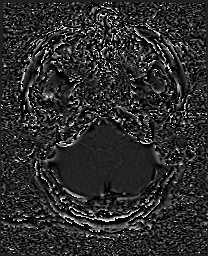
[im 30/60]
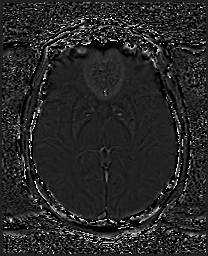
[im 45/60]
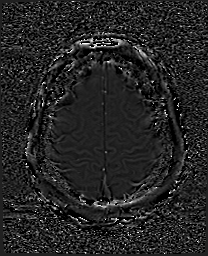
[im 60/60]
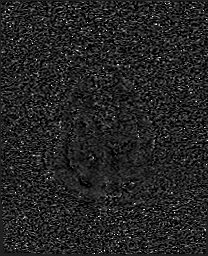

[Series 14: swi_images · axial · 3.0mm · 0.90mm/px · z∈[-95,+76]mm · 5 of 60 slices shown]
[im 1/60]
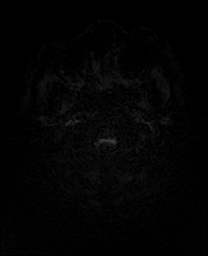
[im 15/60]
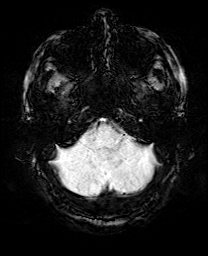
[im 30/60]
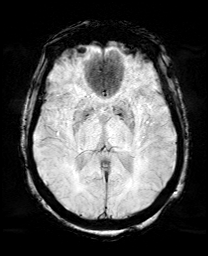
[im 45/60]
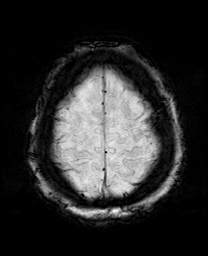
[im 60/60]
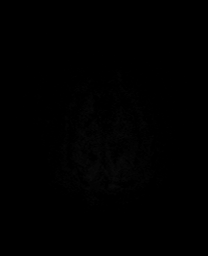

[Series 15: mip_images(sw) · axial · 24.0mm · 0.90mm/px · z∈[-85,+66]mm · 4 of 53 slices shown]
[im 1/53]
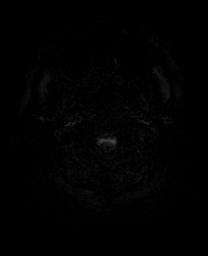
[im 18/53]
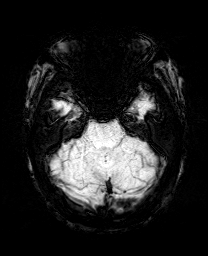
[im 35/53]
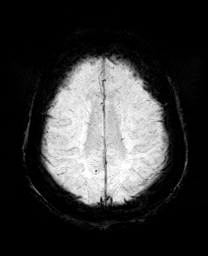
[im 53/53]
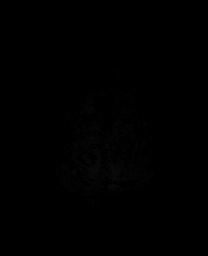

[Series 17: T2 · coronal · 5.0mm · 0.34mm/px · 2 of 29 slices shown (2 of 2)]
[im 1/29]
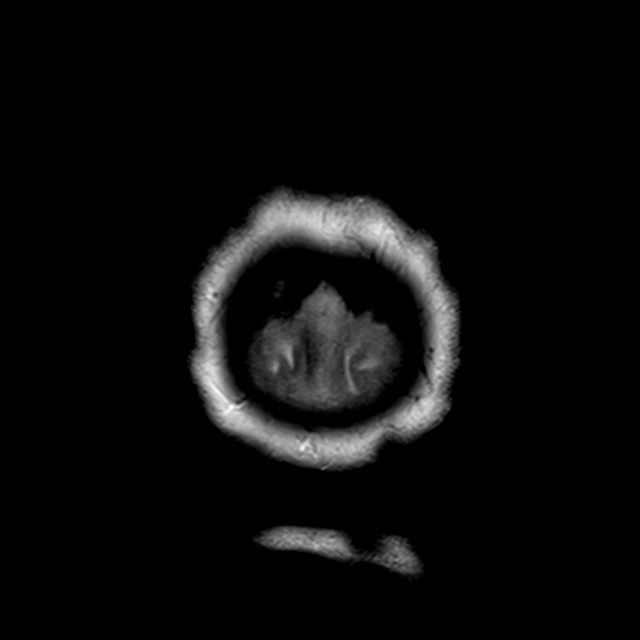
[im 29/29]
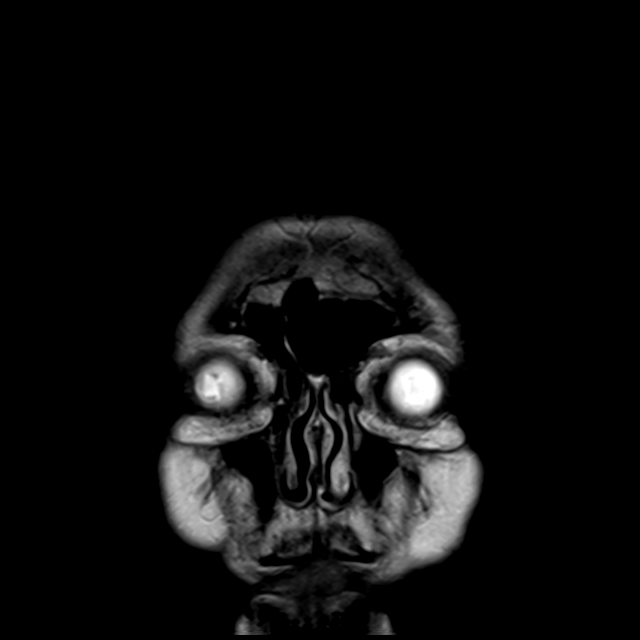

[43 of 48 positions shown; findings below may reference images not displayed]

FINDINGS: Brain: No acute infarction, hemorrhage, hydrocephalus, extra-axial
collection or mass lesion. Very mild for age scattered T2/FLAIR
hyperintensities in the white matter, most likely related to chronic
microvascular ischemic disease. Small remote right cerebellar
lacunar infarct.

Vascular: Major arterial flow voids are maintained at the skull
base.

Skull and upper cervical spine: Normal marrow signal.

Sinuses/Orbits: Clear sinuses.  Unremarkable orbits.

Other: No sizable mastoid effusion. Partially imaged posterior disc
osteophyte complex at C3-C4 which contacts and indents the ventral
cord with suspected at least moderate canal stenosis.
IMPRESSION: 1. No evidence of acute intracranial abnormality.  No acute infarct.
2. Partially imaged posterior disc osteophyte complex at C3-C4 which
contacts and indents the ventral cord with suspected at least
moderate canal stenosis. An MRI of the cervical spine could further
characterize if clinically indicated.
3. Small remote right cerebellar lacunar infarct.

## 2022-01-18 NOTE — Progress Notes (Signed)
Remote pacemaker transmission.   

## 2022-01-24 ENCOUNTER — Ambulatory Visit: Payer: Medicare Other | Admitting: Podiatry

## 2022-01-24 DIAGNOSIS — M79675 Pain in left toe(s): Secondary | ICD-10-CM | POA: Diagnosis not present

## 2022-01-24 DIAGNOSIS — M79674 Pain in right toe(s): Secondary | ICD-10-CM | POA: Diagnosis not present

## 2022-01-24 DIAGNOSIS — B353 Tinea pedis: Secondary | ICD-10-CM

## 2022-01-24 DIAGNOSIS — Z7901 Long term (current) use of anticoagulants: Secondary | ICD-10-CM | POA: Diagnosis not present

## 2022-01-24 DIAGNOSIS — B351 Tinea unguium: Secondary | ICD-10-CM

## 2022-01-24 MED ORDER — KETOCONAZOLE 2 % EX CREA: 1.0000 "application " | TOPICAL_CREAM | Freq: Every day | CUTANEOUS | 2 refills | Status: DC

## 2022-01-29 NOTE — Progress Notes (Signed)
?  Subjective:  ?Patient ID: [name], female    DOB: 05/11/1927,  MRN: [id number] ? ?Chief Complaint  ?Patient presents with  ? Nail Problem  ?  Thick painful toenails, great toenails are very painful  ? ? ?88 y.o. female presents with the above complaint. History confirmed with patient.  She is on Eliquis.  She is here with her daughter. ? ?Objective:  ?Physical Exam: ?warm, good capillary refill, no trophic changes or ulcerative lesions, normal DP and PT pulses, and normal sensory exam.  There is dry itchy peeling skin with interdigital maceration ?Left Foot: dystrophic yellowed discolored nail plates with subungual debris ?Right Foot: dystrophic yellowed discolored nail plates with subungual debris ? ?Assessment:  ? ?1. Tinea pedis of both feet   ?2. Pain due to onychomycosis of toenails of both feet   ?3. Chronic anticoagulation   ? ? ? ?Plan:  ?Patient was evaluated and treated and all questions answered. ? ?Discussed the etiology and treatment options for tinea pedis.  Discussed topical and oral treatment.  Recommended topical treatment with 2% ketoconazole cream.  This was sent to the patient's pharmacy.  Also discussed appropriate foot hygiene, use of antifungal spray such as Tinactin in shoes, as well as cleaning her foot surfaces such as showers and bathroom floors with bleach. ? ?Discussed the etiology and treatment options for the condition in detail with the patient. Educated patient on the topical and oral treatment options for mycotic nails. Recommended debridement of the nails today. Sharp and mechanical debridement performed of all painful and mycotic nails today. Nails debrided in length and thickness using a nail nipper to level of comfort. Discussed treatment options including appropriate shoe gear. Follow up as needed for painful nails. ? ? ? ?Return in about 3 months (around 04/25/2022) for at risk foot care.  ? ?

## 2022-02-01 IMAGING — CR DG CHEST 2V
2 series · 2 of 2 positions shown · non-contrast
Comparison: Radiograph 01/09/2021

CLINICAL DATA: Post pacemaker placement

EXAM:
CHEST - 2 VIEW

[chest ap]
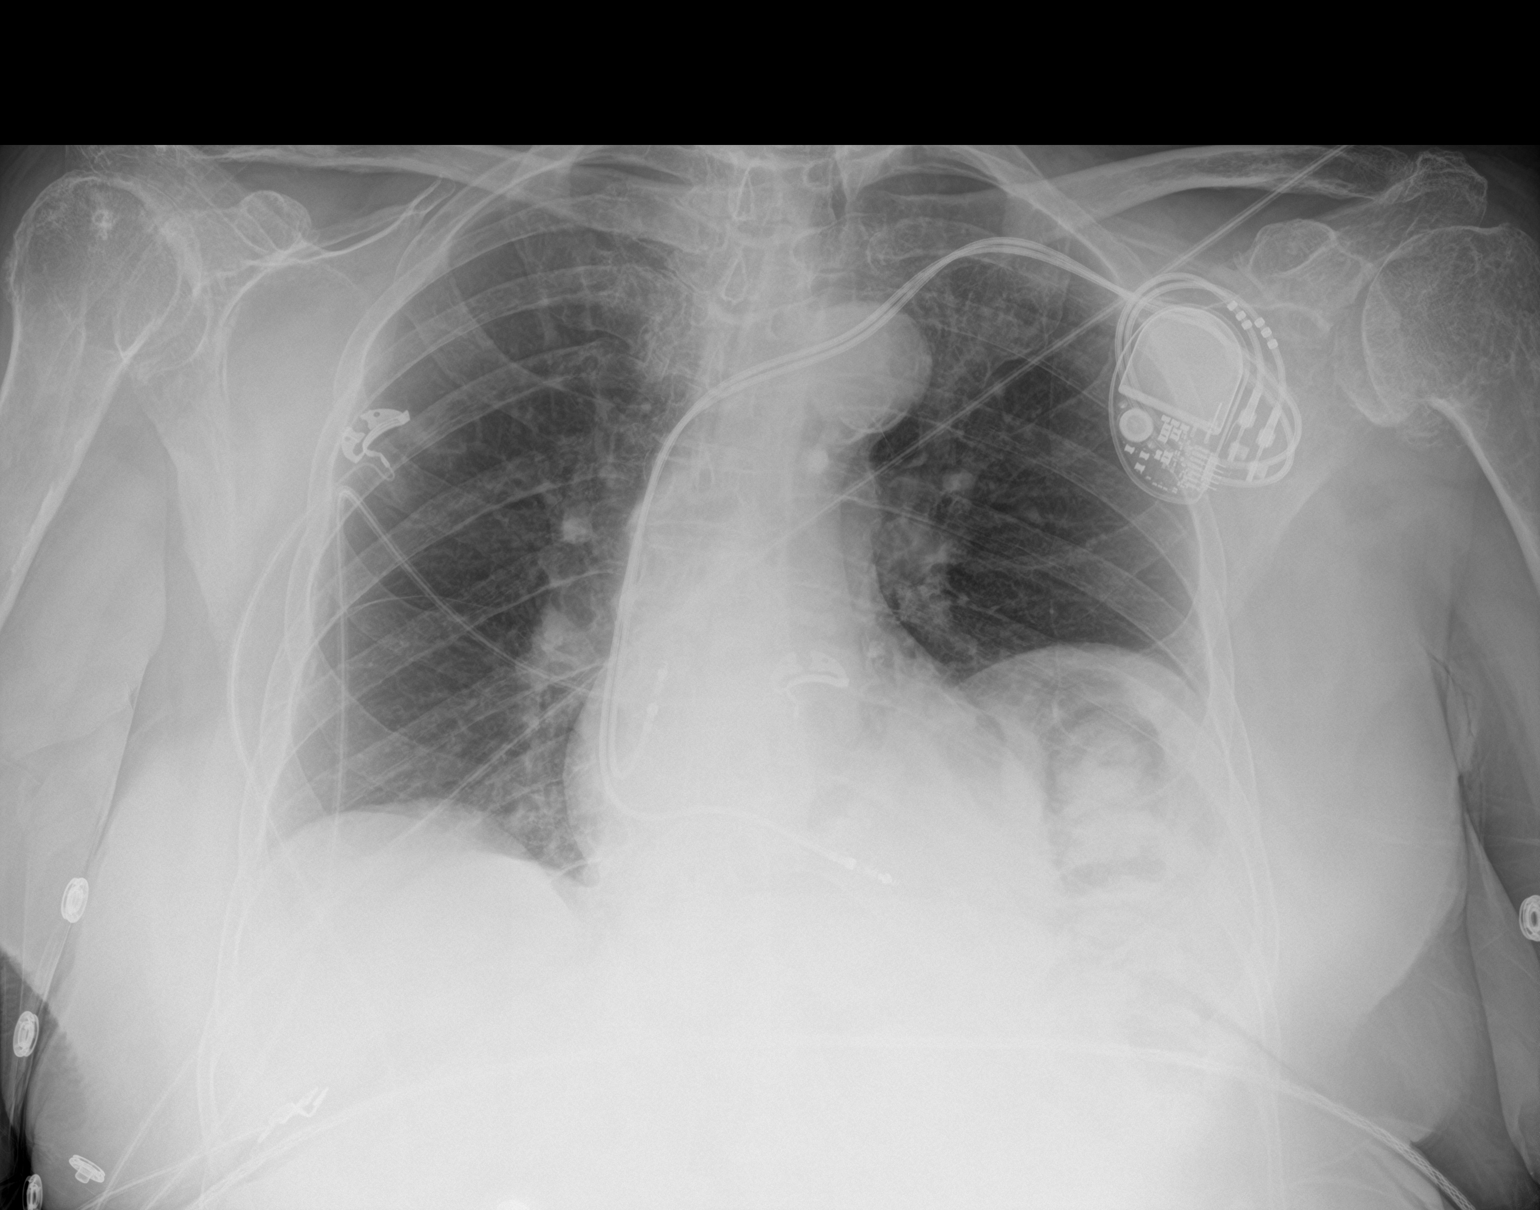

[chest lat]
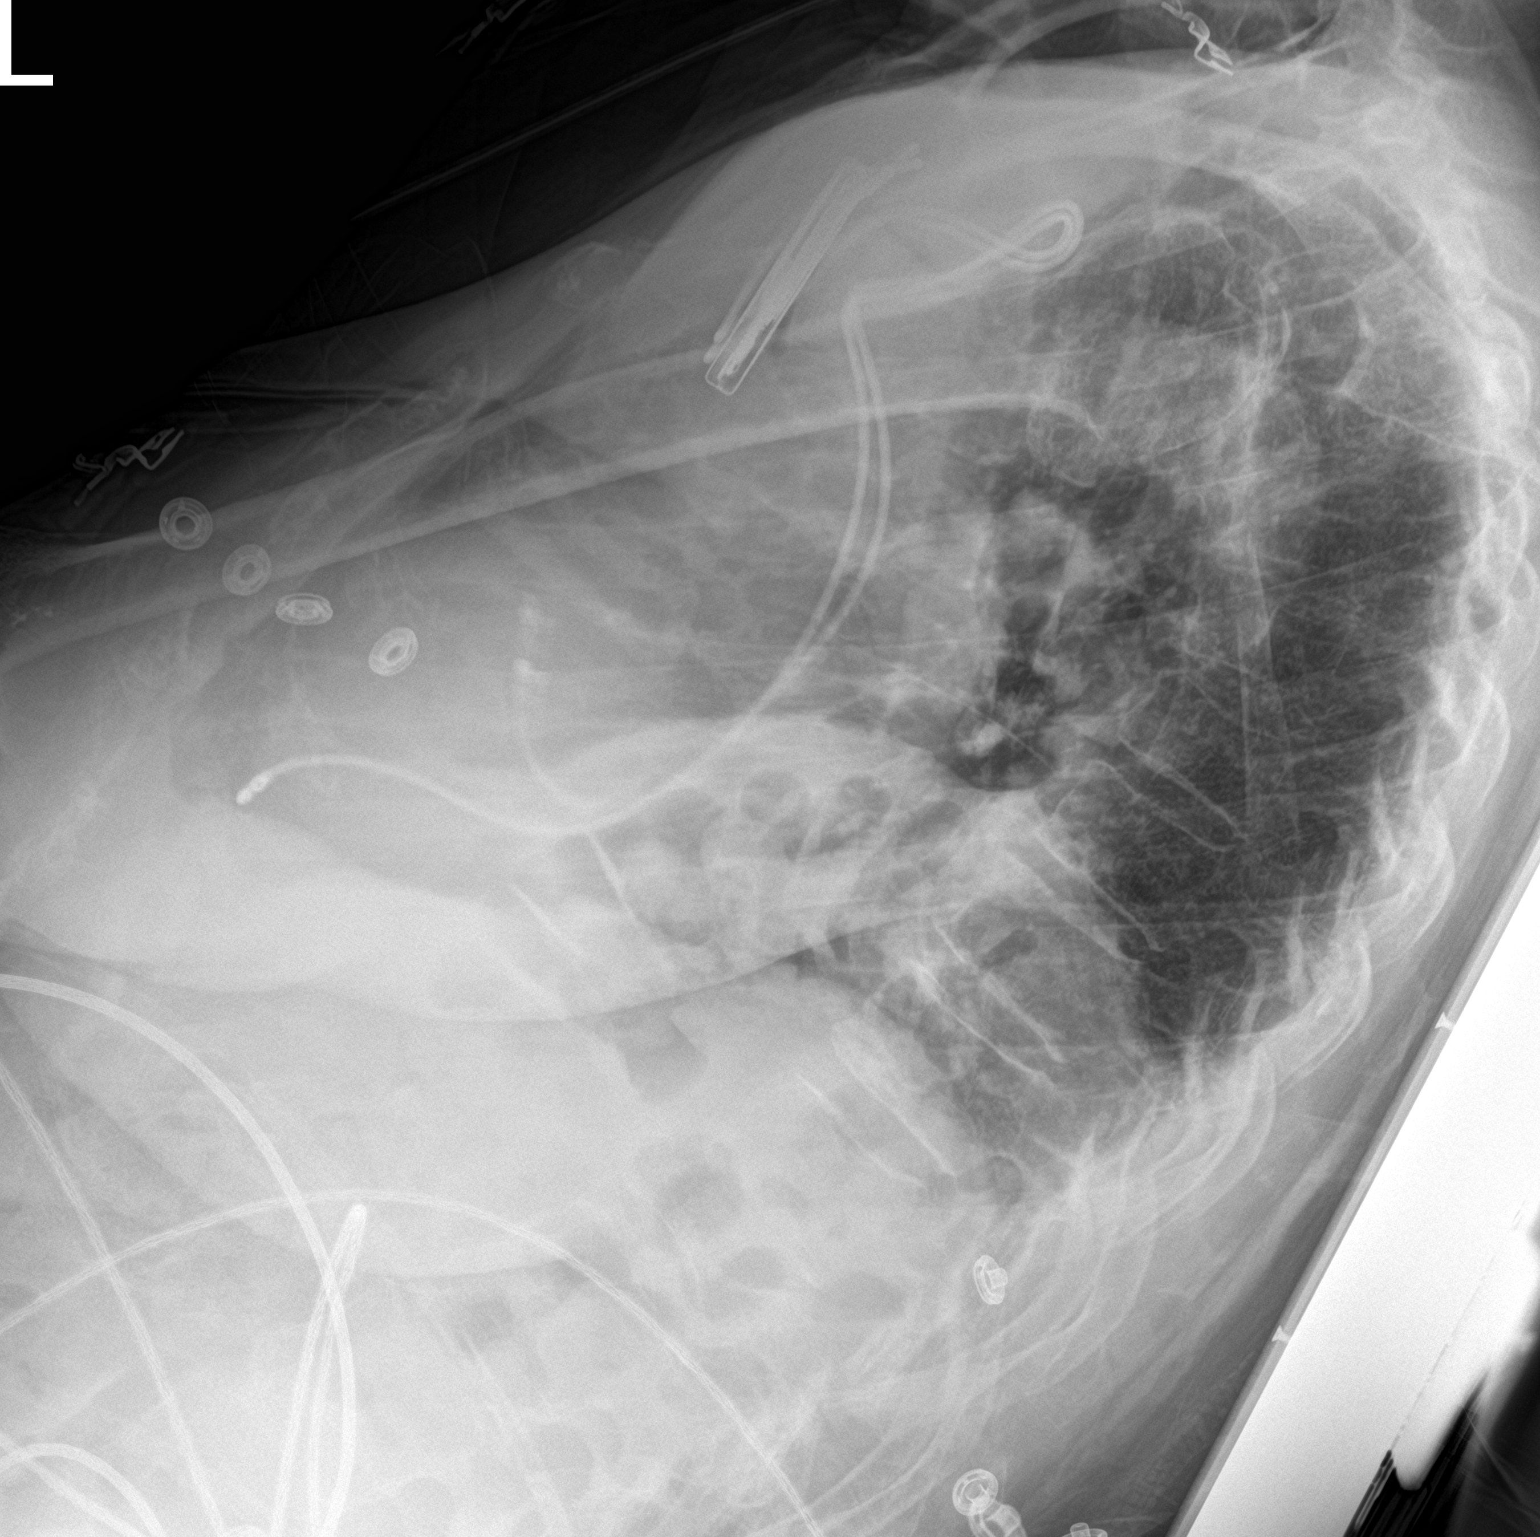

[2 of 2 positions shown; findings below may reference images not displayed]

FINDINGS: Pacemaker battery pack overlies the left chest wall. A lead at the
right atrium is appropriately rate doubled directed superiorly.
Second L directed towards the cardiac apex. Leads appear intact and
well seated. No discernible pneumothorax or visible layering
effusion. The aorta is calcified. The remaining cardiomediastinal
contours are unremarkable. Asymmetric elevation of left
hemidiaphragm is nonspecific though similar to prior exams. Low
volumes and atelectasis with some chronically coarsened interstitial
and bronchitic changes. No superimposed consolidative process or
convincing features of frank edema. Degenerative changes are present
in the imaged spine and shoulders. Telemetry leads overlie the
chest.
IMPRESSION: 1. Interval placement of a pacemaker with leads in expected position
at the right atrium and cardiac apex. No acute complication is seen.
2. Low volumes and atelectasis with chronic interstitial and
bronchitic changes. No superimposed acute cardiopulmonary process.

## 2022-03-07 ENCOUNTER — Ambulatory Visit: Payer: Medicare Other | Admitting: Podiatry

## 2022-03-07 DIAGNOSIS — L600 Ingrowing nail: Secondary | ICD-10-CM | POA: Diagnosis not present

## 2022-03-07 MED ORDER — NEOMYCIN-POLYMYXIN-HC 1 % OT SOLN: OTIC | 0 refills | Status: DC

## 2022-03-07 NOTE — Patient Instructions (Addendum)
Soak Instructions ?LEAVE BANDAGE ON FOR 48 HOURS ? ? ?TWO DAYS (Wednesday Evening) AFTER THE PROCEDURE ? ?Place 1/4 cup of epsom salts (or betadine, or white vinegar) in a quart of warm tap water.  Submerge your foot or feet with outer bandage intact for the initial soak; this will allow the bandage to become moist and wet for easy lift off.  Once you remove your bandage, continue to soak in the solution for 20 minutes.  This soak should be done twice a day.  Next, remove your foot or feet from solution, blot dry the affected area and cover.  You may use a band aid large enough to cover the area or use gauze and tape.  Apply other medications to the area as directed by the doctor such as polysporin neosporin. ? ?IF YOUR SKIN BECOMES IRRITATED WHILE USING THESE INSTRUCTIONS, IT IS OKAY TO SWITCH TO  WHITE VINEGAR AND WATER. Or you may use antibacterial soap and water to keep the toe clean ? ?Monitor for any signs/symptoms of infection. Call the office immediately if any occur or go directly to the emergency room. Call with any questions/concerns. ? ? ? ?[place] Instructions-Post Nail Surgery ? ?You have had your ingrown toenail and root treated with a chemical.  This chemical causes a burn that will drain and ooze like a blister.  This can drain for 6-8 weeks or longer.  It is important to keep this area clean, covered, and follow the soaking instructions dispensed at the time of your surgery.  This area will eventually dry and form a scab.  Once the scab forms you no longer need to soak or apply a dressing.  If at any time you experience an increase in pain, redness, swelling, or drainage, you should contact the office as soon as possible. ? ?

## 2022-03-08 NOTE — Progress Notes (Signed)
?  Subjective:  ?Patient ID: [name], female    DOB: 1926-10-25,  MRN: [id number] ? ?Chief Complaint  ?Patient presents with  ? Ingrown Toenail  ?   right foot ingrown toenail has come back  ? ? ?88 y.o. female presents with the above complaint. History confirmed with patient.  She is on Eliquis.  She is here with her daughter.  The nail still painful again on the medial side of the right great toe ? ?Objective:  ?Physical Exam: ?warm, good capillary refill, no trophic changes or ulcerative lesions, strong palpable DP and PT pulses, and normal sensory exam.  There is dry itchy peeling skin with interdigital maceration ?Left Foot: dystrophic yellowed discolored nail plates with subungual debris ?Right Foot: dystrophic yellowed discolored nail plates with subungual debris, ingrown right hallux medial border, no paronychia noted pain at the proximal border as well ? ?Assessment:  ? ?1. Ingrowing right great toenail   ? ? ? ?Plan:  ?Patient was evaluated and treated and all questions answered. ? ? ? ?Ingrown Nail, right ?-Patient elects to proceed with minor surgery to remove ingrown toenail today. Consent reviewed and signed by patient.  We discussed the risk of infection and poor wound healing.  She does have good perfusion  ?-Ingrown nail excised. See procedure note. ?-Educated on post-procedure care including soaking. Written instructions provided and reviewed. ?-Patient to follow up in 2 weeks for nail check. ? ?Procedure: Excision of Ingrown Toenail ?Location: Right 1st toe medial nail borders. ?Anesthesia: Lidocaine 1% plain; 1.5 mL and Marcaine 0.5% plain; 1.5 mL, digital block. ?Skin Prep: Betadine. ?Dressing: Silvadene; telfa; dry, sterile, compression dressing. ?Technique: Following skin prep, the toe was exsanguinated and a tourniquet was secured at the base of the toe. The affected nail border was freed, split with a nail splitter, and excised. Chemical matrixectomy was then performed with phenol and  irrigated out with alcohol. The tourniquet was then removed and sterile dressing applied. ?Disposition: Patient tolerated procedure well. Patient to return in 2 weeks for follow-up.  ? ? ? ? ?Return in about 16 days (around 03/23/2022) for nail re-check.  ? ?

## 2022-03-23 ENCOUNTER — Encounter: Payer: Self-pay | Admitting: Podiatry

## 2022-03-23 ENCOUNTER — Ambulatory Visit: Payer: Medicare Other | Admitting: Podiatry

## 2022-03-23 DIAGNOSIS — L600 Ingrowing nail: Secondary | ICD-10-CM

## 2022-03-23 DIAGNOSIS — M79674 Pain in right toe(s): Secondary | ICD-10-CM | POA: Diagnosis not present

## 2022-03-23 DIAGNOSIS — M79675 Pain in left toe(s): Secondary | ICD-10-CM

## 2022-03-23 DIAGNOSIS — B351 Tinea unguium: Secondary | ICD-10-CM | POA: Diagnosis not present

## 2022-03-23 NOTE — Progress Notes (Signed)
  Subjective:  Patient ID: [name], female    DOB: January 13, 1927,  MRN: [id number]  Chief Complaint  Patient presents with   Nail Problem    "It's doing pretty good."    88 y.o. female presents with the above complaint. History confirmed with patient.  Here for follow-up on ingrown nail removal procedure.  The remaining nails are thick and elongated again.  Overall she is doing well not having much pain Objective:  Physical Exam: warm, good capillary refill, no trophic changes or ulcerative lesions, strong palpable DP and PT pulses, and normal sensory exam.  There is dry itchy peeling skin with interdigital maceration Left Foot: dystrophic yellowed discolored nail plates with subungual debris Right Foot: dystrophic yellowed discolored nail plates with subungual debris, i hallux matricectomy site is healing well no signs of infection  Assessment:   1. Ingrowing right great toenail   2. Pain due to onychomycosis of toenails of both feet      Plan:  Patient was evaluated and treated and all questions answered.    Ingrown Nail, right -Doing well can leave toenails open to air discontinue soaks and ointments at this point discussed that is very low chance of recurrence for this.  Remaining nails were debrided as a courtesy in length and thickness.  She will return to see me on an as-needed basis.    No follow-ups on file.

## 2022-03-24 DIAGNOSIS — I519 Heart disease, unspecified: Secondary | ICD-10-CM | POA: Diagnosis not present

## 2022-03-24 DIAGNOSIS — Z7901 Long term (current) use of anticoagulants: Secondary | ICD-10-CM | POA: Diagnosis not present

## 2022-03-24 DIAGNOSIS — K219 Gastro-esophageal reflux disease without esophagitis: Secondary | ICD-10-CM | POA: Diagnosis not present

## 2022-03-24 DIAGNOSIS — I6930 Unspecified sequelae of cerebral infarction: Secondary | ICD-10-CM | POA: Diagnosis not present

## 2022-03-24 DIAGNOSIS — Z0000 Encounter for general adult medical examination without abnormal findings: Secondary | ICD-10-CM | POA: Diagnosis not present

## 2022-03-24 DIAGNOSIS — R269 Unspecified abnormalities of gait and mobility: Secondary | ICD-10-CM | POA: Diagnosis not present

## 2022-03-24 DIAGNOSIS — E039 Hypothyroidism, unspecified: Secondary | ICD-10-CM | POA: Diagnosis not present

## 2022-03-24 DIAGNOSIS — M5136 Other intervertebral disc degeneration, lumbar region: Secondary | ICD-10-CM | POA: Diagnosis not present

## 2022-04-11 ENCOUNTER — Ambulatory Visit (INDEPENDENT_AMBULATORY_CARE_PROVIDER_SITE_OTHER): Payer: Medicare Other

## 2022-04-11 DIAGNOSIS — I4430 Unspecified atrioventricular block: Secondary | ICD-10-CM

## 2022-04-11 LAB — CUP PACEART REMOTE DEVICE CHECK
Battery Remaining Longevity: 119 mo
Battery Remaining Percentage: 93 %
Battery Voltage: 3.02 V
Brady Statistic AP VP Percent: 1 %
Brady Statistic AP VS Percent: 1 %
Brady Statistic AS VP Percent: 1 %
Brady Statistic AS VS Percent: 99 %
Brady Statistic RA Percent Paced: 1 %
Brady Statistic RV Percent Paced: 1 %
Date Time Interrogation Session: 20230619020012
Implantable Lead Implant Date: 20220321
Implantable Lead Implant Date: 20220321
Implantable Lead Location: 753859
Implantable Lead Location: 753860
Implantable Pulse Generator Implant Date: 20220321
Lead Channel Impedance Value: 510 Ohm
Lead Channel Impedance Value: 580 Ohm
Lead Channel Pacing Threshold Amplitude: 0.5 V
Lead Channel Pacing Threshold Amplitude: 0.75 V
Lead Channel Pacing Threshold Pulse Width: 0.4 ms
Lead Channel Pacing Threshold Pulse Width: 0.4 ms
Lead Channel Sensing Intrinsic Amplitude: 12 mV
Lead Channel Sensing Intrinsic Amplitude: 2.6 mV
Lead Channel Setting Pacing Amplitude: 2 V
Lead Channel Setting Pacing Amplitude: 2.5 V
Lead Channel Setting Pacing Pulse Width: 0.4 ms
Lead Channel Setting Sensing Sensitivity: 2 mV
Pulse Gen Model: 2272
Pulse Gen Serial Number: 3910619

## 2022-04-28 ENCOUNTER — Ambulatory Visit: Payer: Medicare Other | Admitting: Podiatry

## 2022-04-28 NOTE — Progress Notes (Signed)
Remote pacemaker transmission.   

## 2022-05-06 DIAGNOSIS — M542 Cervicalgia: Secondary | ICD-10-CM | POA: Diagnosis not present

## 2022-06-16 DIAGNOSIS — R5383 Other fatigue: Secondary | ICD-10-CM | POA: Diagnosis not present

## 2022-06-16 DIAGNOSIS — J069 Acute upper respiratory infection, unspecified: Secondary | ICD-10-CM | POA: Diagnosis not present

## 2022-06-16 DIAGNOSIS — R519 Headache, unspecified: Secondary | ICD-10-CM | POA: Diagnosis not present

## 2022-06-16 DIAGNOSIS — J45901 Unspecified asthma with (acute) exacerbation: Secondary | ICD-10-CM | POA: Diagnosis not present

## 2022-06-16 DIAGNOSIS — R0602 Shortness of breath: Secondary | ICD-10-CM | POA: Diagnosis not present

## 2022-06-16 DIAGNOSIS — J9811 Atelectasis: Secondary | ICD-10-CM | POA: Diagnosis not present

## 2022-06-16 DIAGNOSIS — I4510 Unspecified right bundle-branch block: Secondary | ICD-10-CM | POA: Diagnosis not present

## 2022-06-16 DIAGNOSIS — Z20822 Contact with and (suspected) exposure to covid-19: Secondary | ICD-10-CM | POA: Diagnosis not present

## 2022-06-17 DIAGNOSIS — R519 Headache, unspecified: Secondary | ICD-10-CM | POA: Diagnosis not present

## 2022-06-17 DIAGNOSIS — I4510 Unspecified right bundle-branch block: Secondary | ICD-10-CM | POA: Diagnosis not present

## 2022-07-08 DIAGNOSIS — H5440 Blindness, one eye, unspecified eye: Secondary | ICD-10-CM | POA: Diagnosis not present

## 2022-07-08 DIAGNOSIS — I69398 Other sequelae of cerebral infarction: Secondary | ICD-10-CM | POA: Diagnosis not present

## 2022-07-08 DIAGNOSIS — Z950 Presence of cardiac pacemaker: Secondary | ICD-10-CM | POA: Diagnosis not present

## 2022-07-08 DIAGNOSIS — M542 Cervicalgia: Secondary | ICD-10-CM | POA: Diagnosis not present

## 2022-07-08 DIAGNOSIS — Z86711 Personal history of pulmonary embolism: Secondary | ICD-10-CM | POA: Diagnosis not present

## 2022-07-08 DIAGNOSIS — F32A Depression, unspecified: Secondary | ICD-10-CM | POA: Diagnosis not present

## 2022-07-08 DIAGNOSIS — E039 Hypothyroidism, unspecified: Secondary | ICD-10-CM | POA: Diagnosis not present

## 2022-07-08 DIAGNOSIS — M5410 Radiculopathy, site unspecified: Secondary | ICD-10-CM | POA: Diagnosis not present

## 2022-07-08 DIAGNOSIS — M519 Unspecified thoracic, thoracolumbar and lumbosacral intervertebral disc disorder: Secondary | ICD-10-CM | POA: Diagnosis not present

## 2022-07-08 DIAGNOSIS — Z9181 History of falling: Secondary | ICD-10-CM | POA: Diagnosis not present

## 2022-07-08 DIAGNOSIS — M4854XD Collapsed vertebra, not elsewhere classified, thoracic region, subsequent encounter for fracture with routine healing: Secondary | ICD-10-CM | POA: Diagnosis not present

## 2022-07-08 DIAGNOSIS — K219 Gastro-esophageal reflux disease without esophagitis: Secondary | ICD-10-CM | POA: Diagnosis not present

## 2022-07-08 DIAGNOSIS — Z7901 Long term (current) use of anticoagulants: Secondary | ICD-10-CM | POA: Diagnosis not present

## 2022-07-08 DIAGNOSIS — M4800 Spinal stenosis, site unspecified: Secondary | ICD-10-CM | POA: Diagnosis not present

## 2022-07-08 DIAGNOSIS — K5900 Constipation, unspecified: Secondary | ICD-10-CM | POA: Diagnosis not present

## 2022-07-08 DIAGNOSIS — I441 Atrioventricular block, second degree: Secondary | ICD-10-CM | POA: Diagnosis not present

## 2022-07-11 ENCOUNTER — Ambulatory Visit (INDEPENDENT_AMBULATORY_CARE_PROVIDER_SITE_OTHER): Payer: Medicare Other

## 2022-07-11 DIAGNOSIS — I4430 Unspecified atrioventricular block: Secondary | ICD-10-CM

## 2022-07-12 LAB — CUP PACEART REMOTE DEVICE CHECK
Battery Remaining Longevity: 115 mo
Battery Remaining Percentage: 91 %
Battery Voltage: 3.02 V
Brady Statistic AP VP Percent: 1 %
Brady Statistic AP VS Percent: 1 %
Brady Statistic AS VP Percent: 1 %
Brady Statistic AS VS Percent: 99 %
Brady Statistic RA Percent Paced: 1 %
Brady Statistic RV Percent Paced: 1 %
Date Time Interrogation Session: 20230918020028
Implantable Lead Implant Date: 20220321
Implantable Lead Implant Date: 20220321
Implantable Lead Location: 753859
Implantable Lead Location: 753860
Implantable Pulse Generator Implant Date: 20220321
Lead Channel Impedance Value: 460 Ohm
Lead Channel Impedance Value: 550 Ohm
Lead Channel Pacing Threshold Amplitude: 0.5 V
Lead Channel Pacing Threshold Amplitude: 0.75 V
Lead Channel Pacing Threshold Pulse Width: 0.4 ms
Lead Channel Pacing Threshold Pulse Width: 0.4 ms
Lead Channel Sensing Intrinsic Amplitude: 12 mV
Lead Channel Sensing Intrinsic Amplitude: 2.4 mV
Lead Channel Setting Pacing Amplitude: 2 V
Lead Channel Setting Pacing Amplitude: 2.5 V
Lead Channel Setting Pacing Pulse Width: 0.4 ms
Lead Channel Setting Sensing Sensitivity: 2 mV
Pulse Gen Model: 2272
Pulse Gen Serial Number: 3910619

## 2022-07-20 DIAGNOSIS — M4800 Spinal stenosis, site unspecified: Secondary | ICD-10-CM | POA: Diagnosis not present

## 2022-07-20 DIAGNOSIS — M5410 Radiculopathy, site unspecified: Secondary | ICD-10-CM | POA: Diagnosis not present

## 2022-07-20 DIAGNOSIS — M519 Unspecified thoracic, thoracolumbar and lumbosacral intervertebral disc disorder: Secondary | ICD-10-CM | POA: Diagnosis not present

## 2022-07-20 DIAGNOSIS — M542 Cervicalgia: Secondary | ICD-10-CM | POA: Diagnosis not present

## 2022-07-22 NOTE — Progress Notes (Signed)
Remote pacemaker transmission.   

## 2022-07-23 DIAGNOSIS — K219 Gastro-esophageal reflux disease without esophagitis: Secondary | ICD-10-CM | POA: Diagnosis not present

## 2022-07-23 DIAGNOSIS — E039 Hypothyroidism, unspecified: Secondary | ICD-10-CM | POA: Diagnosis not present

## 2022-07-24 NOTE — Progress Notes (Deleted)
Cardiology Office Note Date:  07/24/2022  Patient ID:  Crystal, [name] [birth year], MRN [id number] PCP:  [name], DO  Electrophysiologist: Dr. Lovena Le  ***refresh   Chief Complaint: *** CP  History of Present Illness: [name] is a 88 y.o. 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  She saw Dr. Lovena Le 11/25/21, she had been off her [place] temporarily 2/2 GIB but restarted.  C/o fatigue but up several times a night to urinate, advised to cut back on fluids some.  ER visit at [facility] 06/16/22, c/o weakness, shortness of breath, sharp pain in the head behind her ear, left arm and neck and swollen legs. Extensive labs and imaging eval done, suspect viral URI, given tylenol duoneb and discharged from the ER   *** ? CP *** symptoms, ER, PMD *** eliquis, dose, labs, bleeding   Device information Abbott dual chamber PPM implanted 01/11/2021   Past Medical History:  Diagnosis Date   Central retinal artery occlusion of right eye 2015   lost complete vision   Edema    Hypertension    not on BP medication currently   Neuropathy    Post-surgical hypothyroidism    Pulmonary emboli (HCC)    VTE (venous thromboembolism) 12/2018   LLE DVT and PE    Past Surgical History:  Procedure Laterality Date   COLONOSCOPY  2019   PACEMAKER IMPLANT N/A 01/11/2021   Procedure: PACEMAKER IMPLANT;  Surgeon: Evans Lance, MD;  Location: [facility];  Service: Cardiovascular;  Laterality: N/A;   REPLACEMENT TOTAL KNEE Right 2008   TOTAL THYROIDECTOMY      Current Outpatient Medications  Medication Sig Dispense Refill   ELIQUIS 5 MG TABS tablet Take 1 tablet (5 mg total) by mouth 2 (two) times daily. 60 tablet    furosemide (LASIX) 20 MG tablet Take 20 mg by mouth daily.     ketoconazole (NIZORAL) 2 % cream Apply 1 application. topically daily. 60 g 2   levothyroxine (SYNTHROID) 75 MCG tablet Take 75 mcg  by mouth daily before breakfast.     magnesium hydroxide (MILK OF MAGNESIA) 400 MG/5ML suspension Take 15-30 mLs by mouth daily as needed for mild constipation.     meclizine (ANTIVERT) 25 MG tablet Take 25 mg by mouth as needed for dizziness.     mirtazapine (REMERON) 15 MG tablet Take 15 mg by mouth at bedtime as needed (depression).     NEOMYCIN-POLYMYXIN-HYDROCORTISONE (CORTISPORIN) 1 % SOLN OTIC solution Apply to nail beds from procedure site twice daily after soaks 10 mL 0   NON FORMULARY Take 30-60 mLs by mouth See admin instructions. Prune juice- Drink 30-60 ml's by mouth as needed for constipation     omeprazole (PRILOSEC) 20 MG capsule Take 20 mg by mouth as needed (acid reflux).     polyvinyl alcohol (LIQUIFILM TEARS) 1.4 % ophthalmic solution Place 1 drop into both eyes as needed for dry eyes. 15 mL 0   pregabalin (LYRICA) 75 MG capsule Take 1 capsule (75 mg total) by mouth 2 (two) times daily. 60 capsule 0   traMADol (ULTRAM) 50 MG tablet Take 50 mg by mouth as needed for moderate pain or severe pain.     No current facility-administered medications for this visit.    Allergies:   Codeine   Social History:  The patient  reports that she has never smoked. She has never used smokeless tobacco.  She reports that she does not drink alcohol and does not use drugs.   Family History:  The patient's family history is not on file.  ROS:  Please see the history of present illness.    All other systems are reviewed and otherwise negative.   PHYSICAL EXAM:  VS:  There were no vitals taken for this visit. BMI: There is no height or weight on file to calculate BMI. Well nourished, well developed, in no acute distress HEENT: normocephalic, atraumatic Neck: no JVD, carotid bruits or masses Cardiac:  *** RRR; no significant murmurs, no rubs, or gallops Lungs:  *** CTA b/l, no wheezing, rhonchi or rales Abd: soft, nontender MS: no deformity or *** atrophy Ext: *** no edema Skin: warm and  dry, no rash Neuro:  No gross deficits appreciated Psych: euthymic mood, full affect  *** PPM site is stable, no tethering or discomfort   EKG:  Done today and reviewed by myself shows  ***  Device interrogation done today and reviewed by myself:  ***   01/10/2021: TTE  1. Left ventricular ejection fraction, by estimation, is 65 to 70%. The  left ventricle has normal function. The left ventricle has no regional  wall motion abnormalities. Left ventricular diastolic parameters are  consistent with Grade I diastolic  dysfunction (impaired relaxation). Elevated left atrial pressure.   2. Right ventricular systolic function is normal. The right ventricular  size is normal. There is normal pulmonary artery systolic pressure.   3. Left atrial size was mildly dilated.   4. The mitral valve is normal in structure. No evidence of mitral valve  regurgitation. No evidence of mitral stenosis. Moderate mitral annular  calcification.   5. The aortic valve is normal in structure. Aortic valve regurgitation is  not visualized. Moderate aortic valve stenosis. Aortic valve mean gradient  measures 6.0 mmHg.   6. The inferior vena cava is normal in size with greater than 50%  respiratory variability, suggesting right atrial pressure of 3 mmHg.    Recent Labs: 11/21/2021: BUN 9; Creatinine, Ser 0.75; Hemoglobin 10.8; Platelets 141; Potassium 3.7; Sodium 141  No results found for requested labs within last 365 days.   CrCl cannot be calculated (Patient's most recent lab result is older than the maximum 21 days allowed.).   Wt Readings from Last 3 Encounters:  11/25/21 198 lb (89.8 kg)  11/21/21 199 lb (90.3 kg)  05/24/21 206 lb (93.4 kg)     Other studies reviewed: Additional studies/records reviewed today include: summarized above  ASSESSMENT AND PLAN:  PPM ***  HTN ***  CP ***  Disposition: F/u with ***  Current medicines are reviewed at length with the patient today.  The  patient did not have any concerns regarding medicines.  Venetia Night, PA-C 07/24/2022 1:32 PM     [facility] [place] [address] [place] [address] [phone] (office)  [phone] (fax)

## 2022-07-25 ENCOUNTER — Other Ambulatory Visit: Payer: Self-pay

## 2022-07-25 ENCOUNTER — Encounter (HOSPITAL_COMMUNITY): Payer: Self-pay

## 2022-07-25 ENCOUNTER — Emergency Department (HOSPITAL_COMMUNITY): Payer: Medicare Other

## 2022-07-25 ENCOUNTER — Emergency Department (HOSPITAL_COMMUNITY)
Admission: EM | Admit: 2022-07-25 | Discharge: 2022-07-25 | Disposition: A | Discharge status: Home / Self Care | Payer: Medicare Other | Attending: Emergency Medicine | Admitting: Emergency Medicine

## 2022-07-25 DIAGNOSIS — R202 Paresthesia of skin: Secondary | ICD-10-CM | POA: Diagnosis not present

## 2022-07-25 DIAGNOSIS — Z7901 Long term (current) use of anticoagulants: Secondary | ICD-10-CM | POA: Insufficient documentation

## 2022-07-25 DIAGNOSIS — Z950 Presence of cardiac pacemaker: Secondary | ICD-10-CM | POA: Insufficient documentation

## 2022-07-25 DIAGNOSIS — H6123 Impacted cerumen, bilateral: Secondary | ICD-10-CM | POA: Diagnosis not present

## 2022-07-25 DIAGNOSIS — R0902 Hypoxemia: Secondary | ICD-10-CM | POA: Diagnosis not present

## 2022-07-25 DIAGNOSIS — R110 Nausea: Secondary | ICD-10-CM | POA: Diagnosis not present

## 2022-07-25 DIAGNOSIS — R519 Headache, unspecified: Secondary | ICD-10-CM | POA: Diagnosis not present

## 2022-07-25 DIAGNOSIS — L299 Pruritus, unspecified: Secondary | ICD-10-CM | POA: Diagnosis not present

## 2022-07-25 DIAGNOSIS — R200 Anesthesia of skin: Secondary | ICD-10-CM | POA: Insufficient documentation

## 2022-07-25 DIAGNOSIS — Z96651 Presence of right artificial knee joint: Secondary | ICD-10-CM | POA: Insufficient documentation

## 2022-07-25 LAB — CBC WITH DIFFERENTIAL/PLATELET
Abs Immature Granulocytes: 0.01 10*3/uL (ref 0.00–0.07)
Basophils Absolute: 0 10*3/uL (ref 0.0–0.1)
Basophils Relative: 1 %
Eosinophils Absolute: 0 10*3/uL (ref 0.0–0.5)
Eosinophils Relative: 1 %
HCT: 36.6 % (ref 36.0–46.0)
Hemoglobin: 12.1 g/dL (ref 12.0–15.0)
Immature Granulocytes: 0 %
Lymphocytes Relative: 31 %
Lymphs Abs: 1.3 10*3/uL (ref 0.7–4.0)
MCH: 27.6 pg (ref 26.0–34.0)
MCHC: 33.1 g/dL (ref 30.0–36.0)
MCV: 83.6 fL (ref 80.0–100.0)
Monocytes Absolute: 0.4 10*3/uL (ref 0.1–1.0)
Monocytes Relative: 9 %
Neutro Abs: 2.5 10*3/uL (ref 1.7–7.7)
Neutrophils Relative %: 58 %
Platelets: 161 10*3/uL (ref 150–400)
RBC: 4.38 MIL/uL (ref 3.87–5.11)
RDW: 15.6 % — ABNORMAL HIGH (ref 11.5–15.5)
WBC: 4.3 10*3/uL (ref 4.0–10.5)
nRBC: 0 % (ref 0.0–0.2)

## 2022-07-25 LAB — COMPREHENSIVE METABOLIC PANEL
ALT: 11 U/L (ref 0–44)
AST: 18 U/L (ref 15–41)
Albumin: 3.5 g/dL (ref 3.5–5.0)
Alkaline Phosphatase: 99 U/L (ref 38–126)
Anion gap: 5 (ref 5–15)
BUN: 9 mg/dL (ref 8–23)
CO2: 26 mmol/L (ref 22–32)
Calcium: 8.9 mg/dL (ref 8.9–10.3)
Chloride: 108 mmol/L (ref 98–111)
Creatinine, Ser: 0.85 mg/dL (ref 0.44–1.00)
GFR, Estimated: >60 mL/min (ref >60)
Glucose, Bld: 101 mg/dL — ABNORMAL HIGH (ref 70–99)
Potassium: 4 mmol/L (ref 3.5–5.1)
Sodium: 139 mmol/L (ref 135–145)
Total Bilirubin: 0.6 mg/dL (ref 0.3–1.2)
Total Protein: 7.1 g/dL (ref 6.5–8.1)

## 2022-07-25 MED ORDER — KETOROLAC TROMETHAMINE 15 MG/ML IJ SOLN
7.5000 mg | Freq: Once | INTRAMUSCULAR | Status: AC
  Administered 2022-07-25: 7.5 mg via INTRAVENOUS
  Filled 2022-07-25: qty 1

## 2022-07-25 MED ORDER — ACETAMINOPHEN 325 MG PO TABS
650.0000 mg | ORAL_TABLET | Freq: Four times a day (QID) | ORAL | Status: DC | PRN
  Administered 2022-07-25: 650 mg via ORAL
  Filled 2022-07-25: qty 2

## 2022-07-25 MED ORDER — PROCHLORPERAZINE EDISYLATE 10 MG/2ML IJ SOLN
5.0000 mg | Freq: Once | INTRAMUSCULAR | Status: AC
  Administered 2022-07-25: 5 mg via INTRAVENOUS
  Filled 2022-07-25: qty 2

## 2022-07-25 NOTE — ED Provider Notes (Signed)
[facility] EMERGENCY DEPARTMENT Provider Note   CSN: [id number] Arrival date & time: 07/25/22  1138     History  Chief Complaint  Patient presents with   Headache   Ear Drainage    [name] is a 88 y.o. female.   Headache Ear Drainage Associated symptoms include headaches.  Has headache for last few days.  Reportedly back in the left side of her head.  Dull.  Somewhat severe.  No vision changes also states difficulty hearing on the left ear.  States for years since she had a surgery what sounds like on her parotid gland on the left has had drainage behind her left ear when she eats.  States that now there is some drainage on the inside of the ear.  No trauma.  States left side of face also feels numb.  No vision changes.  No relief with her medicines at home.  Patient is on Eliquis.    Past Surgical History:  Procedure Laterality Date   COLONOSCOPY  2019   PACEMAKER IMPLANT N/A 01/11/2021   Procedure: PACEMAKER IMPLANT;  Surgeon: Evans Lance, MD;  Location: [facility];  Service: Cardiovascular;  Laterality: N/A;   REPLACEMENT TOTAL KNEE Right 2008   TOTAL THYROIDECTOMY      Home Medications Prior to Admission medications   Medication Sig Start Date End Date Taking? Authorizing Provider  ELIQUIS 5 MG TABS tablet Take 1 tablet (5 mg total) by mouth 2 (two) times daily. 04/19/21  Yes Enzo Bi, MD  furosemide (LASIX) 20 MG tablet Take 20 mg by mouth daily.   Yes [provider]  levothyroxine (SYNTHROID) 75 MCG tablet Take 75 mcg by mouth daily before breakfast.   Yes [provider]  magnesium hydroxide (MILK OF MAGNESIA) 400 MG/5ML suspension Take 15-30 mLs by mouth daily as needed for mild constipation.   Yes [provider]  meclizine (ANTIVERT) 25 MG tablet Take 25 mg by mouth as needed for dizziness.   Yes [provider]  mirtazapine (REMERON) 15 MG tablet Take 15 mg by mouth at bedtime as needed  (depression).   Yes [provider]  NON FORMULARY Take 30-60 mLs by mouth See admin instructions. Prune juice- Drink 30-60 ml's by mouth as needed for constipation   Yes [provider]  omeprazole (PRILOSEC) 20 MG capsule Take 20 mg by mouth as needed (acid reflux).   Yes [provider]  pregabalin (LYRICA) 75 MG capsule Take 1 capsule (75 mg total) by mouth 2 (two) times daily. 10/05/20  Yes Mercy Riding, MD  traMADol (ULTRAM) 50 MG tablet Take 50 mg by mouth as needed for moderate pain or severe pain.   Yes [provider]  ketoconazole (NIZORAL) 2 % cream Apply 1 application. topically daily. 01/24/22   McDonald, Stephan Minister, DPM  NEOMYCIN-POLYMYXIN-HYDROCORTISONE (CORTISPORIN) 1 % SOLN OTIC solution Apply to nail beds from procedure site twice daily after soaks 03/07/22   McDonald, Stephan Minister, DPM  polyvinyl alcohol (LIQUIFILM TEARS) 1.4 % ophthalmic solution Place 1 drop into both eyes as needed for dry eyes. Patient taking differently: Place 1 drop into the left eye as needed (for blurry vision). 10/05/20   Mercy Riding, MD  hydrochlorothiazide (MICROZIDE) 12.5 MG capsule Take 1 capsule (12.5 mg total) by mouth daily. Patient not taking: No sig reported 10/05/20 04/12/21  Mercy Riding, MD      Allergies    Codeine    Review of  Systems   Review of Systems  Neurological:  Positive for headaches.    Physical Exam Updated Vital Signs BP (!) 177/85 (BP Location: Right Arm)   Pulse 81   Temp 98.7 F (37.1 C) (Oral)   Resp 18   Ht '5\' 7"'$  (1.702 m)   Wt 89.8 kg   SpO2 91%   BMI 31.01 kg/m  Physical Exam Vitals and nursing note reviewed.  HENT:     Head:     Comments: Bilateral TMs obscured.  After irrigation right TM reassuring.  Left TM also does not show infection but does have some bleeding on the anterior lower aspect of the canal. Cardiovascular:     Rate and Rhythm: Regular rhythm.  Pulmonary:     Breath sounds: No wheezing or rhonchi.   Abdominal:     General: There is no distension.  Musculoskeletal:     Cervical back: Neck supple.  Skin:    General: Skin is warm.  Neurological:     Mental Status: She is alert.     Comments: Paresthesias to left side of face.  Good grip strength bilaterally.  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  Left eye movements intact.  Psychiatric:        Mood and Affect: Mood normal.        Behavior: Behavior normal.     ED Results / Procedures / Treatments   Labs (all labs ordered are listed, but only abnormal results are displayed) Labs Reviewed  COMPREHENSIVE METABOLIC PANEL - Abnormal; Notable for the following components:      Result Value   Glucose, Bld 101 (*)    All other components within normal limits  CBC WITH DIFFERENTIAL/PLATELET - Abnormal; Notable for the following components:   RDW 15.6 (*)    All other components within normal limits    EKG None  Radiology CT Head Wo Contrast  Result Date: 07/25/2022 CLINICAL DATA:  Left-sided headache.  Ear drainage.  Nausea. EXAM: CT HEAD WITHOUT CONTRAST TECHNIQUE: Contiguous axial images were obtained from the base of the skull through the vertex without intravenous contrast. RADIATION DOSE REDUCTION: This exam was performed according to the departmental dose-optimization program which includes automated exposure control, adjustment of the mA and/or kV according to patient size and/or use of iterative reconstruction technique. COMPARISON:  CT head June 17, 2022 FINDINGS: Brain: No evidence of acute infarction, hemorrhage, hydrocephalus, extra-axial collection or mass lesion/mass effect. Mineralization in the left basal ganglia. Vascular: No hyperdense vessel or unexpected calcification. Skull: Normal. Negative for fracture or focal lesion. Sinuses/Orbits: No acute finding. Bilateral lens replacements. Asymmetric atrophy of the parotid gland Other: Degenerative changes of the right TMJ. 4 mm soft tissue nodule along the left frontal scalp,  likely an epidermal cyst. IMPRESSION: 1.  No acute intracranial abnormality. 2.  Asymmetric atrophy of the left parotid gland. Electronically Signed   By: Marin Roberts M.D.   On: 07/25/2022 14:00    Procedures Procedures    Medications Ordered in ED Medications  prochlorperazine (COMPAZINE) injection 5 mg (5 mg Intravenous Given 07/25/22 2058)  ketorolac (TORADOL) 15 MG/ML injection 7.5 mg (7.5 mg Intravenous Given 07/25/22 2057)    ED Course/ Medical Decision Making/ A&P                           Medical Decision Making Amount and/or Complexity of Data Reviewed Radiology: ordered.  Risk Prescription drug management.   Patient with headache.  Left  posterior.  Also pain in the left ear.  Did have some obscuring of the TM on the left side with irrigation did have some blood.  Hearing better on the left side but does have facial numbness.  Will get MRI to evaluate for headache with neurodeficits.  Reportedly has had fluid coming behind her left ear after eating for years after ENT surgery.  Patient given headache treatment and feeling somewhat better.  However patient has pacemaker and unable to get MRI at this time.  Patient is eager to go home and will discharge so she can follow-up with ENT as needed.  Ears had been cleared out and did show potential obstruction causing some of the symptoms.        Final Clinical Impression(s) / ED Diagnoses Final diagnoses:  Generalized headache    Rx / DC Orders ED Discharge Orders     None         Davonna Belling, MD 07/26/22 1453

## 2022-07-25 NOTE — ED Triage Notes (Signed)
Patient has sensation difference to left face and arm.  Generalized weaknes snoted.  Difficulty raising eyebrows reports she is blind in right eye.  Reports ear drainage for weeks and difficulty hearing out of it.

## 2022-07-25 NOTE — ED Notes (Signed)
Family at bedside updated on plan of care.

## 2022-07-25 NOTE — ED Provider Triage Note (Signed)
Emergency Medicine Provider Triage Evaluation Note  [name] , a 88 y.o. female  was evaluated in triage.  Pt complains of left-sided headache and left ear drainage.  Patient states that she has had history of intermittent left ear drainage in the past of which she is seen ENT.  She reports left ear drainage for approximate the past week.  She states she has been trying to get drainage out with a Q-tip.  She noticed swelling and pain worsening over the course of the past week.  Headache occurred a few days after ear drainage and increased pain was noted.  Headache is described as left-sided in nature beginning around Saturday.  She states it is the worst headache of her life.  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  She also states she has been mumbling her speech more recently although she notes chronic condition over the past year.  Patient states she is taken at home tramadol, Tylenol, ibuprofen with no relief of symptoms.  She is on Eliquis for history of pulmonary embolism and venous thromboembolism.  Review of Systems  Positive: See above Negative:   Physical Exam  BP (!) 151/80 (BP Location: Right Arm)   Pulse 80   Temp 98.7 F (37.1 C) (Oral)   Resp 16   Ht '5\' 7"'$  (1.702 m)   Wt 89.8 kg   SpO2 98%   BMI 31.01 kg/m  Gen:   Awake, no distress   Resp:  Normal effort  MSK:   Moves extremities without difficulty  Other:    Medical Decision Making  Medically screening exam initiated at 12:16 PM.  Appropriate orders placed.  [name] was informed that the remainder of the evaluation will be completed by another provider, this initial triage assessment does not replace that evaluation, and the importance of remaining in the ED until their evaluation is complete.     Wilnette Kales, [place] 07/25/22 1222

## 2022-07-25 NOTE — ED Notes (Signed)
Pt in room A&O x4. Pt c/o hearing difficulty in L ear with drainage. Ear wax removal in both ears. Moderate amount of wax bilaterally. L ear with some bleeding and scab. MD made aware. Pt updated on plan of care

## 2022-07-25 NOTE — ED Notes (Signed)
MRI advised MRI would not be able to be done tonight due to no staff being on duty to work with the pacemaker.  MD Alvino Chapel made aware.

## 2022-07-25 NOTE — ED Triage Notes (Signed)
Complains of left sided headache, ear drainage nausea x 3 days and family noticed speech is slower than normal x 2 days.  No vomiting BP 158/82 HR86  RBBB CBG 162 O2 100% RA

## 2022-07-26 ENCOUNTER — Ambulatory Visit: Payer: Medicare Other | Admitting: Physician Assistant

## 2022-08-03 DIAGNOSIS — H9202 Otalgia, left ear: Secondary | ICD-10-CM | POA: Diagnosis not present

## 2022-08-03 DIAGNOSIS — R51 Headache with orthostatic component, not elsewhere classified: Secondary | ICD-10-CM | POA: Diagnosis not present

## 2022-08-07 DIAGNOSIS — M4854XD Collapsed vertebra, not elsewhere classified, thoracic region, subsequent encounter for fracture with routine healing: Secondary | ICD-10-CM | POA: Diagnosis not present

## 2022-08-07 DIAGNOSIS — K5900 Constipation, unspecified: Secondary | ICD-10-CM | POA: Diagnosis not present

## 2022-08-07 DIAGNOSIS — Z9181 History of falling: Secondary | ICD-10-CM | POA: Diagnosis not present

## 2022-08-07 DIAGNOSIS — H5440 Blindness, one eye, unspecified eye: Secondary | ICD-10-CM | POA: Diagnosis not present

## 2022-08-07 DIAGNOSIS — Z950 Presence of cardiac pacemaker: Secondary | ICD-10-CM | POA: Diagnosis not present

## 2022-08-07 DIAGNOSIS — K219 Gastro-esophageal reflux disease without esophagitis: Secondary | ICD-10-CM | POA: Diagnosis not present

## 2022-08-07 DIAGNOSIS — Z86711 Personal history of pulmonary embolism: Secondary | ICD-10-CM | POA: Diagnosis not present

## 2022-08-07 DIAGNOSIS — M542 Cervicalgia: Secondary | ICD-10-CM | POA: Diagnosis not present

## 2022-08-07 DIAGNOSIS — M5410 Radiculopathy, site unspecified: Secondary | ICD-10-CM | POA: Diagnosis not present

## 2022-08-07 DIAGNOSIS — I441 Atrioventricular block, second degree: Secondary | ICD-10-CM | POA: Diagnosis not present

## 2022-08-07 DIAGNOSIS — M519 Unspecified thoracic, thoracolumbar and lumbosacral intervertebral disc disorder: Secondary | ICD-10-CM | POA: Diagnosis not present

## 2022-08-07 DIAGNOSIS — I69398 Other sequelae of cerebral infarction: Secondary | ICD-10-CM | POA: Diagnosis not present

## 2022-08-07 DIAGNOSIS — Z7901 Long term (current) use of anticoagulants: Secondary | ICD-10-CM | POA: Diagnosis not present

## 2022-08-07 DIAGNOSIS — M4800 Spinal stenosis, site unspecified: Secondary | ICD-10-CM | POA: Diagnosis not present

## 2022-08-07 DIAGNOSIS — F32A Depression, unspecified: Secondary | ICD-10-CM | POA: Diagnosis not present

## 2022-08-07 DIAGNOSIS — E039 Hypothyroidism, unspecified: Secondary | ICD-10-CM | POA: Diagnosis not present

## 2022-08-28 ENCOUNTER — Emergency Department (HOSPITAL_BASED_OUTPATIENT_CLINIC_OR_DEPARTMENT_OTHER): Payer: Medicare Other

## 2022-08-28 ENCOUNTER — Encounter (HOSPITAL_BASED_OUTPATIENT_CLINIC_OR_DEPARTMENT_OTHER): Payer: Self-pay | Admitting: Emergency Medicine

## 2022-08-28 ENCOUNTER — Other Ambulatory Visit: Payer: Self-pay

## 2022-08-28 DIAGNOSIS — R200 Anesthesia of skin: Secondary | ICD-10-CM | POA: Diagnosis not present

## 2022-08-28 DIAGNOSIS — Z79899 Other long term (current) drug therapy: Secondary | ICD-10-CM | POA: Insufficient documentation

## 2022-08-28 DIAGNOSIS — R21 Rash and other nonspecific skin eruption: Secondary | ICD-10-CM | POA: Diagnosis present

## 2022-08-28 DIAGNOSIS — B029 Zoster without complications: Secondary | ICD-10-CM | POA: Diagnosis not present

## 2022-08-28 DIAGNOSIS — R519 Headache, unspecified: Secondary | ICD-10-CM | POA: Diagnosis not present

## 2022-08-28 DIAGNOSIS — I10 Essential (primary) hypertension: Secondary | ICD-10-CM | POA: Insufficient documentation

## 2022-08-28 DIAGNOSIS — Z7901 Long term (current) use of anticoagulants: Secondary | ICD-10-CM | POA: Diagnosis not present

## 2022-08-28 DIAGNOSIS — E039 Hypothyroidism, unspecified: Secondary | ICD-10-CM | POA: Diagnosis not present

## 2022-08-28 LAB — COMPREHENSIVE METABOLIC PANEL
ALT: 10 U/L (ref 0–44)
AST: 15 U/L (ref 15–41)
Albumin: 4.1 g/dL (ref 3.5–5.0)
Alkaline Phosphatase: 99 U/L (ref 38–126)
Anion gap: 7 (ref 5–15)
BUN: 17 mg/dL (ref 8–23)
CO2: 25 mmol/L (ref 22–32)
Calcium: 9.5 mg/dL (ref 8.9–10.3)
Chloride: 106 mmol/L (ref 98–111)
Creatinine, Ser: 0.87 mg/dL (ref 0.44–1.00)
GFR, Estimated: >60 mL/min (ref >60)
Glucose, Bld: 109 mg/dL — ABNORMAL HIGH (ref 70–99)
Potassium: 3.9 mmol/L (ref 3.5–5.1)
Sodium: 138 mmol/L (ref 135–145)
Total Bilirubin: 0.3 mg/dL (ref 0.3–1.2)
Total Protein: 7.6 g/dL (ref 6.5–8.1)

## 2022-08-28 LAB — CBC WITH DIFFERENTIAL/PLATELET
Abs Immature Granulocytes: 0 10*3/uL (ref 0.00–0.07)
Basophils Absolute: 0 10*3/uL (ref 0.0–0.1)
Basophils Relative: 0 %
Eosinophils Absolute: 0.1 10*3/uL (ref 0.0–0.5)
Eosinophils Relative: 2 %
HCT: 37.5 % (ref 36.0–46.0)
Hemoglobin: 12.6 g/dL (ref 12.0–15.0)
Lymphocytes Relative: 42 %
Lymphs Abs: 2.1 10*3/uL (ref 0.7–4.0)
MCH: 27.9 pg (ref 26.0–34.0)
MCHC: 33.6 g/dL (ref 30.0–36.0)
MCV: 83 fL (ref 80.0–100.0)
Monocytes Absolute: 0.1 10*3/uL (ref 0.1–1.0)
Monocytes Relative: 3 %
Neutro Abs: 2.6 10*3/uL (ref 1.7–7.7)
Neutrophils Relative %: 53 %
Platelets: 166 10*3/uL (ref 150–400)
RBC: 4.52 MIL/uL (ref 3.87–5.11)
RDW: 16 % — ABNORMAL HIGH (ref 11.5–15.5)
Smear Review: NORMAL
WBC: 4.9 10*3/uL (ref 4.0–10.5)
nRBC: 0 % (ref 0.0–0.2)

## 2022-08-28 LAB — TROPONIN I (HIGH SENSITIVITY): Troponin I (High Sensitivity): 2 ng/L (ref <18)

## 2022-08-28 NOTE — ED Provider Notes (Signed)
Handed patient's EKG from triage. She has a new RBBB and LPFB, new bifascicular block. She has no history of this on prior EKGs. Patient presents for rash; was recently seen for headache and neck pain, also has right arm weakness/numbness. Called cardiology who stated can just get troponin and evaluate patient.   EKG Interpretation  Date/Time:  Sunday August 28 2022 19:27:30 EST Ventricular Rate:  86 PR Interval:  146 QRS Duration: 124 QT Interval:  382 QTC Calculation: 457 R Axis:   115 Text Interpretation: Normal sinus rhythm Right bundle branch block Left posterior fascicular block ** Bifascicular block ** Abnormal ECG When compared with ECG of 21-Nov-2021 13:40, PREVIOUS ECG IS PRESENT Confirmed by Cindee Lame [id number]) on 08/28/2022 8:12:44 PM    Audley Hose, MD 08/28/22 2013

## 2022-08-28 NOTE — ED Triage Notes (Addendum)
Patient c/o rash across her back and chest, unknown when it started.  Patient seen by PCP for headache and neck pain and placed on muscle relaxers Tuesday and on Thursday noticed right arm weakness and numbness.  Patient's family member reports that she noticed "jumbled speech" yesterday at 1600.  Patient reports patient's rash feels "shocky." Patient's right arm is noticeable weak.

## 2022-08-28 NOTE — ED Notes (Signed)
PA made aware of patient, agreed to see patient in triage.

## 2022-08-29 ENCOUNTER — Emergency Department (HOSPITAL_BASED_OUTPATIENT_CLINIC_OR_DEPARTMENT_OTHER)
Admission: EM | Admit: 2022-08-29 | Discharge: 2022-08-29 | Disposition: A | Discharge status: Home / Self Care | Payer: Medicare Other | Attending: Emergency Medicine | Admitting: Emergency Medicine

## 2022-08-29 DIAGNOSIS — B029 Zoster without complications: Secondary | ICD-10-CM

## 2022-08-29 MED ORDER — ACETAMINOPHEN 500 MG PO TABS
1000.0000 mg | ORAL_TABLET | Freq: Once | ORAL | Status: AC
  Administered 2022-08-29: 1000 mg via ORAL
  Filled 2022-08-29: qty 2

## 2022-08-29 MED ORDER — ACYCLOVIR 400 MG PO TABS: 800.0000 mg | ORAL_TABLET | Freq: Every day | ORAL | 0 refills | Status: AC

## 2022-08-29 MED ORDER — ACYCLOVIR 200 MG PO CAPS
800.0000 mg | ORAL_CAPSULE | Freq: Once | ORAL | Status: AC
  Administered 2022-08-29: 800 mg via ORAL
  Filled 2022-08-29: qty 4

## 2022-08-29 MED ORDER — LIDOCAINE 4 % EX CREA: 1.0000 | TOPICAL_CREAM | CUTANEOUS | 0 refills | Status: DC | PRN

## 2022-08-29 NOTE — Discharge Instructions (Addendum)
For pain control you may take 1000 mg of Tylenol every 8 hours as needed.  

## 2022-08-29 NOTE — ED Provider Triage Note (Signed)
Emergency Medicine Provider Triage Evaluation Note  [name] , a 88 y.o. female  was evaluated in triage.  Pt complains of RUE weakness and tingling, along with garbled speech starting at 3PM yesterday. Family member at bedside endorses timing of onset. Denies any difficulty swallowing or dizziness. Denies any recent falls.  Also complains of rash to neck that started a few days ago.   Review of Systems  Positive: Weakness, garbled speech Negative: dizziness  Physical Exam  BP (!) 163/85 (BP Location: Left Arm)   Pulse 89   Temp 98.2 F (36.8 C) (Oral)   Resp 18   Ht '5\' 7"'$  (1.702 m)   Wt 88.5 kg   SpO2 98%   BMI 30.54 kg/m  Gen:   Awake, no distress, garbled speech Resp:  Normal effort  MSK:   Moves extremities, reduced strength of RUE Other:   No facial droop.   Medical Decision Making  Medically screening exam initiated at 7:16PM.  Appropriate orders placed.  [name] was informed that the remainder of the evaluation will be completed by another provider, this initial triage assessment does not replace that evaluation, and the importance of remaining in the ED until their evaluation is complete.  Pt is outside 24 hour stroke window, discussed with family, will not call stroke 1. Start with head CT, EKG and labs   [name], [place] 08/29/22 0028

## 2022-08-29 NOTE — ED Provider Notes (Signed)
[facility] EMERGENCY DEPT Provider Note  CSN: [id number] Arrival date & time: 08/28/22 1856  Chief Complaint(s) Rash and Numbness  HPI [name] is a 88 y.o. female     Rash Location:  Shoulder/arm and torso Shoulder/arm rash location:  R shoulder Torso rash location:  Upper back Quality: blistering and painful   Pain details:    Quality:  Burning and aching   Severity:  Severe   Onset quality:  Gradual   Duration:  1 week   Timing:  Constant   Progression:  Worsening Onset quality:  Gradual Duration:  2 days Timing:  Constant Progression:  Spreading Chronicity:  New Relieved by:  Nothing Worsened by:  Nothing Associated symptoms: headaches   Associated symptoms: no fever    Patient states that her right arm has been hurting with movement.  This makes it difficult to use it.  No overt weakness.  Patient was seen a few days ago for this pain and given muscle relaxers for possible muscle strain/spasm.  Past Medical History Past Medical History:  Diagnosis Date   Central retinal artery occlusion of right eye 2015   lost complete vision   Edema    Hypertension    not on BP medication currently   Neuropathy    Post-surgical hypothyroidism    Pulmonary emboli (HCC)    VTE (venous thromboembolism) 12/2018   LLE DVT and PE   Patient Active Problem List   Diagnosis Date Noted   Symptomatic bradycardia 11/25/2021   Pacemaker 11/25/2021   Diverticulosis of colon with hemorrhage 05/24/2021   Lower GI bleed 04/12/2021   History of pulmonary embolus (PE) 04/12/2021   History of DVT (deep vein thrombosis) 04/12/2021   Chronic anticoagulation 04/12/2021   AV block 01/09/2021   Fall at home, initial encounter 09/27/2020   Post-surgical hypothyroidism    Neuropathy    Hypertension    VTE (venous thromboembolism) 12/2018   Home Medication(s) Prior to Admission medications   Medication Sig Start Date End Date Taking? Authorizing Provider   acyclovir (ZOVIRAX) 400 MG tablet Take 2 tablets (800 mg total) by mouth 5 (five) times daily for 7 days. 08/29/22 09/05/22 Yes Jaydyn Menon, Grayce Sessions, MD  lidocaine (LMX) 4 % cream Apply 1 Application topically as needed. 08/29/22  Yes Heman Que, Grayce Sessions, MD  ELIQUIS 5 MG TABS tablet Take 1 tablet (5 mg total) by mouth 2 (two) times daily. 04/19/21   Enzo Bi, MD  furosemide (LASIX) 20 MG tablet Take 20 mg by mouth daily.    [provider]  ketoconazole (NIZORAL) 2 % cream Apply 1 application. topically daily. 01/24/22   McDonald, Stephan Minister, DPM  levothyroxine (SYNTHROID) 75 MCG tablet Take 75 mcg by mouth daily before breakfast.    [provider]  magnesium hydroxide (MILK OF MAGNESIA) 400 MG/5ML suspension Take 15-30 mLs by mouth daily as needed for mild constipation.    [provider]  meclizine (ANTIVERT) 25 MG tablet Take 25 mg by mouth as needed for dizziness.    [provider]  mirtazapine (REMERON) 15 MG tablet Take 15 mg by mouth at bedtime as needed (depression).    [provider]  NEOMYCIN-POLYMYXIN-HYDROCORTISONE (CORTISPORIN) 1 % SOLN OTIC solution Apply to nail beds from procedure site twice daily after soaks 03/07/22   McDonald, Adam R, DPM  NON FORMULARY Take 30-60 mLs by mouth See admin instructions. Prune juice- Drink 30-60 ml's by mouth as needed for constipation    [provider]  omeprazole (PRILOSEC) 20 MG capsule Take 20 mg by mouth as needed (acid reflux).    [provider]  polyvinyl alcohol (LIQUIFILM TEARS) 1.4 % ophthalmic solution Place 1 drop into both eyes as needed for dry eyes. Patient taking differently: Place 1 drop into the left eye as needed (for blurry vision). 10/05/20   Mercy Riding, MD  pregabalin (LYRICA) 75 MG capsule Take 1 capsule (75 mg total) by mouth 2 (two) times daily. 10/05/20   Mercy Riding, MD  traMADol (ULTRAM) 50 MG tablet Take 50 mg by mouth as needed for moderate pain or  severe pain.    [provider]  hydrochlorothiazide (MICROZIDE) 12.5 MG capsule Take 1 capsule (12.5 mg total) by mouth daily. Patient not taking: No sig reported 10/05/20 04/12/21  Mercy Riding, MD                                                                                                                                    Allergies Codeine  Review of Systems Review of Systems  Constitutional:  Negative for fever.  Skin:  Positive for rash.  Neurological:  Positive for headaches.   As noted in HPI  Physical Exam Vital Signs  I have reviewed the triage vital signs BP (!) 158/75 (BP Location: Right Arm)   Pulse 76   Temp 98.5 F (36.9 C)   Resp 20   Ht '5\' 7"'$  (1.702 m)   Wt 88.5 kg   SpO2 96%   BMI 30.54 kg/m   Physical Exam Vitals reviewed.  Constitutional:      General: She is not in acute distress.    Appearance: She is well-developed. She is not diaphoretic.  HENT:     Head: Normocephalic and atraumatic.     Right Ear: External ear normal.     Left Ear: External ear normal.     Nose: Nose normal.  Eyes:     General: No scleral icterus.    Conjunctiva/sclera: Conjunctivae normal.  Neck:     Trachea: Phonation normal.  Cardiovascular:     Rate and Rhythm: Normal rate and regular rhythm.  Pulmonary:     Effort: Pulmonary effort is normal. No respiratory distress.     Breath sounds: No stridor.  Abdominal:     General: There is no distension.  Musculoskeletal:        General: Normal range of motion.     Cervical back: Normal range of motion.  Skin:    Findings: Rash present. Rash is vesicular.       Neurological:     Mental Status: She is alert and oriented to person, place, and time.     Comments: 5 out of 5 strength in bilateral upper extremities with sensation intact.  Psychiatric:        Behavior: Behavior normal.     ED Results and Treatments Labs (all labs ordered are listed,  but only abnormal results are displayed) Labs Reviewed   CBC WITH DIFFERENTIAL/PLATELET - Abnormal; Notable for the following components:      Result Value   RDW 16.0 (*)    All other components within normal limits  COMPREHENSIVE METABOLIC PANEL - Abnormal; Notable for the following components:   Glucose, Bld 109 (*)    All other components within normal limits  URINALYSIS, ROUTINE W REFLEX MICROSCOPIC  TROPONIN I (HIGH SENSITIVITY)  TROPONIN I (HIGH SENSITIVITY)                                                                                                                         EKG  EKG Interpretation  Date/Time:  Sunday August 28 2022 19:27:30 EST Ventricular Rate:  86 PR Interval:  146 QRS Duration: 124 QT Interval:  382 QTC Calculation: 457 R Axis:   115 Text Interpretation: Normal sinus rhythm Right bundle branch block Left posterior fascicular block ** Bifascicular block ** Abnormal ECG When compared with ECG of 21-Nov-2021 13:40, PREVIOUS ECG IS PRESENT Confirmed by Cindee Lame [phone]) on 08/28/2022 8:12:44 PM       Radiology CT Head Wo Contrast  Result Date: 08/28/2022 CLINICAL DATA:  Headache, neck pain EXAM: CT HEAD WITHOUT CONTRAST TECHNIQUE: Contiguous axial images were obtained from the base of the skull through the vertex without intravenous contrast. RADIATION DOSE REDUCTION: This exam was performed according to the departmental dose-optimization program which includes automated exposure control, adjustment of the mA and/or kV according to patient size and/or use of iterative reconstruction technique. COMPARISON:  07/25/2022 FINDINGS: Brain: No evidence of acute infarction, hemorrhage, hydrocephalus, extra-axial collection or mass lesion/mass effect. Mild subcortical white matter and periventricular small vessel ischemic changes. Vascular: Intracranial atherosclerosis. Skull: Normal. Negative for fracture or focal lesion. Sinuses/Orbits: The visualized paranasal sinuses are essentially clear. The mastoid air cells are  unopacified. Other: None. IMPRESSION: No evidence of acute intracranial abnormality. Mild small vessel ischemic changes. Electronically Signed   By: Julian Hy M.D.   On: 08/28/2022 20:04    Medications Ordered in ED Medications  acyclovir (ZOVIRAX) 200 MG capsule 800 mg (800 mg Oral Given 08/29/22 0230)  acetaminophen (TYLENOL) tablet 1,000 mg (1,000 mg Oral Given 08/29/22 0231)  Procedures Procedures  (including critical care time)  Medical Decision Making / ED Course   Medical Decision Making Amount and/or Complexity of Data Reviewed Labs: ordered. Decision-making details documented in ED Course. Radiology: ordered and independent interpretation performed. Decision-making details documented in ED Course. ECG/medicine tests: ordered and independent interpretation performed. Decision-making details documented in ED Course.  Risk Prescription drug management.     Complexity of Problem:  Co-morbidities/SDOH that complicate the patient evaluation/care: Prior DVT/PE on Xarelto  Patient's presenting problem/concern, DDX, and MDM listed below: Right arm pain with vesicular rash Consistent with shingles. Patient does not have any focal deficits concerning for stroke. She was seen in the MSE process where labs and a CT head were obtained.    Complexity of Data:   Cardiac Monitoring/EKG: EKG without acute ischemic changes or evidence of pericarditis.  Laboratory Tests ordered listed below with my independent interpretation: CBC without leukocytosis or anemia. CMP without significant electrolyte derangements or renal insufficiency.   Imaging Studies ordered listed below with my independent interpretation: CT head negative for ICH     ED Course:    Assessment, Add'l Intervention, and Reassessment: Shingles We will treat with acyclovir,  lidocaine and Tylenol. Recommended PCP follow-up         Final Clinical Impression(s) / ED Diagnoses Final diagnoses:  Herpes zoster without complication   The patient appears reasonably screened and/or stabilized for discharge and I doubt any other medical condition or other [facility] requiring further screening, evaluation, or treatment in the ED at this time. I have discussed the findings, Dx and Tx plan with the patient/family who expressed understanding and agree(s) with the plan. Discharge instructions discussed at length. The patient/family was given strict return precautions who verbalized understanding of the instructions. No further questions at time of discharge.  Disposition: Discharge  Condition: Good  ED Discharge Orders          Ordered    acyclovir (ZOVIRAX) 400 MG tablet  5 times daily        08/29/22 0228    lidocaine (LMX) 4 % cream  As needed        08/29/22 0228            Follow Up: [name], [place] [address] [phone]  Call  to schedule an appointment for close follow up           This chart was dictated using voice recognition software.  Despite best efforts to proofread,  errors can occur which can change the documentation meaning.    Fatima Blank, MD 08/29/22 [phone]

## 2022-09-29 DIAGNOSIS — Z961 Presence of intraocular lens: Secondary | ICD-10-CM | POA: Diagnosis not present

## 2022-09-29 DIAGNOSIS — H04123 Dry eye syndrome of bilateral lacrimal glands: Secondary | ICD-10-CM | POA: Diagnosis not present

## 2022-10-08 DIAGNOSIS — M519 Unspecified thoracic, thoracolumbar and lumbosacral intervertebral disc disorder: Secondary | ICD-10-CM | POA: Diagnosis not present

## 2022-10-08 DIAGNOSIS — Z9181 History of falling: Secondary | ICD-10-CM | POA: Diagnosis not present

## 2022-10-08 DIAGNOSIS — Z90710 Acquired absence of both cervix and uterus: Secondary | ICD-10-CM | POA: Diagnosis not present

## 2022-10-08 DIAGNOSIS — Z96651 Presence of right artificial knee joint: Secondary | ICD-10-CM | POA: Diagnosis not present

## 2022-10-08 DIAGNOSIS — Z86718 Personal history of other venous thrombosis and embolism: Secondary | ICD-10-CM | POA: Diagnosis not present

## 2022-10-08 DIAGNOSIS — Z8719 Personal history of other diseases of the digestive system: Secondary | ICD-10-CM | POA: Diagnosis not present

## 2022-10-08 DIAGNOSIS — E890 Postprocedural hypothyroidism: Secondary | ICD-10-CM | POA: Diagnosis not present

## 2022-10-08 DIAGNOSIS — I441 Atrioventricular block, second degree: Secondary | ICD-10-CM | POA: Diagnosis not present

## 2022-10-08 DIAGNOSIS — Z86011 Personal history of benign neoplasm of the brain: Secondary | ICD-10-CM | POA: Diagnosis not present

## 2022-10-08 DIAGNOSIS — Z87311 Personal history of (healed) other pathological fracture: Secondary | ICD-10-CM | POA: Diagnosis not present

## 2022-10-08 DIAGNOSIS — Z950 Presence of cardiac pacemaker: Secondary | ICD-10-CM | POA: Diagnosis not present

## 2022-10-08 DIAGNOSIS — M4800 Spinal stenosis, site unspecified: Secondary | ICD-10-CM | POA: Diagnosis not present

## 2022-10-08 DIAGNOSIS — M5410 Radiculopathy, site unspecified: Secondary | ICD-10-CM | POA: Diagnosis not present

## 2022-10-08 DIAGNOSIS — Z9089 Acquired absence of other organs: Secondary | ICD-10-CM | POA: Diagnosis not present

## 2022-10-08 DIAGNOSIS — Z86711 Personal history of pulmonary embolism: Secondary | ICD-10-CM | POA: Diagnosis not present

## 2022-10-10 ENCOUNTER — Ambulatory Visit (INDEPENDENT_AMBULATORY_CARE_PROVIDER_SITE_OTHER): Payer: Medicare Other

## 2022-10-10 DIAGNOSIS — I4430 Unspecified atrioventricular block: Secondary | ICD-10-CM

## 2022-10-10 LAB — CUP PACEART REMOTE DEVICE CHECK
Battery Remaining Longevity: 113 mo
Battery Remaining Percentage: 89 %
Battery Voltage: 3.02 V
Brady Statistic AP VP Percent: 1 %
Brady Statistic AP VS Percent: 1 %
Brady Statistic AS VP Percent: 1 %
Brady Statistic AS VS Percent: 99 %
Brady Statistic RA Percent Paced: 1 %
Brady Statistic RV Percent Paced: 1 %
Date Time Interrogation Session: 20231218020014
Implantable Lead Connection Status: 753985
Implantable Lead Connection Status: 753985
Implantable Lead Implant Date: 20220321
Implantable Lead Implant Date: 20220321
Implantable Lead Location: 753859
Implantable Lead Location: 753860
Implantable Pulse Generator Implant Date: 20220321
Lead Channel Impedance Value: 480 Ohm
Lead Channel Impedance Value: 550 Ohm
Lead Channel Pacing Threshold Amplitude: 0.5 V
Lead Channel Pacing Threshold Amplitude: 0.75 V
Lead Channel Pacing Threshold Pulse Width: 0.4 ms
Lead Channel Pacing Threshold Pulse Width: 0.4 ms
Lead Channel Sensing Intrinsic Amplitude: 12 mV
Lead Channel Sensing Intrinsic Amplitude: 3 mV
Lead Channel Setting Pacing Amplitude: 2 V
Lead Channel Setting Pacing Amplitude: 2.5 V
Lead Channel Setting Pacing Pulse Width: 0.4 ms
Lead Channel Setting Sensing Sensitivity: 2 mV
Pulse Gen Model: 2272
Pulse Gen Serial Number: 3910619

## 2022-10-20 DIAGNOSIS — M5410 Radiculopathy, site unspecified: Secondary | ICD-10-CM | POA: Diagnosis not present

## 2022-10-20 DIAGNOSIS — I441 Atrioventricular block, second degree: Secondary | ICD-10-CM | POA: Diagnosis not present

## 2022-10-20 DIAGNOSIS — M519 Unspecified thoracic, thoracolumbar and lumbosacral intervertebral disc disorder: Secondary | ICD-10-CM | POA: Diagnosis not present

## 2022-10-20 DIAGNOSIS — M4800 Spinal stenosis, site unspecified: Secondary | ICD-10-CM | POA: Diagnosis not present

## 2022-11-07 DIAGNOSIS — Z86711 Personal history of pulmonary embolism: Secondary | ICD-10-CM | POA: Diagnosis not present

## 2022-11-07 DIAGNOSIS — Z86718 Personal history of other venous thrombosis and embolism: Secondary | ICD-10-CM | POA: Diagnosis not present

## 2022-11-07 DIAGNOSIS — Z86011 Personal history of benign neoplasm of the brain: Secondary | ICD-10-CM | POA: Diagnosis not present

## 2022-11-07 DIAGNOSIS — Z950 Presence of cardiac pacemaker: Secondary | ICD-10-CM | POA: Diagnosis not present

## 2022-11-07 DIAGNOSIS — Z9181 History of falling: Secondary | ICD-10-CM | POA: Diagnosis not present

## 2022-11-07 DIAGNOSIS — M519 Unspecified thoracic, thoracolumbar and lumbosacral intervertebral disc disorder: Secondary | ICD-10-CM | POA: Diagnosis not present

## 2022-11-07 DIAGNOSIS — Z96651 Presence of right artificial knee joint: Secondary | ICD-10-CM | POA: Diagnosis not present

## 2022-11-07 DIAGNOSIS — Z8719 Personal history of other diseases of the digestive system: Secondary | ICD-10-CM | POA: Diagnosis not present

## 2022-11-07 DIAGNOSIS — E890 Postprocedural hypothyroidism: Secondary | ICD-10-CM | POA: Diagnosis not present

## 2022-11-07 DIAGNOSIS — Z9089 Acquired absence of other organs: Secondary | ICD-10-CM | POA: Diagnosis not present

## 2022-11-07 DIAGNOSIS — M5410 Radiculopathy, site unspecified: Secondary | ICD-10-CM | POA: Diagnosis not present

## 2022-11-07 DIAGNOSIS — Z87311 Personal history of (healed) other pathological fracture: Secondary | ICD-10-CM | POA: Diagnosis not present

## 2022-11-07 DIAGNOSIS — Z90710 Acquired absence of both cervix and uterus: Secondary | ICD-10-CM | POA: Diagnosis not present

## 2022-11-07 DIAGNOSIS — M4800 Spinal stenosis, site unspecified: Secondary | ICD-10-CM | POA: Diagnosis not present

## 2022-11-07 DIAGNOSIS — I441 Atrioventricular block, second degree: Secondary | ICD-10-CM | POA: Diagnosis not present

## 2022-11-08 DIAGNOSIS — Z86711 Personal history of pulmonary embolism: Secondary | ICD-10-CM | POA: Diagnosis not present

## 2022-11-08 DIAGNOSIS — Z87311 Personal history of (healed) other pathological fracture: Secondary | ICD-10-CM | POA: Diagnosis not present

## 2022-11-08 DIAGNOSIS — Z86011 Personal history of benign neoplasm of the brain: Secondary | ICD-10-CM | POA: Diagnosis not present

## 2022-11-08 DIAGNOSIS — M519 Unspecified thoracic, thoracolumbar and lumbosacral intervertebral disc disorder: Secondary | ICD-10-CM | POA: Diagnosis not present

## 2022-11-08 DIAGNOSIS — Z950 Presence of cardiac pacemaker: Secondary | ICD-10-CM | POA: Diagnosis not present

## 2022-11-08 DIAGNOSIS — M4800 Spinal stenosis, site unspecified: Secondary | ICD-10-CM | POA: Diagnosis not present

## 2022-11-08 DIAGNOSIS — Z90710 Acquired absence of both cervix and uterus: Secondary | ICD-10-CM | POA: Diagnosis not present

## 2022-11-08 DIAGNOSIS — Z9181 History of falling: Secondary | ICD-10-CM | POA: Diagnosis not present

## 2022-11-08 DIAGNOSIS — I441 Atrioventricular block, second degree: Secondary | ICD-10-CM | POA: Diagnosis not present

## 2022-11-08 DIAGNOSIS — Z9089 Acquired absence of other organs: Secondary | ICD-10-CM | POA: Diagnosis not present

## 2022-11-08 DIAGNOSIS — E890 Postprocedural hypothyroidism: Secondary | ICD-10-CM | POA: Diagnosis not present

## 2022-11-08 DIAGNOSIS — Z86718 Personal history of other venous thrombosis and embolism: Secondary | ICD-10-CM | POA: Diagnosis not present

## 2022-11-08 DIAGNOSIS — M5410 Radiculopathy, site unspecified: Secondary | ICD-10-CM | POA: Diagnosis not present

## 2022-11-08 DIAGNOSIS — Z96651 Presence of right artificial knee joint: Secondary | ICD-10-CM | POA: Diagnosis not present

## 2022-11-08 DIAGNOSIS — Z8719 Personal history of other diseases of the digestive system: Secondary | ICD-10-CM | POA: Diagnosis not present

## 2022-11-10 DIAGNOSIS — I441 Atrioventricular block, second degree: Secondary | ICD-10-CM | POA: Diagnosis not present

## 2022-11-10 DIAGNOSIS — M519 Unspecified thoracic, thoracolumbar and lumbosacral intervertebral disc disorder: Secondary | ICD-10-CM | POA: Diagnosis not present

## 2022-11-10 DIAGNOSIS — Z87311 Personal history of (healed) other pathological fracture: Secondary | ICD-10-CM | POA: Diagnosis not present

## 2022-11-10 DIAGNOSIS — Z9181 History of falling: Secondary | ICD-10-CM | POA: Diagnosis not present

## 2022-11-10 DIAGNOSIS — E890 Postprocedural hypothyroidism: Secondary | ICD-10-CM | POA: Diagnosis not present

## 2022-11-10 DIAGNOSIS — Z9089 Acquired absence of other organs: Secondary | ICD-10-CM | POA: Diagnosis not present

## 2022-11-10 DIAGNOSIS — Z86011 Personal history of benign neoplasm of the brain: Secondary | ICD-10-CM | POA: Diagnosis not present

## 2022-11-10 DIAGNOSIS — Z8719 Personal history of other diseases of the digestive system: Secondary | ICD-10-CM | POA: Diagnosis not present

## 2022-11-10 DIAGNOSIS — M4800 Spinal stenosis, site unspecified: Secondary | ICD-10-CM | POA: Diagnosis not present

## 2022-11-10 DIAGNOSIS — Z950 Presence of cardiac pacemaker: Secondary | ICD-10-CM | POA: Diagnosis not present

## 2022-11-10 DIAGNOSIS — Z86718 Personal history of other venous thrombosis and embolism: Secondary | ICD-10-CM | POA: Diagnosis not present

## 2022-11-10 DIAGNOSIS — Z96651 Presence of right artificial knee joint: Secondary | ICD-10-CM | POA: Diagnosis not present

## 2022-11-10 DIAGNOSIS — Z86711 Personal history of pulmonary embolism: Secondary | ICD-10-CM | POA: Diagnosis not present

## 2022-11-10 DIAGNOSIS — Z90710 Acquired absence of both cervix and uterus: Secondary | ICD-10-CM | POA: Diagnosis not present

## 2022-11-10 DIAGNOSIS — M5410 Radiculopathy, site unspecified: Secondary | ICD-10-CM | POA: Diagnosis not present

## 2022-11-14 DIAGNOSIS — Z86718 Personal history of other venous thrombosis and embolism: Secondary | ICD-10-CM | POA: Diagnosis not present

## 2022-11-14 DIAGNOSIS — Z9181 History of falling: Secondary | ICD-10-CM | POA: Diagnosis not present

## 2022-11-14 DIAGNOSIS — Z86011 Personal history of benign neoplasm of the brain: Secondary | ICD-10-CM | POA: Diagnosis not present

## 2022-11-14 DIAGNOSIS — M519 Unspecified thoracic, thoracolumbar and lumbosacral intervertebral disc disorder: Secondary | ICD-10-CM | POA: Diagnosis not present

## 2022-11-14 DIAGNOSIS — Z950 Presence of cardiac pacemaker: Secondary | ICD-10-CM | POA: Diagnosis not present

## 2022-11-14 DIAGNOSIS — Z8719 Personal history of other diseases of the digestive system: Secondary | ICD-10-CM | POA: Diagnosis not present

## 2022-11-14 DIAGNOSIS — Z87311 Personal history of (healed) other pathological fracture: Secondary | ICD-10-CM | POA: Diagnosis not present

## 2022-11-14 DIAGNOSIS — Z86711 Personal history of pulmonary embolism: Secondary | ICD-10-CM | POA: Diagnosis not present

## 2022-11-14 DIAGNOSIS — I441 Atrioventricular block, second degree: Secondary | ICD-10-CM | POA: Diagnosis not present

## 2022-11-14 DIAGNOSIS — Z96651 Presence of right artificial knee joint: Secondary | ICD-10-CM | POA: Diagnosis not present

## 2022-11-14 DIAGNOSIS — M4800 Spinal stenosis, site unspecified: Secondary | ICD-10-CM | POA: Diagnosis not present

## 2022-11-14 DIAGNOSIS — Z9089 Acquired absence of other organs: Secondary | ICD-10-CM | POA: Diagnosis not present

## 2022-11-14 DIAGNOSIS — M5410 Radiculopathy, site unspecified: Secondary | ICD-10-CM | POA: Diagnosis not present

## 2022-11-14 DIAGNOSIS — E890 Postprocedural hypothyroidism: Secondary | ICD-10-CM | POA: Diagnosis not present

## 2022-11-14 DIAGNOSIS — Z90710 Acquired absence of both cervix and uterus: Secondary | ICD-10-CM | POA: Diagnosis not present

## 2022-11-14 NOTE — Progress Notes (Signed)
Remote pacemaker transmission.   

## 2022-11-21 DIAGNOSIS — Z950 Presence of cardiac pacemaker: Secondary | ICD-10-CM | POA: Diagnosis not present

## 2022-11-21 DIAGNOSIS — Z86011 Personal history of benign neoplasm of the brain: Secondary | ICD-10-CM | POA: Diagnosis not present

## 2022-11-21 DIAGNOSIS — Z9181 History of falling: Secondary | ICD-10-CM | POA: Diagnosis not present

## 2022-11-21 DIAGNOSIS — Z86711 Personal history of pulmonary embolism: Secondary | ICD-10-CM | POA: Diagnosis not present

## 2022-11-21 DIAGNOSIS — M4800 Spinal stenosis, site unspecified: Secondary | ICD-10-CM | POA: Diagnosis not present

## 2022-11-21 DIAGNOSIS — M519 Unspecified thoracic, thoracolumbar and lumbosacral intervertebral disc disorder: Secondary | ICD-10-CM | POA: Diagnosis not present

## 2022-11-21 DIAGNOSIS — Z96651 Presence of right artificial knee joint: Secondary | ICD-10-CM | POA: Diagnosis not present

## 2022-11-21 DIAGNOSIS — E890 Postprocedural hypothyroidism: Secondary | ICD-10-CM | POA: Diagnosis not present

## 2022-11-21 DIAGNOSIS — M5410 Radiculopathy, site unspecified: Secondary | ICD-10-CM | POA: Diagnosis not present

## 2022-11-21 DIAGNOSIS — I441 Atrioventricular block, second degree: Secondary | ICD-10-CM | POA: Diagnosis not present

## 2022-11-21 DIAGNOSIS — Z9089 Acquired absence of other organs: Secondary | ICD-10-CM | POA: Diagnosis not present

## 2022-11-21 DIAGNOSIS — Z87311 Personal history of (healed) other pathological fracture: Secondary | ICD-10-CM | POA: Diagnosis not present

## 2022-11-21 DIAGNOSIS — Z90710 Acquired absence of both cervix and uterus: Secondary | ICD-10-CM | POA: Diagnosis not present

## 2022-11-21 DIAGNOSIS — Z8719 Personal history of other diseases of the digestive system: Secondary | ICD-10-CM | POA: Diagnosis not present

## 2022-11-21 DIAGNOSIS — Z86718 Personal history of other venous thrombosis and embolism: Secondary | ICD-10-CM | POA: Diagnosis not present

## 2022-11-22 ENCOUNTER — Telehealth: Payer: Self-pay | Admitting: Internal Medicine

## 2022-11-22 NOTE — Telephone Encounter (Signed)
Message received from [place] below shared with Dr. Lovena Le. Dr. Tanna Furry orders are as follows: Have patient take Lasix 40 mg, BID for the next 3 days.  If weight gain does not go back down, schedule an appointment to see PCP or [facility] PA-C.   I called Pt, and spoke to Daughter, Ms. [name].  Ms. Dyann Ruddle received Dr Tanna Furry orders as stated above, and wrote them down.  Ms. Dyann Ruddle stated she will have her mother do this, and call us with an update this Friday.  Ms. Dyann Ruddle also aware of Dr. Tanna Furry recommendation for PCP, or PA follow up.  Ms. Dyann Ruddle stated her mother has a [facility] appointment on 11/30/2022, next Wednesday.    I will update Dr. Lovena Le on Friday, regarding the outcome of this order.

## 2022-11-22 NOTE — Telephone Encounter (Signed)
Pt c/o swelling: STAT is pt has developed SOB within 24 hours  If swelling, where is the swelling located?   How much weight have you gained and in what time span?   Have you gained 3 pounds in a day or 5 pounds in a week?   Do you have a log of your daily weights (if so, list)?   Are you currently taking a fluid pill?   Are you currently SOB?   Have you traveled recently?   1- The swelling is in her legs and feet 2- She has gained 4 lbs in a week  3- Last week she gained 4 lbs 4- 1/24 202.4      1/25 205.8      1/26 206.8      1/27 207.4      1/28 206.0      1/29 205.6      1/30 206.2 5- Yes, Furosemide '20mg'$  her primary raised the dosage from 2 to 3 per day last week  6- Yes 7- No  Patient's daughter sent message to scheduling regarding swelling, above are her answer's regarding her mother's swelling

## 2022-11-25 ENCOUNTER — Telehealth: Payer: Self-pay | Admitting: Internal Medicine

## 2022-11-25 NOTE — Telephone Encounter (Signed)
Pt c/o medication issue:  1. Name of Medication:   furosemide (LASIX) 20 MG tablet    2. How are you currently taking this medication (dosage and times per day)?   3. Are you having a reaction (difficulty breathing--STAT)? No  4. What is your medication issue? Pt's daughter returning nurses call as requested regarding weight due to medication increase. Please advise

## 2022-11-25 NOTE — Telephone Encounter (Signed)
Pt daughter called back per message received during Dr. Forde Dandy clinic.    Unknown if Lasix worked this week, as I was unable to speak with daughter. Two voicemail messages left prior to 300 pm, recommending follow up with PCP or [facility] provider if weight gain still a concern.  Dr Lovena Le ordered more lasix, and, if plan of care did not work, PCP visit recommended. This info left in voicemail message, as I work with Dr. Lovena Le in clinic today.  [facility] telephone number left on voicemail.

## 2022-11-29 NOTE — Progress Notes (Unsigned)
Electrophysiology Office Note Date: 11/30/2022  ID:  [name], DOB 1927/05/07, MRN [id number]  PCP: [name], DO Primary Cardiologist: None Electrophysiologist: Cristopher Peru, MD   CC: Pacemaker follow-up  [name] is a 89 y.o. female seen today for Cristopher Peru, MD for routine electrophysiology followup. Since last being seen in our clinic the patient reports doing OK. She has noted more swelling in her legs, described it as a heaviness as well. Drinking lots of fluid, at least 2 bottles of water daily. Limits sodium, but does use table salt from time to time. She has chronic SOB with more than mild/moderate exertion.  she denies chest pain, palpitations, PND, orthopnea, nausea, vomiting, dizziness, syncope, weight gain, or early satiety.   Device History: St. Jude Dual Chamber PPM implanted 01/11/21 for symptomatic bradycardia   Past Medical History:  Diagnosis Date   Central retinal artery occlusion of right eye 2015   lost complete vision   Edema    Hypertension    not on BP medication currently   Neuropathy    Post-surgical hypothyroidism    Pulmonary emboli (HCC)    VTE (venous thromboembolism) 12/2018   LLE DVT and PE    Current Outpatient Medications  Medication Instructions   Eliquis 5 mg, Oral, 2 times daily   furosemide (LASIX) 20 mg, Oral, Daily   levothyroxine (SYNTHROID) 75 mcg, Oral, Daily before breakfast   magnesium hydroxide (MILK OF MAGNESIA) 400 MG/5ML suspension 15-30 mLs, Oral, Daily PRN   meclizine (ANTIVERT) 25 mg, Oral, As needed   mirtazapine (REMERON) 15 mg, Oral, At bedtime PRN   NON FORMULARY 30-60 mLs, Oral, See admin instructions, Prune juice- Drink 30-60 ml's by mouth as needed for constipation   omeprazole (PRILOSEC) 20 mg, Oral, As needed   polyvinyl alcohol (LIQUIFILM TEARS) 1.4 % ophthalmic solution 1 drop, Both Eyes, As needed   pregabalin (LYRICA) 75 mg, Oral, 2 times daily   traMADol (ULTRAM) 50 mg, Oral, As needed     Family History: History reviewed. No pertinent family history.  Physical Exam: Vitals:   11/30/22 1047  BP: 116/60  Pulse: 91  SpO2: 96%  Weight: 210 lb (95.3 kg)  Height: '5\' 7"'$  (1.702 m)     GEN- NAD. A&O x 3. Normal affect HEENT: Normocephalic, atraumatic Lungs- CTAB, Normal effort.  Heart- Regular rate and rhythm rate and rhythm. No M/G/R.  Extremities- Trace peripheral edema. no clubbing or cyanosis Skin- warm and dry, no rash or lesion, PPM pocket well healed.  PPM Interrogation-  reviewed in detail today,  See PACEART report.  EKG is not ordered today  Other studies Reviewed: Additional studies/ records that were reviewed today include: Previous EP office notes, Previous remote checks, Most recent labwork.   Assessment and Plan:  1.  Advanced AV block  s/p Abbott PPM  Normal PPM function See Pace Art report No changes today  2. Chronic diastolic CHF Volume status looks OK Continue lasix 20 mg daily, can take additional 20 mg as needed. Reviewed sliding scale diuretics at length.  Drinks lots of fluid. Encouraged limitation of fluid to <2000 ml and sodium to <2000 mg, More likely 1500 for both.    Current medicines are reviewed at length with the patient today.    Labs/ tests ordered today include:  Orders Placed This Encounter  Procedures   Basic metabolic panel   CBC    Disposition:   Follow up with EP APP in 6 months   Signed, Legrand Como  Joesph July, PA-C  11/30/2022 11:27 AM  [place] [address] [phone] (office) [phone] (fax)

## 2022-11-30 ENCOUNTER — Encounter: Payer: Self-pay | Admitting: Student

## 2022-11-30 ENCOUNTER — Ambulatory Visit: Payer: Medicare Other | Attending: Student | Admitting: Student

## 2022-11-30 VITALS — BP 116/60 | HR 91 | Ht 67.0 in | Wt 210.0 lb

## 2022-11-30 DIAGNOSIS — I4430 Unspecified atrioventricular block: Secondary | ICD-10-CM

## 2022-11-30 DIAGNOSIS — R001 Bradycardia, unspecified: Secondary | ICD-10-CM

## 2022-11-30 DIAGNOSIS — Z950 Presence of cardiac pacemaker: Secondary | ICD-10-CM | POA: Diagnosis not present

## 2022-11-30 LAB — CUP PACEART INCLINIC DEVICE CHECK
Battery Remaining Longevity: 123 mo
Battery Voltage: 3.01 V
Brady Statistic RA Percent Paced: 0.17 %
Brady Statistic RV Percent Paced: 0 %
Date Time Interrogation Session: 20240207112455
Implantable Lead Connection Status: 753985
Implantable Lead Connection Status: 753985
Implantable Lead Implant Date: 20220321
Implantable Lead Implant Date: 20220321
Implantable Lead Location: 753859
Implantable Lead Location: 753860
Implantable Pulse Generator Implant Date: 20220321
Lead Channel Impedance Value: 487.5 Ohm
Lead Channel Impedance Value: 575 Ohm
Lead Channel Pacing Threshold Amplitude: 0.75 V
Lead Channel Pacing Threshold Amplitude: 0.75 V
Lead Channel Pacing Threshold Amplitude: 0.75 V
Lead Channel Pacing Threshold Amplitude: 0.75 V
Lead Channel Pacing Threshold Pulse Width: 0.4 ms
Lead Channel Pacing Threshold Pulse Width: 0.4 ms
Lead Channel Pacing Threshold Pulse Width: 0.4 ms
Lead Channel Pacing Threshold Pulse Width: 0.4 ms
Lead Channel Sensing Intrinsic Amplitude: 12 mV
Lead Channel Sensing Intrinsic Amplitude: 2.6 mV
Lead Channel Setting Pacing Amplitude: 2 V
Lead Channel Setting Pacing Amplitude: 2.5 V
Lead Channel Setting Pacing Pulse Width: 0.4 ms
Lead Channel Setting Sensing Sensitivity: 2 mV
Pulse Gen Model: 2272
Pulse Gen Serial Number: 3910619

## 2022-11-30 NOTE — Patient Instructions (Signed)
Medication Instructions:  Your physician recommends that you continue on your current medications as directed. Please refer to the Current Medication list given to you today.  *If you need a refill on your cardiac medications before your next appointment, please call your pharmacy*   Lab Work: TODAY: BMET, CBC  If you have labs (blood work) drawn today and your tests are completely normal, you will receive your results only by: [place] (if you have MyChart) OR A paper copy in the mail If you have any lab test that is abnormal or we need to change your treatment, we will call you to review the results.   Follow-Up: At [facility] you and your health needs are our priority.  As part of our continuing mission to provide you with exceptional heart care, we have created designated Provider Care Teams.  These Care Teams include your primary Cardiologist (physician) and Advanced Practice Providers (APPs -  Physician Assistants and Nurse Practitioners) who all work together to provide you with the care you need, when you need it.   Your next appointment:   6 month(s)  Provider:   Legrand Como "Oda Kilts, PA-C

## 2022-12-01 LAB — CBC
Hematocrit: 37.6 % (ref 34.0–46.6)
Hemoglobin: 12.5 g/dL (ref 11.1–15.9)
MCH: 28.5 pg (ref 26.6–33.0)
MCHC: 33.2 g/dL (ref 31.5–35.7)
MCV: 86 fL (ref 79–97)
Platelets: 161 10*3/uL (ref 150–450)
RBC: 4.38 x10E6/uL (ref 3.77–5.28)
RDW: 14.2 % (ref 11.7–15.4)
WBC: 3 10*3/uL — ABNORMAL LOW (ref 3.4–10.8)

## 2022-12-01 LAB — BASIC METABOLIC PANEL
BUN/Creatinine Ratio: 18 (ref 12–28)
BUN: 15 mg/dL (ref 10–36)
CO2: 26 mmol/L (ref 20–29)
Calcium: 9.3 mg/dL (ref 8.7–10.3)
Chloride: 103 mmol/L (ref 96–106)
Creatinine, Ser: 0.85 mg/dL (ref 0.57–1.00)
Glucose: 109 mg/dL — ABNORMAL HIGH (ref 70–99)
Potassium: 3.8 mmol/L (ref 3.5–5.2)
Sodium: 141 mmol/L (ref 134–144)
eGFR: 63 mL/min/{1.73_m2} (ref >59)

## 2022-12-02 ENCOUNTER — Telehealth: Payer: Self-pay | Admitting: Internal Medicine

## 2022-12-02 DIAGNOSIS — Z90710 Acquired absence of both cervix and uterus: Secondary | ICD-10-CM | POA: Diagnosis not present

## 2022-12-02 DIAGNOSIS — M519 Unspecified thoracic, thoracolumbar and lumbosacral intervertebral disc disorder: Secondary | ICD-10-CM | POA: Diagnosis not present

## 2022-12-02 DIAGNOSIS — Z950 Presence of cardiac pacemaker: Secondary | ICD-10-CM | POA: Diagnosis not present

## 2022-12-02 DIAGNOSIS — Z86711 Personal history of pulmonary embolism: Secondary | ICD-10-CM | POA: Diagnosis not present

## 2022-12-02 DIAGNOSIS — Z86011 Personal history of benign neoplasm of the brain: Secondary | ICD-10-CM | POA: Diagnosis not present

## 2022-12-02 DIAGNOSIS — Z96651 Presence of right artificial knee joint: Secondary | ICD-10-CM | POA: Diagnosis not present

## 2022-12-02 DIAGNOSIS — I441 Atrioventricular block, second degree: Secondary | ICD-10-CM | POA: Diagnosis not present

## 2022-12-02 DIAGNOSIS — Z8719 Personal history of other diseases of the digestive system: Secondary | ICD-10-CM | POA: Diagnosis not present

## 2022-12-02 DIAGNOSIS — Z86718 Personal history of other venous thrombosis and embolism: Secondary | ICD-10-CM | POA: Diagnosis not present

## 2022-12-02 DIAGNOSIS — E890 Postprocedural hypothyroidism: Secondary | ICD-10-CM | POA: Diagnosis not present

## 2022-12-02 DIAGNOSIS — M4800 Spinal stenosis, site unspecified: Secondary | ICD-10-CM | POA: Diagnosis not present

## 2022-12-02 DIAGNOSIS — Z9089 Acquired absence of other organs: Secondary | ICD-10-CM | POA: Diagnosis not present

## 2022-12-02 DIAGNOSIS — Z87311 Personal history of (healed) other pathological fracture: Secondary | ICD-10-CM | POA: Diagnosis not present

## 2022-12-02 DIAGNOSIS — M5410 Radiculopathy, site unspecified: Secondary | ICD-10-CM | POA: Diagnosis not present

## 2022-12-02 DIAGNOSIS — Z9181 History of falling: Secondary | ICD-10-CM | POA: Diagnosis not present

## 2022-12-02 NOTE — Telephone Encounter (Signed)
[name] with [facility] states this morning during physical therapy patient was very lethargic and complaining of a headache on the right side of her head. She also states that she checked her HR a total of 3 times and it was 92 at rest. She states the patient is just overall not welling well today. She requested a call back to discuss. She also mentions that a call may be returned to the patient.

## 2022-12-02 NOTE — Telephone Encounter (Signed)
Spoke with home health, PT and advised pt's HR in the office on 11/30/2022 was 91.  Pt's fluid volume status was noted to ok as well at that appointment.  She reports pt did take Tylenol 33m tablet for her headache one hour prior to therapy without improvement.  She states pt stated she could not tall if Tylenol had helped and she just didn't feel well today.  Encouraged PT to contact PCP office regarding pt's headache otherwise continue current medications as prescribed and call office if pt develops CP, SOB, dizziness/fainting or edema that does not resolve with increased Lasix as instructed.  PT verbalizes understanding and thanked RTherapist, sportsfor the callback.

## 2022-12-05 DIAGNOSIS — E039 Hypothyroidism, unspecified: Secondary | ICD-10-CM | POA: Diagnosis not present

## 2022-12-05 DIAGNOSIS — Z86711 Personal history of pulmonary embolism: Secondary | ICD-10-CM | POA: Diagnosis not present

## 2022-12-05 DIAGNOSIS — I6930 Unspecified sequelae of cerebral infarction: Secondary | ICD-10-CM | POA: Diagnosis not present

## 2022-12-05 DIAGNOSIS — Z7901 Long term (current) use of anticoagulants: Secondary | ICD-10-CM | POA: Diagnosis not present

## 2022-12-07 DIAGNOSIS — M5410 Radiculopathy, site unspecified: Secondary | ICD-10-CM | POA: Diagnosis not present

## 2022-12-07 DIAGNOSIS — I69398 Other sequelae of cerebral infarction: Secondary | ICD-10-CM | POA: Diagnosis not present

## 2022-12-07 DIAGNOSIS — M519 Unspecified thoracic, thoracolumbar and lumbosacral intervertebral disc disorder: Secondary | ICD-10-CM | POA: Diagnosis not present

## 2022-12-07 DIAGNOSIS — I441 Atrioventricular block, second degree: Secondary | ICD-10-CM | POA: Diagnosis not present

## 2022-12-07 DIAGNOSIS — Z86718 Personal history of other venous thrombosis and embolism: Secondary | ICD-10-CM | POA: Diagnosis not present

## 2022-12-07 DIAGNOSIS — Z950 Presence of cardiac pacemaker: Secondary | ICD-10-CM | POA: Diagnosis not present

## 2022-12-07 DIAGNOSIS — Z9181 History of falling: Secondary | ICD-10-CM | POA: Diagnosis not present

## 2022-12-07 DIAGNOSIS — Z79891 Long term (current) use of opiate analgesic: Secondary | ICD-10-CM | POA: Diagnosis not present

## 2022-12-07 DIAGNOSIS — E890 Postprocedural hypothyroidism: Secondary | ICD-10-CM | POA: Diagnosis not present

## 2022-12-07 DIAGNOSIS — I10 Essential (primary) hypertension: Secondary | ICD-10-CM | POA: Diagnosis not present

## 2022-12-07 DIAGNOSIS — Z86011 Personal history of benign neoplasm of the brain: Secondary | ICD-10-CM | POA: Diagnosis not present

## 2022-12-07 DIAGNOSIS — G629 Polyneuropathy, unspecified: Secondary | ICD-10-CM | POA: Diagnosis not present

## 2022-12-07 DIAGNOSIS — M4800 Spinal stenosis, site unspecified: Secondary | ICD-10-CM | POA: Diagnosis not present

## 2022-12-07 DIAGNOSIS — Z7901 Long term (current) use of anticoagulants: Secondary | ICD-10-CM | POA: Diagnosis not present

## 2022-12-07 DIAGNOSIS — H5440 Blindness, one eye, unspecified eye: Secondary | ICD-10-CM | POA: Diagnosis not present

## 2022-12-07 DIAGNOSIS — Z86711 Personal history of pulmonary embolism: Secondary | ICD-10-CM | POA: Diagnosis not present

## 2022-12-12 DIAGNOSIS — M519 Unspecified thoracic, thoracolumbar and lumbosacral intervertebral disc disorder: Secondary | ICD-10-CM | POA: Diagnosis not present

## 2022-12-12 DIAGNOSIS — I69398 Other sequelae of cerebral infarction: Secondary | ICD-10-CM | POA: Diagnosis not present

## 2022-12-12 DIAGNOSIS — M5410 Radiculopathy, site unspecified: Secondary | ICD-10-CM | POA: Diagnosis not present

## 2022-12-12 DIAGNOSIS — M4800 Spinal stenosis, site unspecified: Secondary | ICD-10-CM | POA: Diagnosis not present

## 2022-12-14 DIAGNOSIS — I441 Atrioventricular block, second degree: Secondary | ICD-10-CM | POA: Diagnosis not present

## 2022-12-14 DIAGNOSIS — G629 Polyneuropathy, unspecified: Secondary | ICD-10-CM | POA: Diagnosis not present

## 2022-12-14 DIAGNOSIS — M5410 Radiculopathy, site unspecified: Secondary | ICD-10-CM | POA: Diagnosis not present

## 2022-12-14 DIAGNOSIS — Z9181 History of falling: Secondary | ICD-10-CM | POA: Diagnosis not present

## 2022-12-14 DIAGNOSIS — Z950 Presence of cardiac pacemaker: Secondary | ICD-10-CM | POA: Diagnosis not present

## 2022-12-14 DIAGNOSIS — Z79891 Long term (current) use of opiate analgesic: Secondary | ICD-10-CM | POA: Diagnosis not present

## 2022-12-14 DIAGNOSIS — I69398 Other sequelae of cerebral infarction: Secondary | ICD-10-CM | POA: Diagnosis not present

## 2022-12-14 DIAGNOSIS — Z86711 Personal history of pulmonary embolism: Secondary | ICD-10-CM | POA: Diagnosis not present

## 2022-12-14 DIAGNOSIS — M4800 Spinal stenosis, site unspecified: Secondary | ICD-10-CM | POA: Diagnosis not present

## 2022-12-14 DIAGNOSIS — E890 Postprocedural hypothyroidism: Secondary | ICD-10-CM | POA: Diagnosis not present

## 2022-12-14 DIAGNOSIS — M519 Unspecified thoracic, thoracolumbar and lumbosacral intervertebral disc disorder: Secondary | ICD-10-CM | POA: Diagnosis not present

## 2022-12-14 DIAGNOSIS — Z86718 Personal history of other venous thrombosis and embolism: Secondary | ICD-10-CM | POA: Diagnosis not present

## 2022-12-14 DIAGNOSIS — Z7901 Long term (current) use of anticoagulants: Secondary | ICD-10-CM | POA: Diagnosis not present

## 2022-12-14 DIAGNOSIS — Z86011 Personal history of benign neoplasm of the brain: Secondary | ICD-10-CM | POA: Diagnosis not present

## 2022-12-14 DIAGNOSIS — I10 Essential (primary) hypertension: Secondary | ICD-10-CM | POA: Diagnosis not present

## 2022-12-14 DIAGNOSIS — H5440 Blindness, one eye, unspecified eye: Secondary | ICD-10-CM | POA: Diagnosis not present

## 2022-12-19 DIAGNOSIS — M4800 Spinal stenosis, site unspecified: Secondary | ICD-10-CM | POA: Diagnosis not present

## 2022-12-19 DIAGNOSIS — I441 Atrioventricular block, second degree: Secondary | ICD-10-CM | POA: Diagnosis not present

## 2022-12-19 DIAGNOSIS — I10 Essential (primary) hypertension: Secondary | ICD-10-CM | POA: Diagnosis not present

## 2022-12-19 DIAGNOSIS — Z86011 Personal history of benign neoplasm of the brain: Secondary | ICD-10-CM | POA: Diagnosis not present

## 2022-12-19 DIAGNOSIS — M5410 Radiculopathy, site unspecified: Secondary | ICD-10-CM | POA: Diagnosis not present

## 2022-12-19 DIAGNOSIS — G629 Polyneuropathy, unspecified: Secondary | ICD-10-CM | POA: Diagnosis not present

## 2022-12-19 DIAGNOSIS — Z9181 History of falling: Secondary | ICD-10-CM | POA: Diagnosis not present

## 2022-12-19 DIAGNOSIS — Z86718 Personal history of other venous thrombosis and embolism: Secondary | ICD-10-CM | POA: Diagnosis not present

## 2022-12-19 DIAGNOSIS — Z7901 Long term (current) use of anticoagulants: Secondary | ICD-10-CM | POA: Diagnosis not present

## 2022-12-19 DIAGNOSIS — E890 Postprocedural hypothyroidism: Secondary | ICD-10-CM | POA: Diagnosis not present

## 2022-12-19 DIAGNOSIS — H5440 Blindness, one eye, unspecified eye: Secondary | ICD-10-CM | POA: Diagnosis not present

## 2022-12-19 DIAGNOSIS — Z950 Presence of cardiac pacemaker: Secondary | ICD-10-CM | POA: Diagnosis not present

## 2022-12-19 DIAGNOSIS — M519 Unspecified thoracic, thoracolumbar and lumbosacral intervertebral disc disorder: Secondary | ICD-10-CM | POA: Diagnosis not present

## 2022-12-19 DIAGNOSIS — I69398 Other sequelae of cerebral infarction: Secondary | ICD-10-CM | POA: Diagnosis not present

## 2022-12-19 DIAGNOSIS — Z86711 Personal history of pulmonary embolism: Secondary | ICD-10-CM | POA: Diagnosis not present

## 2022-12-19 DIAGNOSIS — Z79891 Long term (current) use of opiate analgesic: Secondary | ICD-10-CM | POA: Diagnosis not present

## 2022-12-27 DIAGNOSIS — I10 Essential (primary) hypertension: Secondary | ICD-10-CM | POA: Diagnosis not present

## 2022-12-27 DIAGNOSIS — M5410 Radiculopathy, site unspecified: Secondary | ICD-10-CM | POA: Diagnosis not present

## 2022-12-27 DIAGNOSIS — I441 Atrioventricular block, second degree: Secondary | ICD-10-CM | POA: Diagnosis not present

## 2022-12-27 DIAGNOSIS — I69398 Other sequelae of cerebral infarction: Secondary | ICD-10-CM | POA: Diagnosis not present

## 2022-12-27 DIAGNOSIS — Z86718 Personal history of other venous thrombosis and embolism: Secondary | ICD-10-CM | POA: Diagnosis not present

## 2022-12-27 DIAGNOSIS — Z950 Presence of cardiac pacemaker: Secondary | ICD-10-CM | POA: Diagnosis not present

## 2022-12-27 DIAGNOSIS — M519 Unspecified thoracic, thoracolumbar and lumbosacral intervertebral disc disorder: Secondary | ICD-10-CM | POA: Diagnosis not present

## 2022-12-27 DIAGNOSIS — E890 Postprocedural hypothyroidism: Secondary | ICD-10-CM | POA: Diagnosis not present

## 2022-12-27 DIAGNOSIS — Z86711 Personal history of pulmonary embolism: Secondary | ICD-10-CM | POA: Diagnosis not present

## 2022-12-27 DIAGNOSIS — Z9181 History of falling: Secondary | ICD-10-CM | POA: Diagnosis not present

## 2022-12-27 DIAGNOSIS — G629 Polyneuropathy, unspecified: Secondary | ICD-10-CM | POA: Diagnosis not present

## 2022-12-27 DIAGNOSIS — Z86011 Personal history of benign neoplasm of the brain: Secondary | ICD-10-CM | POA: Diagnosis not present

## 2022-12-27 DIAGNOSIS — M4800 Spinal stenosis, site unspecified: Secondary | ICD-10-CM | POA: Diagnosis not present

## 2022-12-27 DIAGNOSIS — Z79891 Long term (current) use of opiate analgesic: Secondary | ICD-10-CM | POA: Diagnosis not present

## 2022-12-27 DIAGNOSIS — Z7901 Long term (current) use of anticoagulants: Secondary | ICD-10-CM | POA: Diagnosis not present

## 2022-12-27 DIAGNOSIS — H5440 Blindness, one eye, unspecified eye: Secondary | ICD-10-CM | POA: Diagnosis not present

## 2023-01-04 DIAGNOSIS — Z86711 Personal history of pulmonary embolism: Secondary | ICD-10-CM | POA: Diagnosis not present

## 2023-01-04 DIAGNOSIS — I10 Essential (primary) hypertension: Secondary | ICD-10-CM | POA: Diagnosis not present

## 2023-01-04 DIAGNOSIS — M4800 Spinal stenosis, site unspecified: Secondary | ICD-10-CM | POA: Diagnosis not present

## 2023-01-04 DIAGNOSIS — M5410 Radiculopathy, site unspecified: Secondary | ICD-10-CM | POA: Diagnosis not present

## 2023-01-04 DIAGNOSIS — Z79891 Long term (current) use of opiate analgesic: Secondary | ICD-10-CM | POA: Diagnosis not present

## 2023-01-04 DIAGNOSIS — H5440 Blindness, one eye, unspecified eye: Secondary | ICD-10-CM | POA: Diagnosis not present

## 2023-01-04 DIAGNOSIS — G629 Polyneuropathy, unspecified: Secondary | ICD-10-CM | POA: Diagnosis not present

## 2023-01-04 DIAGNOSIS — Z7901 Long term (current) use of anticoagulants: Secondary | ICD-10-CM | POA: Diagnosis not present

## 2023-01-04 DIAGNOSIS — Z86011 Personal history of benign neoplasm of the brain: Secondary | ICD-10-CM | POA: Diagnosis not present

## 2023-01-04 DIAGNOSIS — E890 Postprocedural hypothyroidism: Secondary | ICD-10-CM | POA: Diagnosis not present

## 2023-01-04 DIAGNOSIS — Z86718 Personal history of other venous thrombosis and embolism: Secondary | ICD-10-CM | POA: Diagnosis not present

## 2023-01-04 DIAGNOSIS — I69398 Other sequelae of cerebral infarction: Secondary | ICD-10-CM | POA: Diagnosis not present

## 2023-01-04 DIAGNOSIS — M519 Unspecified thoracic, thoracolumbar and lumbosacral intervertebral disc disorder: Secondary | ICD-10-CM | POA: Diagnosis not present

## 2023-01-04 DIAGNOSIS — I441 Atrioventricular block, second degree: Secondary | ICD-10-CM | POA: Diagnosis not present

## 2023-01-04 DIAGNOSIS — Z950 Presence of cardiac pacemaker: Secondary | ICD-10-CM | POA: Diagnosis not present

## 2023-01-04 DIAGNOSIS — Z9181 History of falling: Secondary | ICD-10-CM | POA: Diagnosis not present

## 2023-01-06 DIAGNOSIS — M5410 Radiculopathy, site unspecified: Secondary | ICD-10-CM | POA: Diagnosis not present

## 2023-01-06 DIAGNOSIS — Z950 Presence of cardiac pacemaker: Secondary | ICD-10-CM | POA: Diagnosis not present

## 2023-01-06 DIAGNOSIS — Z79891 Long term (current) use of opiate analgesic: Secondary | ICD-10-CM | POA: Diagnosis not present

## 2023-01-06 DIAGNOSIS — Z86711 Personal history of pulmonary embolism: Secondary | ICD-10-CM | POA: Diagnosis not present

## 2023-01-06 DIAGNOSIS — I10 Essential (primary) hypertension: Secondary | ICD-10-CM | POA: Diagnosis not present

## 2023-01-06 DIAGNOSIS — Z86718 Personal history of other venous thrombosis and embolism: Secondary | ICD-10-CM | POA: Diagnosis not present

## 2023-01-06 DIAGNOSIS — Z86011 Personal history of benign neoplasm of the brain: Secondary | ICD-10-CM | POA: Diagnosis not present

## 2023-01-06 DIAGNOSIS — Z7901 Long term (current) use of anticoagulants: Secondary | ICD-10-CM | POA: Diagnosis not present

## 2023-01-06 DIAGNOSIS — Z9181 History of falling: Secondary | ICD-10-CM | POA: Diagnosis not present

## 2023-01-06 DIAGNOSIS — I69398 Other sequelae of cerebral infarction: Secondary | ICD-10-CM | POA: Diagnosis not present

## 2023-01-06 DIAGNOSIS — H5440 Blindness, one eye, unspecified eye: Secondary | ICD-10-CM | POA: Diagnosis not present

## 2023-01-06 DIAGNOSIS — M4800 Spinal stenosis, site unspecified: Secondary | ICD-10-CM | POA: Diagnosis not present

## 2023-01-06 DIAGNOSIS — M519 Unspecified thoracic, thoracolumbar and lumbosacral intervertebral disc disorder: Secondary | ICD-10-CM | POA: Diagnosis not present

## 2023-01-06 DIAGNOSIS — I441 Atrioventricular block, second degree: Secondary | ICD-10-CM | POA: Diagnosis not present

## 2023-01-06 DIAGNOSIS — E890 Postprocedural hypothyroidism: Secondary | ICD-10-CM | POA: Diagnosis not present

## 2023-01-06 DIAGNOSIS — G629 Polyneuropathy, unspecified: Secondary | ICD-10-CM | POA: Diagnosis not present

## 2023-01-09 ENCOUNTER — Ambulatory Visit (INDEPENDENT_AMBULATORY_CARE_PROVIDER_SITE_OTHER): Payer: Medicare Other

## 2023-01-09 DIAGNOSIS — I4430 Unspecified atrioventricular block: Secondary | ICD-10-CM

## 2023-01-09 DIAGNOSIS — Z950 Presence of cardiac pacemaker: Secondary | ICD-10-CM | POA: Diagnosis not present

## 2023-01-09 DIAGNOSIS — G629 Polyneuropathy, unspecified: Secondary | ICD-10-CM | POA: Diagnosis not present

## 2023-01-09 DIAGNOSIS — M519 Unspecified thoracic, thoracolumbar and lumbosacral intervertebral disc disorder: Secondary | ICD-10-CM | POA: Diagnosis not present

## 2023-01-09 DIAGNOSIS — M5410 Radiculopathy, site unspecified: Secondary | ICD-10-CM | POA: Diagnosis not present

## 2023-01-09 DIAGNOSIS — Z79891 Long term (current) use of opiate analgesic: Secondary | ICD-10-CM | POA: Diagnosis not present

## 2023-01-09 DIAGNOSIS — H5440 Blindness, one eye, unspecified eye: Secondary | ICD-10-CM | POA: Diagnosis not present

## 2023-01-09 DIAGNOSIS — Z86011 Personal history of benign neoplasm of the brain: Secondary | ICD-10-CM | POA: Diagnosis not present

## 2023-01-09 DIAGNOSIS — I441 Atrioventricular block, second degree: Secondary | ICD-10-CM | POA: Diagnosis not present

## 2023-01-09 DIAGNOSIS — I10 Essential (primary) hypertension: Secondary | ICD-10-CM | POA: Diagnosis not present

## 2023-01-09 DIAGNOSIS — Z7901 Long term (current) use of anticoagulants: Secondary | ICD-10-CM | POA: Diagnosis not present

## 2023-01-09 DIAGNOSIS — Z86718 Personal history of other venous thrombosis and embolism: Secondary | ICD-10-CM | POA: Diagnosis not present

## 2023-01-09 DIAGNOSIS — E890 Postprocedural hypothyroidism: Secondary | ICD-10-CM | POA: Diagnosis not present

## 2023-01-09 DIAGNOSIS — Z9181 History of falling: Secondary | ICD-10-CM | POA: Diagnosis not present

## 2023-01-09 DIAGNOSIS — I69398 Other sequelae of cerebral infarction: Secondary | ICD-10-CM | POA: Diagnosis not present

## 2023-01-09 DIAGNOSIS — M4800 Spinal stenosis, site unspecified: Secondary | ICD-10-CM | POA: Diagnosis not present

## 2023-01-09 DIAGNOSIS — Z86711 Personal history of pulmonary embolism: Secondary | ICD-10-CM | POA: Diagnosis not present

## 2023-01-09 LAB — CUP PACEART REMOTE DEVICE CHECK
Battery Remaining Longevity: 111 mo
Battery Remaining Percentage: 87 %
Battery Voltage: 3.02 V
Brady Statistic AP VP Percent: 1 %
Brady Statistic AP VS Percent: 1 %
Brady Statistic AS VP Percent: 0 %
Brady Statistic AS VS Percent: 99 %
Brady Statistic RA Percent Paced: 1 %
Brady Statistic RV Percent Paced: 1 %
Date Time Interrogation Session: 20240318020014
Implantable Lead Connection Status: 753985
Implantable Lead Connection Status: 753985
Implantable Lead Implant Date: 20220321
Implantable Lead Implant Date: 20220321
Implantable Lead Location: 753859
Implantable Lead Location: 753860
Implantable Pulse Generator Implant Date: 20220321
Lead Channel Impedance Value: 460 Ohm
Lead Channel Impedance Value: 580 Ohm
Lead Channel Pacing Threshold Amplitude: 0.75 V
Lead Channel Pacing Threshold Amplitude: 0.75 V
Lead Channel Pacing Threshold Pulse Width: 0.4 ms
Lead Channel Pacing Threshold Pulse Width: 0.4 ms
Lead Channel Sensing Intrinsic Amplitude: 12 mV
Lead Channel Sensing Intrinsic Amplitude: 3.1 mV
Lead Channel Setting Pacing Amplitude: 2 V
Lead Channel Setting Pacing Amplitude: 2.5 V
Lead Channel Setting Pacing Pulse Width: 0.4 ms
Lead Channel Setting Sensing Sensitivity: 2 mV
Pulse Gen Model: 2272
Pulse Gen Serial Number: 3910619

## 2023-01-14 ENCOUNTER — Telehealth: Payer: Self-pay | Admitting: *Deleted

## 2023-01-14 NOTE — Patient Outreach (Signed)
  Care Coordination   01/14/2023 Name: [name] MRN: [id number] DOB: 1927/01/21   Care Coordination Outreach Attempts:  An unsuccessful telephone outreach was attempted today to offer the patient information about available care coordination services as a benefit of their health plan.   Follow Up Plan:  Additional outreach attempts will be made to offer the patient care coordination information and services.   Encounter Outcome:  No Answer   Care Coordination Interventions:  No, not indicated    Eduard Clos, MSW, [place] Worker [employer] [phone]

## 2023-01-16 DIAGNOSIS — Z86718 Personal history of other venous thrombosis and embolism: Secondary | ICD-10-CM | POA: Diagnosis not present

## 2023-01-16 DIAGNOSIS — Z86011 Personal history of benign neoplasm of the brain: Secondary | ICD-10-CM | POA: Diagnosis not present

## 2023-01-16 DIAGNOSIS — Z79891 Long term (current) use of opiate analgesic: Secondary | ICD-10-CM | POA: Diagnosis not present

## 2023-01-16 DIAGNOSIS — G629 Polyneuropathy, unspecified: Secondary | ICD-10-CM | POA: Diagnosis not present

## 2023-01-16 DIAGNOSIS — Z950 Presence of cardiac pacemaker: Secondary | ICD-10-CM | POA: Diagnosis not present

## 2023-01-16 DIAGNOSIS — I69398 Other sequelae of cerebral infarction: Secondary | ICD-10-CM | POA: Diagnosis not present

## 2023-01-16 DIAGNOSIS — I10 Essential (primary) hypertension: Secondary | ICD-10-CM | POA: Diagnosis not present

## 2023-01-16 DIAGNOSIS — E890 Postprocedural hypothyroidism: Secondary | ICD-10-CM | POA: Diagnosis not present

## 2023-01-16 DIAGNOSIS — M5410 Radiculopathy, site unspecified: Secondary | ICD-10-CM | POA: Diagnosis not present

## 2023-01-16 DIAGNOSIS — M519 Unspecified thoracic, thoracolumbar and lumbosacral intervertebral disc disorder: Secondary | ICD-10-CM | POA: Diagnosis not present

## 2023-01-16 DIAGNOSIS — Z9181 History of falling: Secondary | ICD-10-CM | POA: Diagnosis not present

## 2023-01-16 DIAGNOSIS — I441 Atrioventricular block, second degree: Secondary | ICD-10-CM | POA: Diagnosis not present

## 2023-01-16 DIAGNOSIS — H5440 Blindness, one eye, unspecified eye: Secondary | ICD-10-CM | POA: Diagnosis not present

## 2023-01-16 DIAGNOSIS — M4800 Spinal stenosis, site unspecified: Secondary | ICD-10-CM | POA: Diagnosis not present

## 2023-01-16 DIAGNOSIS — Z7901 Long term (current) use of anticoagulants: Secondary | ICD-10-CM | POA: Diagnosis not present

## 2023-01-16 DIAGNOSIS — Z86711 Personal history of pulmonary embolism: Secondary | ICD-10-CM | POA: Diagnosis not present

## 2023-01-18 DIAGNOSIS — Z961 Presence of intraocular lens: Secondary | ICD-10-CM | POA: Diagnosis not present

## 2023-01-18 DIAGNOSIS — H43812 Vitreous degeneration, left eye: Secondary | ICD-10-CM | POA: Diagnosis not present

## 2023-01-18 DIAGNOSIS — H04123 Dry eye syndrome of bilateral lacrimal glands: Secondary | ICD-10-CM | POA: Diagnosis not present

## 2023-01-26 DIAGNOSIS — E890 Postprocedural hypothyroidism: Secondary | ICD-10-CM | POA: Diagnosis not present

## 2023-01-26 DIAGNOSIS — M4800 Spinal stenosis, site unspecified: Secondary | ICD-10-CM | POA: Diagnosis not present

## 2023-01-26 DIAGNOSIS — Z86011 Personal history of benign neoplasm of the brain: Secondary | ICD-10-CM | POA: Diagnosis not present

## 2023-01-26 DIAGNOSIS — H5440 Blindness, one eye, unspecified eye: Secondary | ICD-10-CM | POA: Diagnosis not present

## 2023-01-26 DIAGNOSIS — I10 Essential (primary) hypertension: Secondary | ICD-10-CM | POA: Diagnosis not present

## 2023-01-26 DIAGNOSIS — M5410 Radiculopathy, site unspecified: Secondary | ICD-10-CM | POA: Diagnosis not present

## 2023-01-26 DIAGNOSIS — I441 Atrioventricular block, second degree: Secondary | ICD-10-CM | POA: Diagnosis not present

## 2023-01-26 DIAGNOSIS — Z950 Presence of cardiac pacemaker: Secondary | ICD-10-CM | POA: Diagnosis not present

## 2023-01-26 DIAGNOSIS — I69398 Other sequelae of cerebral infarction: Secondary | ICD-10-CM | POA: Diagnosis not present

## 2023-01-26 DIAGNOSIS — Z9181 History of falling: Secondary | ICD-10-CM | POA: Diagnosis not present

## 2023-01-26 DIAGNOSIS — Z86718 Personal history of other venous thrombosis and embolism: Secondary | ICD-10-CM | POA: Diagnosis not present

## 2023-01-26 DIAGNOSIS — G629 Polyneuropathy, unspecified: Secondary | ICD-10-CM | POA: Diagnosis not present

## 2023-01-26 DIAGNOSIS — Z86711 Personal history of pulmonary embolism: Secondary | ICD-10-CM | POA: Diagnosis not present

## 2023-01-26 DIAGNOSIS — Z79891 Long term (current) use of opiate analgesic: Secondary | ICD-10-CM | POA: Diagnosis not present

## 2023-01-26 DIAGNOSIS — Z7901 Long term (current) use of anticoagulants: Secondary | ICD-10-CM | POA: Diagnosis not present

## 2023-01-26 DIAGNOSIS — M519 Unspecified thoracic, thoracolumbar and lumbosacral intervertebral disc disorder: Secondary | ICD-10-CM | POA: Diagnosis not present

## 2023-02-01 DIAGNOSIS — G629 Polyneuropathy, unspecified: Secondary | ICD-10-CM | POA: Diagnosis not present

## 2023-02-01 DIAGNOSIS — Z79891 Long term (current) use of opiate analgesic: Secondary | ICD-10-CM | POA: Diagnosis not present

## 2023-02-01 DIAGNOSIS — Z86011 Personal history of benign neoplasm of the brain: Secondary | ICD-10-CM | POA: Diagnosis not present

## 2023-02-01 DIAGNOSIS — M5410 Radiculopathy, site unspecified: Secondary | ICD-10-CM | POA: Diagnosis not present

## 2023-02-01 DIAGNOSIS — M519 Unspecified thoracic, thoracolumbar and lumbosacral intervertebral disc disorder: Secondary | ICD-10-CM | POA: Diagnosis not present

## 2023-02-01 DIAGNOSIS — Z9181 History of falling: Secondary | ICD-10-CM | POA: Diagnosis not present

## 2023-02-01 DIAGNOSIS — Z86711 Personal history of pulmonary embolism: Secondary | ICD-10-CM | POA: Diagnosis not present

## 2023-02-01 DIAGNOSIS — I69398 Other sequelae of cerebral infarction: Secondary | ICD-10-CM | POA: Diagnosis not present

## 2023-02-01 DIAGNOSIS — M4800 Spinal stenosis, site unspecified: Secondary | ICD-10-CM | POA: Diagnosis not present

## 2023-02-01 DIAGNOSIS — I10 Essential (primary) hypertension: Secondary | ICD-10-CM | POA: Diagnosis not present

## 2023-02-01 DIAGNOSIS — E890 Postprocedural hypothyroidism: Secondary | ICD-10-CM | POA: Diagnosis not present

## 2023-02-01 DIAGNOSIS — H5440 Blindness, one eye, unspecified eye: Secondary | ICD-10-CM | POA: Diagnosis not present

## 2023-02-01 DIAGNOSIS — Z86718 Personal history of other venous thrombosis and embolism: Secondary | ICD-10-CM | POA: Diagnosis not present

## 2023-02-01 DIAGNOSIS — Z950 Presence of cardiac pacemaker: Secondary | ICD-10-CM | POA: Diagnosis not present

## 2023-02-01 DIAGNOSIS — I441 Atrioventricular block, second degree: Secondary | ICD-10-CM | POA: Diagnosis not present

## 2023-02-01 DIAGNOSIS — Z7901 Long term (current) use of anticoagulants: Secondary | ICD-10-CM | POA: Diagnosis not present

## 2023-02-15 NOTE — Progress Notes (Signed)
Remote pacemaker transmission.   

## 2023-02-17 DIAGNOSIS — J9811 Atelectasis: Secondary | ICD-10-CM | POA: Diagnosis not present

## 2023-02-17 DIAGNOSIS — Z7409 Other reduced mobility: Secondary | ICD-10-CM | POA: Diagnosis not present

## 2023-02-17 DIAGNOSIS — Z950 Presence of cardiac pacemaker: Secondary | ICD-10-CM | POA: Diagnosis not present

## 2023-02-17 DIAGNOSIS — I10 Essential (primary) hypertension: Secondary | ICD-10-CM | POA: Diagnosis not present

## 2023-02-17 DIAGNOSIS — R54 Age-related physical debility: Secondary | ICD-10-CM | POA: Diagnosis not present

## 2023-02-17 DIAGNOSIS — R0602 Shortness of breath: Secondary | ICD-10-CM | POA: Diagnosis not present

## 2023-02-17 DIAGNOSIS — R001 Bradycardia, unspecified: Secondary | ICD-10-CM | POA: Diagnosis not present

## 2023-02-17 DIAGNOSIS — M79605 Pain in left leg: Secondary | ICD-10-CM | POA: Diagnosis not present

## 2023-02-17 DIAGNOSIS — I4719 Other supraventricular tachycardia: Secondary | ICD-10-CM | POA: Diagnosis not present

## 2023-02-17 DIAGNOSIS — Z66 Do not resuscitate: Secondary | ICD-10-CM | POA: Diagnosis not present

## 2023-02-17 DIAGNOSIS — I4510 Unspecified right bundle-branch block: Secondary | ICD-10-CM | POA: Diagnosis not present

## 2023-02-17 DIAGNOSIS — E039 Hypothyroidism, unspecified: Secondary | ICD-10-CM | POA: Diagnosis not present

## 2023-02-17 DIAGNOSIS — K219 Gastro-esophageal reflux disease without esophagitis: Secondary | ICD-10-CM | POA: Diagnosis not present

## 2023-02-17 DIAGNOSIS — Z86711 Personal history of pulmonary embolism: Secondary | ICD-10-CM | POA: Diagnosis not present

## 2023-02-17 DIAGNOSIS — R079 Chest pain, unspecified: Secondary | ICD-10-CM | POA: Diagnosis not present

## 2023-02-17 DIAGNOSIS — H5461 Unqualified visual loss, right eye, normal vision left eye: Secondary | ICD-10-CM | POA: Diagnosis not present

## 2023-02-17 DIAGNOSIS — Z7901 Long term (current) use of anticoagulants: Secondary | ICD-10-CM | POA: Diagnosis not present

## 2023-02-17 DIAGNOSIS — Z86718 Personal history of other venous thrombosis and embolism: Secondary | ICD-10-CM | POA: Diagnosis not present

## 2023-02-17 DIAGNOSIS — I340 Nonrheumatic mitral (valve) insufficiency: Secondary | ICD-10-CM | POA: Diagnosis not present

## 2023-02-17 DIAGNOSIS — Z79890 Hormone replacement therapy (postmenopausal): Secondary | ICD-10-CM | POA: Diagnosis not present

## 2023-02-17 DIAGNOSIS — I517 Cardiomegaly: Secondary | ICD-10-CM | POA: Diagnosis not present

## 2023-02-17 DIAGNOSIS — I452 Bifascicular block: Secondary | ICD-10-CM | POA: Diagnosis not present

## 2023-02-17 DIAGNOSIS — I214 Non-ST elevation (NSTEMI) myocardial infarction: Secondary | ICD-10-CM | POA: Diagnosis not present

## 2023-02-17 DIAGNOSIS — R0789 Other chest pain: Secondary | ICD-10-CM | POA: Diagnosis not present

## 2023-02-18 DIAGNOSIS — R001 Bradycardia, unspecified: Secondary | ICD-10-CM | POA: Diagnosis not present

## 2023-02-18 DIAGNOSIS — Z86718 Personal history of other venous thrombosis and embolism: Secondary | ICD-10-CM | POA: Diagnosis not present

## 2023-02-18 DIAGNOSIS — I214 Non-ST elevation (NSTEMI) myocardial infarction: Secondary | ICD-10-CM | POA: Diagnosis not present

## 2023-02-18 DIAGNOSIS — Z950 Presence of cardiac pacemaker: Secondary | ICD-10-CM | POA: Diagnosis not present

## 2023-02-24 DIAGNOSIS — Z7901 Long term (current) use of anticoagulants: Secondary | ICD-10-CM | POA: Diagnosis not present

## 2023-02-24 DIAGNOSIS — I214 Non-ST elevation (NSTEMI) myocardial infarction: Secondary | ICD-10-CM | POA: Diagnosis not present

## 2023-02-24 DIAGNOSIS — H5461 Unqualified visual loss, right eye, normal vision left eye: Secondary | ICD-10-CM | POA: Diagnosis not present

## 2023-02-24 DIAGNOSIS — I4891 Unspecified atrial fibrillation: Secondary | ICD-10-CM | POA: Diagnosis not present

## 2023-02-24 DIAGNOSIS — I10 Essential (primary) hypertension: Secondary | ICD-10-CM | POA: Diagnosis not present

## 2023-02-24 DIAGNOSIS — Z7902 Long term (current) use of antithrombotics/antiplatelets: Secondary | ICD-10-CM | POA: Diagnosis not present

## 2023-02-24 DIAGNOSIS — K219 Gastro-esophageal reflux disease without esophagitis: Secondary | ICD-10-CM | POA: Diagnosis not present

## 2023-02-24 DIAGNOSIS — E039 Hypothyroidism, unspecified: Secondary | ICD-10-CM | POA: Diagnosis not present

## 2023-03-01 DIAGNOSIS — I4891 Unspecified atrial fibrillation: Secondary | ICD-10-CM | POA: Diagnosis not present

## 2023-03-01 DIAGNOSIS — I10 Essential (primary) hypertension: Secondary | ICD-10-CM | POA: Diagnosis not present

## 2023-03-01 DIAGNOSIS — E039 Hypothyroidism, unspecified: Secondary | ICD-10-CM | POA: Diagnosis not present

## 2023-03-01 DIAGNOSIS — I214 Non-ST elevation (NSTEMI) myocardial infarction: Secondary | ICD-10-CM | POA: Diagnosis not present

## 2023-03-01 DIAGNOSIS — K219 Gastro-esophageal reflux disease without esophagitis: Secondary | ICD-10-CM | POA: Diagnosis not present

## 2023-03-01 DIAGNOSIS — H5461 Unqualified visual loss, right eye, normal vision left eye: Secondary | ICD-10-CM | POA: Diagnosis not present

## 2023-03-01 DIAGNOSIS — Z7902 Long term (current) use of antithrombotics/antiplatelets: Secondary | ICD-10-CM | POA: Diagnosis not present

## 2023-03-01 DIAGNOSIS — Z7901 Long term (current) use of anticoagulants: Secondary | ICD-10-CM | POA: Diagnosis not present

## 2023-03-02 DIAGNOSIS — I6930 Unspecified sequelae of cerebral infarction: Secondary | ICD-10-CM | POA: Diagnosis not present

## 2023-03-02 DIAGNOSIS — Z09 Encounter for follow-up examination after completed treatment for conditions other than malignant neoplasm: Secondary | ICD-10-CM | POA: Diagnosis not present

## 2023-03-02 DIAGNOSIS — I2510 Atherosclerotic heart disease of native coronary artery without angina pectoris: Secondary | ICD-10-CM | POA: Diagnosis not present

## 2023-03-02 DIAGNOSIS — I252 Old myocardial infarction: Secondary | ICD-10-CM | POA: Diagnosis not present

## 2023-03-03 DIAGNOSIS — Z7902 Long term (current) use of antithrombotics/antiplatelets: Secondary | ICD-10-CM | POA: Diagnosis not present

## 2023-03-03 DIAGNOSIS — I4891 Unspecified atrial fibrillation: Secondary | ICD-10-CM | POA: Diagnosis not present

## 2023-03-03 DIAGNOSIS — I10 Essential (primary) hypertension: Secondary | ICD-10-CM | POA: Diagnosis not present

## 2023-03-03 DIAGNOSIS — E039 Hypothyroidism, unspecified: Secondary | ICD-10-CM | POA: Diagnosis not present

## 2023-03-03 DIAGNOSIS — H5461 Unqualified visual loss, right eye, normal vision left eye: Secondary | ICD-10-CM | POA: Diagnosis not present

## 2023-03-03 DIAGNOSIS — K219 Gastro-esophageal reflux disease without esophagitis: Secondary | ICD-10-CM | POA: Diagnosis not present

## 2023-03-03 DIAGNOSIS — I214 Non-ST elevation (NSTEMI) myocardial infarction: Secondary | ICD-10-CM | POA: Diagnosis not present

## 2023-03-03 DIAGNOSIS — Z7901 Long term (current) use of anticoagulants: Secondary | ICD-10-CM | POA: Diagnosis not present

## 2023-03-07 DIAGNOSIS — Z7901 Long term (current) use of anticoagulants: Secondary | ICD-10-CM | POA: Diagnosis not present

## 2023-03-07 DIAGNOSIS — H5461 Unqualified visual loss, right eye, normal vision left eye: Secondary | ICD-10-CM | POA: Diagnosis not present

## 2023-03-07 DIAGNOSIS — I4891 Unspecified atrial fibrillation: Secondary | ICD-10-CM | POA: Diagnosis not present

## 2023-03-07 DIAGNOSIS — Z7902 Long term (current) use of antithrombotics/antiplatelets: Secondary | ICD-10-CM | POA: Diagnosis not present

## 2023-03-07 DIAGNOSIS — I10 Essential (primary) hypertension: Secondary | ICD-10-CM | POA: Diagnosis not present

## 2023-03-07 DIAGNOSIS — E039 Hypothyroidism, unspecified: Secondary | ICD-10-CM | POA: Diagnosis not present

## 2023-03-07 DIAGNOSIS — K219 Gastro-esophageal reflux disease without esophagitis: Secondary | ICD-10-CM | POA: Diagnosis not present

## 2023-03-07 DIAGNOSIS — I214 Non-ST elevation (NSTEMI) myocardial infarction: Secondary | ICD-10-CM | POA: Diagnosis not present

## 2023-03-08 DIAGNOSIS — H5461 Unqualified visual loss, right eye, normal vision left eye: Secondary | ICD-10-CM | POA: Diagnosis not present

## 2023-03-08 DIAGNOSIS — I10 Essential (primary) hypertension: Secondary | ICD-10-CM | POA: Diagnosis not present

## 2023-03-08 DIAGNOSIS — I214 Non-ST elevation (NSTEMI) myocardial infarction: Secondary | ICD-10-CM | POA: Diagnosis not present

## 2023-03-08 DIAGNOSIS — K219 Gastro-esophageal reflux disease without esophagitis: Secondary | ICD-10-CM | POA: Diagnosis not present

## 2023-03-08 DIAGNOSIS — Z7902 Long term (current) use of antithrombotics/antiplatelets: Secondary | ICD-10-CM | POA: Diagnosis not present

## 2023-03-08 DIAGNOSIS — E039 Hypothyroidism, unspecified: Secondary | ICD-10-CM | POA: Diagnosis not present

## 2023-03-08 DIAGNOSIS — I4891 Unspecified atrial fibrillation: Secondary | ICD-10-CM | POA: Diagnosis not present

## 2023-03-08 DIAGNOSIS — Z7901 Long term (current) use of anticoagulants: Secondary | ICD-10-CM | POA: Diagnosis not present

## 2023-03-09 DIAGNOSIS — Z7902 Long term (current) use of antithrombotics/antiplatelets: Secondary | ICD-10-CM | POA: Diagnosis not present

## 2023-03-09 DIAGNOSIS — Z7901 Long term (current) use of anticoagulants: Secondary | ICD-10-CM | POA: Diagnosis not present

## 2023-03-09 DIAGNOSIS — I214 Non-ST elevation (NSTEMI) myocardial infarction: Secondary | ICD-10-CM | POA: Diagnosis not present

## 2023-03-09 DIAGNOSIS — E039 Hypothyroidism, unspecified: Secondary | ICD-10-CM | POA: Diagnosis not present

## 2023-03-09 DIAGNOSIS — I10 Essential (primary) hypertension: Secondary | ICD-10-CM | POA: Diagnosis not present

## 2023-03-09 DIAGNOSIS — H5461 Unqualified visual loss, right eye, normal vision left eye: Secondary | ICD-10-CM | POA: Diagnosis not present

## 2023-03-09 DIAGNOSIS — K219 Gastro-esophageal reflux disease without esophagitis: Secondary | ICD-10-CM | POA: Diagnosis not present

## 2023-03-09 DIAGNOSIS — I4891 Unspecified atrial fibrillation: Secondary | ICD-10-CM | POA: Diagnosis not present

## 2023-03-10 DIAGNOSIS — I4891 Unspecified atrial fibrillation: Secondary | ICD-10-CM | POA: Diagnosis not present

## 2023-03-10 DIAGNOSIS — Z7902 Long term (current) use of antithrombotics/antiplatelets: Secondary | ICD-10-CM | POA: Diagnosis not present

## 2023-03-10 DIAGNOSIS — H5461 Unqualified visual loss, right eye, normal vision left eye: Secondary | ICD-10-CM | POA: Diagnosis not present

## 2023-03-10 DIAGNOSIS — K219 Gastro-esophageal reflux disease without esophagitis: Secondary | ICD-10-CM | POA: Diagnosis not present

## 2023-03-10 DIAGNOSIS — I10 Essential (primary) hypertension: Secondary | ICD-10-CM | POA: Diagnosis not present

## 2023-03-10 DIAGNOSIS — E039 Hypothyroidism, unspecified: Secondary | ICD-10-CM | POA: Diagnosis not present

## 2023-03-10 DIAGNOSIS — Z7901 Long term (current) use of anticoagulants: Secondary | ICD-10-CM | POA: Diagnosis not present

## 2023-03-10 DIAGNOSIS — I214 Non-ST elevation (NSTEMI) myocardial infarction: Secondary | ICD-10-CM | POA: Diagnosis not present

## 2023-03-13 DIAGNOSIS — K219 Gastro-esophageal reflux disease without esophagitis: Secondary | ICD-10-CM | POA: Diagnosis not present

## 2023-03-13 DIAGNOSIS — E039 Hypothyroidism, unspecified: Secondary | ICD-10-CM | POA: Diagnosis not present

## 2023-03-13 DIAGNOSIS — Z7901 Long term (current) use of anticoagulants: Secondary | ICD-10-CM | POA: Diagnosis not present

## 2023-03-13 DIAGNOSIS — I4891 Unspecified atrial fibrillation: Secondary | ICD-10-CM | POA: Diagnosis not present

## 2023-03-13 DIAGNOSIS — H5461 Unqualified visual loss, right eye, normal vision left eye: Secondary | ICD-10-CM | POA: Diagnosis not present

## 2023-03-13 DIAGNOSIS — I10 Essential (primary) hypertension: Secondary | ICD-10-CM | POA: Diagnosis not present

## 2023-03-13 DIAGNOSIS — Z7902 Long term (current) use of antithrombotics/antiplatelets: Secondary | ICD-10-CM | POA: Diagnosis not present

## 2023-03-13 DIAGNOSIS — I214 Non-ST elevation (NSTEMI) myocardial infarction: Secondary | ICD-10-CM | POA: Diagnosis not present

## 2023-03-14 DIAGNOSIS — E039 Hypothyroidism, unspecified: Secondary | ICD-10-CM | POA: Diagnosis not present

## 2023-03-14 DIAGNOSIS — I214 Non-ST elevation (NSTEMI) myocardial infarction: Secondary | ICD-10-CM | POA: Diagnosis not present

## 2023-03-14 DIAGNOSIS — I4891 Unspecified atrial fibrillation: Secondary | ICD-10-CM | POA: Diagnosis not present

## 2023-03-14 DIAGNOSIS — H5461 Unqualified visual loss, right eye, normal vision left eye: Secondary | ICD-10-CM | POA: Diagnosis not present

## 2023-03-14 DIAGNOSIS — Z7901 Long term (current) use of anticoagulants: Secondary | ICD-10-CM | POA: Diagnosis not present

## 2023-03-14 DIAGNOSIS — I10 Essential (primary) hypertension: Secondary | ICD-10-CM | POA: Diagnosis not present

## 2023-03-14 DIAGNOSIS — K219 Gastro-esophageal reflux disease without esophagitis: Secondary | ICD-10-CM | POA: Diagnosis not present

## 2023-03-14 DIAGNOSIS — Z7902 Long term (current) use of antithrombotics/antiplatelets: Secondary | ICD-10-CM | POA: Diagnosis not present

## 2023-03-15 DIAGNOSIS — Z7901 Long term (current) use of anticoagulants: Secondary | ICD-10-CM | POA: Diagnosis not present

## 2023-03-15 DIAGNOSIS — I4891 Unspecified atrial fibrillation: Secondary | ICD-10-CM | POA: Diagnosis not present

## 2023-03-15 DIAGNOSIS — H5461 Unqualified visual loss, right eye, normal vision left eye: Secondary | ICD-10-CM | POA: Diagnosis not present

## 2023-03-15 DIAGNOSIS — I10 Essential (primary) hypertension: Secondary | ICD-10-CM | POA: Diagnosis not present

## 2023-03-15 DIAGNOSIS — I214 Non-ST elevation (NSTEMI) myocardial infarction: Secondary | ICD-10-CM | POA: Diagnosis not present

## 2023-03-15 DIAGNOSIS — K219 Gastro-esophageal reflux disease without esophagitis: Secondary | ICD-10-CM | POA: Diagnosis not present

## 2023-03-15 DIAGNOSIS — E039 Hypothyroidism, unspecified: Secondary | ICD-10-CM | POA: Diagnosis not present

## 2023-03-15 DIAGNOSIS — Z7902 Long term (current) use of antithrombotics/antiplatelets: Secondary | ICD-10-CM | POA: Diagnosis not present

## 2023-03-16 DIAGNOSIS — I10 Essential (primary) hypertension: Secondary | ICD-10-CM | POA: Diagnosis not present

## 2023-03-16 DIAGNOSIS — H5461 Unqualified visual loss, right eye, normal vision left eye: Secondary | ICD-10-CM | POA: Diagnosis not present

## 2023-03-16 DIAGNOSIS — I4891 Unspecified atrial fibrillation: Secondary | ICD-10-CM | POA: Diagnosis not present

## 2023-03-16 DIAGNOSIS — I214 Non-ST elevation (NSTEMI) myocardial infarction: Secondary | ICD-10-CM | POA: Diagnosis not present

## 2023-03-22 DIAGNOSIS — Z7901 Long term (current) use of anticoagulants: Secondary | ICD-10-CM | POA: Diagnosis not present

## 2023-03-22 DIAGNOSIS — I10 Essential (primary) hypertension: Secondary | ICD-10-CM | POA: Diagnosis not present

## 2023-03-22 DIAGNOSIS — I214 Non-ST elevation (NSTEMI) myocardial infarction: Secondary | ICD-10-CM | POA: Diagnosis not present

## 2023-03-22 DIAGNOSIS — I4891 Unspecified atrial fibrillation: Secondary | ICD-10-CM | POA: Diagnosis not present

## 2023-03-22 DIAGNOSIS — H5461 Unqualified visual loss, right eye, normal vision left eye: Secondary | ICD-10-CM | POA: Diagnosis not present

## 2023-03-22 DIAGNOSIS — K219 Gastro-esophageal reflux disease without esophagitis: Secondary | ICD-10-CM | POA: Diagnosis not present

## 2023-03-22 DIAGNOSIS — E039 Hypothyroidism, unspecified: Secondary | ICD-10-CM | POA: Diagnosis not present

## 2023-03-22 DIAGNOSIS — Z7902 Long term (current) use of antithrombotics/antiplatelets: Secondary | ICD-10-CM | POA: Diagnosis not present

## 2023-03-24 DIAGNOSIS — I4891 Unspecified atrial fibrillation: Secondary | ICD-10-CM | POA: Diagnosis not present

## 2023-03-24 DIAGNOSIS — E039 Hypothyroidism, unspecified: Secondary | ICD-10-CM | POA: Diagnosis not present

## 2023-03-24 DIAGNOSIS — I214 Non-ST elevation (NSTEMI) myocardial infarction: Secondary | ICD-10-CM | POA: Diagnosis not present

## 2023-03-24 DIAGNOSIS — Z7902 Long term (current) use of antithrombotics/antiplatelets: Secondary | ICD-10-CM | POA: Diagnosis not present

## 2023-03-24 DIAGNOSIS — K219 Gastro-esophageal reflux disease without esophagitis: Secondary | ICD-10-CM | POA: Diagnosis not present

## 2023-03-24 DIAGNOSIS — I10 Essential (primary) hypertension: Secondary | ICD-10-CM | POA: Diagnosis not present

## 2023-03-24 DIAGNOSIS — Z7901 Long term (current) use of anticoagulants: Secondary | ICD-10-CM | POA: Diagnosis not present

## 2023-03-24 DIAGNOSIS — H5461 Unqualified visual loss, right eye, normal vision left eye: Secondary | ICD-10-CM | POA: Diagnosis not present

## 2023-03-26 DIAGNOSIS — E039 Hypothyroidism, unspecified: Secondary | ICD-10-CM | POA: Diagnosis not present

## 2023-03-26 DIAGNOSIS — I4891 Unspecified atrial fibrillation: Secondary | ICD-10-CM | POA: Diagnosis not present

## 2023-03-26 DIAGNOSIS — Z7902 Long term (current) use of antithrombotics/antiplatelets: Secondary | ICD-10-CM | POA: Diagnosis not present

## 2023-03-26 DIAGNOSIS — K219 Gastro-esophageal reflux disease without esophagitis: Secondary | ICD-10-CM | POA: Diagnosis not present

## 2023-03-26 DIAGNOSIS — Z7901 Long term (current) use of anticoagulants: Secondary | ICD-10-CM | POA: Diagnosis not present

## 2023-03-26 DIAGNOSIS — I10 Essential (primary) hypertension: Secondary | ICD-10-CM | POA: Diagnosis not present

## 2023-03-26 DIAGNOSIS — H5461 Unqualified visual loss, right eye, normal vision left eye: Secondary | ICD-10-CM | POA: Diagnosis not present

## 2023-03-26 DIAGNOSIS — I214 Non-ST elevation (NSTEMI) myocardial infarction: Secondary | ICD-10-CM | POA: Diagnosis not present

## 2023-03-27 DIAGNOSIS — I4891 Unspecified atrial fibrillation: Secondary | ICD-10-CM | POA: Diagnosis not present

## 2023-03-27 DIAGNOSIS — Z7902 Long term (current) use of antithrombotics/antiplatelets: Secondary | ICD-10-CM | POA: Diagnosis not present

## 2023-03-27 DIAGNOSIS — I214 Non-ST elevation (NSTEMI) myocardial infarction: Secondary | ICD-10-CM | POA: Diagnosis not present

## 2023-03-27 DIAGNOSIS — K219 Gastro-esophageal reflux disease without esophagitis: Secondary | ICD-10-CM | POA: Diagnosis not present

## 2023-03-27 DIAGNOSIS — E039 Hypothyroidism, unspecified: Secondary | ICD-10-CM | POA: Diagnosis not present

## 2023-03-27 DIAGNOSIS — I10 Essential (primary) hypertension: Secondary | ICD-10-CM | POA: Diagnosis not present

## 2023-03-27 DIAGNOSIS — H5461 Unqualified visual loss, right eye, normal vision left eye: Secondary | ICD-10-CM | POA: Diagnosis not present

## 2023-03-27 DIAGNOSIS — Z7901 Long term (current) use of anticoagulants: Secondary | ICD-10-CM | POA: Diagnosis not present

## 2023-03-30 DIAGNOSIS — Z7902 Long term (current) use of antithrombotics/antiplatelets: Secondary | ICD-10-CM | POA: Diagnosis not present

## 2023-03-30 DIAGNOSIS — E039 Hypothyroidism, unspecified: Secondary | ICD-10-CM | POA: Diagnosis not present

## 2023-03-30 DIAGNOSIS — K219 Gastro-esophageal reflux disease without esophagitis: Secondary | ICD-10-CM | POA: Diagnosis not present

## 2023-03-30 DIAGNOSIS — H5461 Unqualified visual loss, right eye, normal vision left eye: Secondary | ICD-10-CM | POA: Diagnosis not present

## 2023-03-30 DIAGNOSIS — I10 Essential (primary) hypertension: Secondary | ICD-10-CM | POA: Diagnosis not present

## 2023-03-30 DIAGNOSIS — I214 Non-ST elevation (NSTEMI) myocardial infarction: Secondary | ICD-10-CM | POA: Diagnosis not present

## 2023-03-30 DIAGNOSIS — I4891 Unspecified atrial fibrillation: Secondary | ICD-10-CM | POA: Diagnosis not present

## 2023-03-30 DIAGNOSIS — Z7901 Long term (current) use of anticoagulants: Secondary | ICD-10-CM | POA: Diagnosis not present

## 2023-04-01 DIAGNOSIS — I4891 Unspecified atrial fibrillation: Secondary | ICD-10-CM | POA: Diagnosis not present

## 2023-04-01 DIAGNOSIS — E039 Hypothyroidism, unspecified: Secondary | ICD-10-CM | POA: Diagnosis not present

## 2023-04-01 DIAGNOSIS — I214 Non-ST elevation (NSTEMI) myocardial infarction: Secondary | ICD-10-CM | POA: Diagnosis not present

## 2023-04-01 DIAGNOSIS — I10 Essential (primary) hypertension: Secondary | ICD-10-CM | POA: Diagnosis not present

## 2023-04-01 DIAGNOSIS — K219 Gastro-esophageal reflux disease without esophagitis: Secondary | ICD-10-CM | POA: Diagnosis not present

## 2023-04-01 DIAGNOSIS — Z7902 Long term (current) use of antithrombotics/antiplatelets: Secondary | ICD-10-CM | POA: Diagnosis not present

## 2023-04-01 DIAGNOSIS — Z7901 Long term (current) use of anticoagulants: Secondary | ICD-10-CM | POA: Diagnosis not present

## 2023-04-01 DIAGNOSIS — H5461 Unqualified visual loss, right eye, normal vision left eye: Secondary | ICD-10-CM | POA: Diagnosis not present

## 2023-04-05 NOTE — Progress Notes (Signed)
  Electrophysiology Office Note:   Date:  04/07/2023  ID:  [name], DOB [birth year], MRN [id number]  Primary Cardiologist: None Electrophysiologist: Lewayne Bunting, MD      History of Present Illness:   [name] is a 89 y.o. female with h/o CRAO, HTN, Neuropathy, PE/DVT, and CHB s/p PPM seen today for post hospital follow up.    Admitted to [facility] 01/2023 for chest pain that started at rest. HS trop peaked at 438 Echo 02/20/2023 LVEF 60-65%, mod MS, trace MR PE CTA 02/17/2023 negative DVT US (left)  4/30 negative  Since discharge from hospital the patient reports doing well. Continues to have some atypical chest discomfort. She has stopped the plavix as she felt It was causing abdominal pain. Drinking a lot of water every day.  she denies exertional chest pain, palpitations, dyspnea, PND, orthopnea, nausea, vomiting, dizziness, syncope, edema, weight gain, or early satiety.   Review of systems complete and found to be negative unless listed in HPI.   Device History: St. Jude Dual Chamber PPM implanted 01/11/21 for symptomatic bradycardia/AV block    Studies Reviewed:    PPM Interrogation-  reviewed in detail today,  See PACEART report.  EKG is ordered today. Personal review shows NSR at 75 bpm, RBBB   Physical Exam:   VS:  BP 108/66   Pulse 75   Wt 212 lb (96.2 kg)   SpO2 97%   BMI 33.20 kg/m    Wt Readings from Last 3 Encounters:  04/07/23 212 lb (96.2 kg)  11/30/22 210 lb (95.3 kg)  08/28/22 195 lb (88.5 kg)     GEN: Well nourished, well developed in no acute distress NECK: No JVD; No carotid bruits CARDIAC: Regular rate and rhythm, no murmurs, rubs, gallops RESPIRATORY:  Clear to auscultation without rales, wheezing or rhonchi  ABDOMEN: Soft, non-tender, non-distended EXTREMITIES:  No edema; No deformity   ASSESSMENT AND PLAN:    Advanced AV block s/p Abbott PPM  Normal PPM function See Pace Art report No changes today  Chronic diastolic heart  failure Volume status stable on exam NYHA III symptoms chronically   Encouraged fluid and salt restriction.   ?NSTEMI Chest pain, atypical  Has improved since admission, but still occurs on occasion Pt is very specific that is has only ever occurred at rest, and has not been related to exertion.  Sometimes related to food Low suspicion of cardiac involvement Suspect recent exacerbation was CHF.  With no clear history of CAD, and + h/o GI bleeds discussed with Dr. Ladona Ridgel and will stop Plavix at this time.  We will also add imdur 15 mg daily Continue BB, but will change to 12.5 of Toprol for ease of use.     Disposition:   Follow up with EP APP  in 2 months   Signed, Graciella Freer, PA-C

## 2023-04-06 DIAGNOSIS — Z7902 Long term (current) use of antithrombotics/antiplatelets: Secondary | ICD-10-CM | POA: Diagnosis not present

## 2023-04-06 DIAGNOSIS — I214 Non-ST elevation (NSTEMI) myocardial infarction: Secondary | ICD-10-CM | POA: Diagnosis not present

## 2023-04-06 DIAGNOSIS — E039 Hypothyroidism, unspecified: Secondary | ICD-10-CM | POA: Diagnosis not present

## 2023-04-06 DIAGNOSIS — I4891 Unspecified atrial fibrillation: Secondary | ICD-10-CM | POA: Diagnosis not present

## 2023-04-06 DIAGNOSIS — Z7901 Long term (current) use of anticoagulants: Secondary | ICD-10-CM | POA: Diagnosis not present

## 2023-04-06 DIAGNOSIS — H5461 Unqualified visual loss, right eye, normal vision left eye: Secondary | ICD-10-CM | POA: Diagnosis not present

## 2023-04-06 DIAGNOSIS — K219 Gastro-esophageal reflux disease without esophagitis: Secondary | ICD-10-CM | POA: Diagnosis not present

## 2023-04-06 DIAGNOSIS — I10 Essential (primary) hypertension: Secondary | ICD-10-CM | POA: Diagnosis not present

## 2023-04-07 ENCOUNTER — Ambulatory Visit: Payer: Medicare Other | Attending: Student | Admitting: Student

## 2023-04-07 ENCOUNTER — Encounter: Payer: Self-pay | Admitting: Student

## 2023-04-07 VITALS — BP 108/66 | HR 75 | Wt 212.0 lb

## 2023-04-07 DIAGNOSIS — R0789 Other chest pain: Secondary | ICD-10-CM

## 2023-04-07 DIAGNOSIS — I10 Essential (primary) hypertension: Secondary | ICD-10-CM | POA: Diagnosis not present

## 2023-04-07 DIAGNOSIS — I4430 Unspecified atrioventricular block: Secondary | ICD-10-CM

## 2023-04-07 DIAGNOSIS — Z950 Presence of cardiac pacemaker: Secondary | ICD-10-CM | POA: Diagnosis not present

## 2023-04-07 LAB — CUP PACEART INCLINIC DEVICE CHECK
Date Time Interrogation Session: 20240614093443
Implantable Lead Connection Status: 753985
Implantable Lead Connection Status: 753985
Implantable Lead Implant Date: 20220321
Implantable Lead Implant Date: 20220321
Implantable Lead Location: 753859
Implantable Lead Location: 753860
Implantable Pulse Generator Implant Date: 20220321
Pulse Gen Model: 2272
Pulse Gen Serial Number: 3910619

## 2023-04-07 MED ORDER — ISOSORBIDE MONONITRATE ER 30 MG PO TB24
15.0000 mg | ORAL_TABLET | Freq: Every day | ORAL | 3 refills | Status: AC
Start: 2023-04-07 — End: ?

## 2023-04-07 MED ORDER — METOPROLOL SUCCINATE ER 25 MG PO TB24
12.5000 mg | ORAL_TABLET | Freq: Every day | ORAL | 3 refills | Status: AC
Start: 2023-04-07 — End: ?

## 2023-04-07 NOTE — Patient Instructions (Addendum)
Medication Instructions:  1.Stop clopidogrel (Plavix) 2.Stop metoprolol tartrate (Lopressor) 3.Start metoprol succinate (Toprol XL) 12.5 mg daily (this will be 1/2 of the 25 mg tablet) 4.Start isosorbide mononitrate (Imdur) 15 mg daily (this will be 1/2 of the 30 mg tablet) *If you need a refill on your cardiac medications before your next appointment, please call your pharmacy*  Lab Work: BMET, BNP, CBC-TODAY If you have labs (blood work) drawn today and your tests are completely normal, you will receive your results only by: MyChart Message (if you have MyChart) OR A paper copy in the mail If you have any lab test that is abnormal or we need to change your treatment, we will call you to review the results.  Follow-Up: At [facility], you and your health needs are our priority.  As part of our continuing mission to provide you with exceptional heart care, we have created designated Provider Care Teams.  These Care Teams include your primary Cardiologist (physician) and Advanced Practice Providers (APPs -  Physician Assistants and Nurse Practitioners) who all work together to provide you with the care you need, when you need it.  Your next appointment:   2 month(s)  Provider:   Casimiro Needle "Otilio Saber, PA-C

## 2023-04-08 LAB — BASIC METABOLIC PANEL
BUN/Creatinine Ratio: 15 (ref 12–28)
BUN: 15 mg/dL (ref 10–36)
CO2: 24 mmol/L (ref 20–29)
Calcium: 9.2 mg/dL (ref 8.7–10.3)
Chloride: 107 mmol/L — ABNORMAL HIGH (ref 96–106)
Creatinine, Ser: 0.98 mg/dL (ref 0.57–1.00)
Glucose: 112 mg/dL — ABNORMAL HIGH (ref 70–99)
Potassium: 4.3 mmol/L (ref 3.5–5.2)
Sodium: 143 mmol/L (ref 134–144)
eGFR: 53 mL/min/{1.73_m2} — ABNORMAL LOW (ref >59)

## 2023-04-08 LAB — CBC
Hematocrit: 38.2 % (ref 34.0–46.6)
Hemoglobin: 12.3 g/dL (ref 11.1–15.9)
MCH: 27.7 pg (ref 26.6–33.0)
MCHC: 32.2 g/dL (ref 31.5–35.7)
MCV: 86 fL (ref 79–97)
Platelets: 171 10*3/uL (ref 150–450)
RBC: 4.44 x10E6/uL (ref 3.77–5.28)
RDW: 14.1 % (ref 11.7–15.4)
WBC: 3.9 10*3/uL (ref 3.4–10.8)

## 2023-04-08 LAB — PRO B NATRIURETIC PEPTIDE: NT-Pro BNP: 161 pg/mL (ref 0–738)

## 2023-04-10 ENCOUNTER — Ambulatory Visit (INDEPENDENT_AMBULATORY_CARE_PROVIDER_SITE_OTHER): Payer: Medicare Other

## 2023-04-10 DIAGNOSIS — I4430 Unspecified atrioventricular block: Secondary | ICD-10-CM | POA: Diagnosis not present

## 2023-04-10 LAB — CUP PACEART REMOTE DEVICE CHECK
Battery Remaining Longevity: 108 mo
Battery Remaining Percentage: 85 %
Battery Voltage: 3.02 V
Brady Statistic AP VP Percent: 1 %
Brady Statistic AP VS Percent: 6.8 %
Brady Statistic AS VP Percent: 1 %
Brady Statistic AS VS Percent: 93 %
Brady Statistic RA Percent Paced: 6.3 %
Brady Statistic RV Percent Paced: 1 %
Date Time Interrogation Session: 20240617020013
Implantable Lead Connection Status: 753985
Implantable Lead Connection Status: 753985
Implantable Lead Implant Date: 20220321
Implantable Lead Implant Date: 20220321
Implantable Lead Location: 753859
Implantable Lead Location: 753860
Implantable Pulse Generator Implant Date: 20220321
Lead Channel Impedance Value: 450 Ohm
Lead Channel Impedance Value: 540 Ohm
Lead Channel Pacing Threshold Amplitude: 0.75 V
Lead Channel Pacing Threshold Amplitude: 0.75 V
Lead Channel Pacing Threshold Pulse Width: 0.4 ms
Lead Channel Pacing Threshold Pulse Width: 0.4 ms
Lead Channel Sensing Intrinsic Amplitude: 12 mV
Lead Channel Sensing Intrinsic Amplitude: 2.5 mV
Lead Channel Setting Pacing Amplitude: 2 V
Lead Channel Setting Pacing Amplitude: 2.5 V
Lead Channel Setting Pacing Pulse Width: 0.4 ms
Lead Channel Setting Sensing Sensitivity: 2 mV
Pulse Gen Model: 2272
Pulse Gen Serial Number: 3910619

## 2023-04-11 DIAGNOSIS — Z7902 Long term (current) use of antithrombotics/antiplatelets: Secondary | ICD-10-CM | POA: Diagnosis not present

## 2023-04-11 DIAGNOSIS — I4891 Unspecified atrial fibrillation: Secondary | ICD-10-CM | POA: Diagnosis not present

## 2023-04-11 DIAGNOSIS — E039 Hypothyroidism, unspecified: Secondary | ICD-10-CM | POA: Diagnosis not present

## 2023-04-11 DIAGNOSIS — I10 Essential (primary) hypertension: Secondary | ICD-10-CM | POA: Diagnosis not present

## 2023-04-11 DIAGNOSIS — H5461 Unqualified visual loss, right eye, normal vision left eye: Secondary | ICD-10-CM | POA: Diagnosis not present

## 2023-04-11 DIAGNOSIS — K219 Gastro-esophageal reflux disease without esophagitis: Secondary | ICD-10-CM | POA: Diagnosis not present

## 2023-04-11 DIAGNOSIS — Z7901 Long term (current) use of anticoagulants: Secondary | ICD-10-CM | POA: Diagnosis not present

## 2023-04-11 DIAGNOSIS — I214 Non-ST elevation (NSTEMI) myocardial infarction: Secondary | ICD-10-CM | POA: Diagnosis not present

## 2023-04-13 DIAGNOSIS — Z7901 Long term (current) use of anticoagulants: Secondary | ICD-10-CM | POA: Diagnosis not present

## 2023-04-13 DIAGNOSIS — Z7902 Long term (current) use of antithrombotics/antiplatelets: Secondary | ICD-10-CM | POA: Diagnosis not present

## 2023-04-13 DIAGNOSIS — K219 Gastro-esophageal reflux disease without esophagitis: Secondary | ICD-10-CM | POA: Diagnosis not present

## 2023-04-13 DIAGNOSIS — I214 Non-ST elevation (NSTEMI) myocardial infarction: Secondary | ICD-10-CM | POA: Diagnosis not present

## 2023-04-13 DIAGNOSIS — E039 Hypothyroidism, unspecified: Secondary | ICD-10-CM | POA: Diagnosis not present

## 2023-04-13 DIAGNOSIS — H5461 Unqualified visual loss, right eye, normal vision left eye: Secondary | ICD-10-CM | POA: Diagnosis not present

## 2023-04-13 DIAGNOSIS — I4891 Unspecified atrial fibrillation: Secondary | ICD-10-CM | POA: Diagnosis not present

## 2023-04-13 DIAGNOSIS — I10 Essential (primary) hypertension: Secondary | ICD-10-CM | POA: Diagnosis not present

## 2023-04-14 DIAGNOSIS — E039 Hypothyroidism, unspecified: Secondary | ICD-10-CM | POA: Diagnosis not present

## 2023-04-14 DIAGNOSIS — I4891 Unspecified atrial fibrillation: Secondary | ICD-10-CM | POA: Diagnosis not present

## 2023-04-14 DIAGNOSIS — I214 Non-ST elevation (NSTEMI) myocardial infarction: Secondary | ICD-10-CM | POA: Diagnosis not present

## 2023-04-14 DIAGNOSIS — I10 Essential (primary) hypertension: Secondary | ICD-10-CM | POA: Diagnosis not present

## 2023-04-14 DIAGNOSIS — Z7902 Long term (current) use of antithrombotics/antiplatelets: Secondary | ICD-10-CM | POA: Diagnosis not present

## 2023-04-14 DIAGNOSIS — H5461 Unqualified visual loss, right eye, normal vision left eye: Secondary | ICD-10-CM | POA: Diagnosis not present

## 2023-04-14 DIAGNOSIS — K219 Gastro-esophageal reflux disease without esophagitis: Secondary | ICD-10-CM | POA: Diagnosis not present

## 2023-04-14 DIAGNOSIS — Z7901 Long term (current) use of anticoagulants: Secondary | ICD-10-CM | POA: Diagnosis not present

## 2023-04-19 DIAGNOSIS — I10 Essential (primary) hypertension: Secondary | ICD-10-CM | POA: Diagnosis not present

## 2023-04-19 DIAGNOSIS — Z7902 Long term (current) use of antithrombotics/antiplatelets: Secondary | ICD-10-CM | POA: Diagnosis not present

## 2023-04-19 DIAGNOSIS — K219 Gastro-esophageal reflux disease without esophagitis: Secondary | ICD-10-CM | POA: Diagnosis not present

## 2023-04-19 DIAGNOSIS — I4891 Unspecified atrial fibrillation: Secondary | ICD-10-CM | POA: Diagnosis not present

## 2023-04-19 DIAGNOSIS — H5461 Unqualified visual loss, right eye, normal vision left eye: Secondary | ICD-10-CM | POA: Diagnosis not present

## 2023-04-19 DIAGNOSIS — I214 Non-ST elevation (NSTEMI) myocardial infarction: Secondary | ICD-10-CM | POA: Diagnosis not present

## 2023-04-19 DIAGNOSIS — Z7901 Long term (current) use of anticoagulants: Secondary | ICD-10-CM | POA: Diagnosis not present

## 2023-04-19 DIAGNOSIS — E039 Hypothyroidism, unspecified: Secondary | ICD-10-CM | POA: Diagnosis not present

## 2023-04-20 DIAGNOSIS — Z7901 Long term (current) use of anticoagulants: Secondary | ICD-10-CM | POA: Diagnosis not present

## 2023-04-20 DIAGNOSIS — I214 Non-ST elevation (NSTEMI) myocardial infarction: Secondary | ICD-10-CM | POA: Diagnosis not present

## 2023-04-20 DIAGNOSIS — H5461 Unqualified visual loss, right eye, normal vision left eye: Secondary | ICD-10-CM | POA: Diagnosis not present

## 2023-04-20 DIAGNOSIS — I10 Essential (primary) hypertension: Secondary | ICD-10-CM | POA: Diagnosis not present

## 2023-04-20 DIAGNOSIS — E039 Hypothyroidism, unspecified: Secondary | ICD-10-CM | POA: Diagnosis not present

## 2023-04-20 DIAGNOSIS — Z7902 Long term (current) use of antithrombotics/antiplatelets: Secondary | ICD-10-CM | POA: Diagnosis not present

## 2023-04-20 DIAGNOSIS — K219 Gastro-esophageal reflux disease without esophagitis: Secondary | ICD-10-CM | POA: Diagnosis not present

## 2023-04-20 DIAGNOSIS — I4891 Unspecified atrial fibrillation: Secondary | ICD-10-CM | POA: Diagnosis not present

## 2023-04-25 DIAGNOSIS — Z7901 Long term (current) use of anticoagulants: Secondary | ICD-10-CM | POA: Diagnosis not present

## 2023-04-25 DIAGNOSIS — Z7902 Long term (current) use of antithrombotics/antiplatelets: Secondary | ICD-10-CM | POA: Diagnosis not present

## 2023-04-25 DIAGNOSIS — H5461 Unqualified visual loss, right eye, normal vision left eye: Secondary | ICD-10-CM | POA: Diagnosis not present

## 2023-04-25 DIAGNOSIS — I10 Essential (primary) hypertension: Secondary | ICD-10-CM | POA: Diagnosis not present

## 2023-04-25 DIAGNOSIS — E039 Hypothyroidism, unspecified: Secondary | ICD-10-CM | POA: Diagnosis not present

## 2023-04-25 DIAGNOSIS — I252 Old myocardial infarction: Secondary | ICD-10-CM | POA: Diagnosis not present

## 2023-04-25 DIAGNOSIS — I4891 Unspecified atrial fibrillation: Secondary | ICD-10-CM | POA: Diagnosis not present

## 2023-04-25 DIAGNOSIS — K219 Gastro-esophageal reflux disease without esophagitis: Secondary | ICD-10-CM | POA: Diagnosis not present

## 2023-04-28 NOTE — Progress Notes (Signed)
Remote pacemaker transmission.   

## 2023-05-01 DIAGNOSIS — Z7901 Long term (current) use of anticoagulants: Secondary | ICD-10-CM | POA: Diagnosis not present

## 2023-05-01 DIAGNOSIS — H5461 Unqualified visual loss, right eye, normal vision left eye: Secondary | ICD-10-CM | POA: Diagnosis not present

## 2023-05-01 DIAGNOSIS — E039 Hypothyroidism, unspecified: Secondary | ICD-10-CM | POA: Diagnosis not present

## 2023-05-01 DIAGNOSIS — I10 Essential (primary) hypertension: Secondary | ICD-10-CM | POA: Diagnosis not present

## 2023-05-01 DIAGNOSIS — K219 Gastro-esophageal reflux disease without esophagitis: Secondary | ICD-10-CM | POA: Diagnosis not present

## 2023-05-01 DIAGNOSIS — Z7902 Long term (current) use of antithrombotics/antiplatelets: Secondary | ICD-10-CM | POA: Diagnosis not present

## 2023-05-01 DIAGNOSIS — I252 Old myocardial infarction: Secondary | ICD-10-CM | POA: Diagnosis not present

## 2023-05-01 DIAGNOSIS — I4891 Unspecified atrial fibrillation: Secondary | ICD-10-CM | POA: Diagnosis not present

## 2023-05-09 DIAGNOSIS — H5461 Unqualified visual loss, right eye, normal vision left eye: Secondary | ICD-10-CM | POA: Diagnosis not present

## 2023-05-09 DIAGNOSIS — E039 Hypothyroidism, unspecified: Secondary | ICD-10-CM | POA: Diagnosis not present

## 2023-05-09 DIAGNOSIS — I10 Essential (primary) hypertension: Secondary | ICD-10-CM | POA: Diagnosis not present

## 2023-05-09 DIAGNOSIS — I4891 Unspecified atrial fibrillation: Secondary | ICD-10-CM | POA: Diagnosis not present

## 2023-05-10 DIAGNOSIS — I4891 Unspecified atrial fibrillation: Secondary | ICD-10-CM | POA: Diagnosis not present

## 2023-05-10 DIAGNOSIS — Z7901 Long term (current) use of anticoagulants: Secondary | ICD-10-CM | POA: Diagnosis not present

## 2023-05-10 DIAGNOSIS — I10 Essential (primary) hypertension: Secondary | ICD-10-CM | POA: Diagnosis not present

## 2023-05-10 DIAGNOSIS — E039 Hypothyroidism, unspecified: Secondary | ICD-10-CM | POA: Diagnosis not present

## 2023-05-10 DIAGNOSIS — Z7902 Long term (current) use of antithrombotics/antiplatelets: Secondary | ICD-10-CM | POA: Diagnosis not present

## 2023-05-10 DIAGNOSIS — I252 Old myocardial infarction: Secondary | ICD-10-CM | POA: Diagnosis not present

## 2023-05-10 DIAGNOSIS — K219 Gastro-esophageal reflux disease without esophagitis: Secondary | ICD-10-CM | POA: Diagnosis not present

## 2023-05-10 DIAGNOSIS — H5461 Unqualified visual loss, right eye, normal vision left eye: Secondary | ICD-10-CM | POA: Diagnosis not present

## 2023-05-17 DIAGNOSIS — Z7901 Long term (current) use of anticoagulants: Secondary | ICD-10-CM | POA: Diagnosis not present

## 2023-05-17 DIAGNOSIS — I252 Old myocardial infarction: Secondary | ICD-10-CM | POA: Diagnosis not present

## 2023-05-17 DIAGNOSIS — E039 Hypothyroidism, unspecified: Secondary | ICD-10-CM | POA: Diagnosis not present

## 2023-05-17 DIAGNOSIS — I4891 Unspecified atrial fibrillation: Secondary | ICD-10-CM | POA: Diagnosis not present

## 2023-05-17 DIAGNOSIS — H5461 Unqualified visual loss, right eye, normal vision left eye: Secondary | ICD-10-CM | POA: Diagnosis not present

## 2023-05-17 DIAGNOSIS — K219 Gastro-esophageal reflux disease without esophagitis: Secondary | ICD-10-CM | POA: Diagnosis not present

## 2023-05-17 DIAGNOSIS — I10 Essential (primary) hypertension: Secondary | ICD-10-CM | POA: Diagnosis not present

## 2023-05-17 DIAGNOSIS — Z7902 Long term (current) use of antithrombotics/antiplatelets: Secondary | ICD-10-CM | POA: Diagnosis not present

## 2023-05-22 DIAGNOSIS — E039 Hypothyroidism, unspecified: Secondary | ICD-10-CM | POA: Diagnosis not present

## 2023-05-22 DIAGNOSIS — Z7902 Long term (current) use of antithrombotics/antiplatelets: Secondary | ICD-10-CM | POA: Diagnosis not present

## 2023-05-22 DIAGNOSIS — H5461 Unqualified visual loss, right eye, normal vision left eye: Secondary | ICD-10-CM | POA: Diagnosis not present

## 2023-05-22 DIAGNOSIS — I252 Old myocardial infarction: Secondary | ICD-10-CM | POA: Diagnosis not present

## 2023-05-22 DIAGNOSIS — Z7901 Long term (current) use of anticoagulants: Secondary | ICD-10-CM | POA: Diagnosis not present

## 2023-05-22 DIAGNOSIS — I10 Essential (primary) hypertension: Secondary | ICD-10-CM | POA: Diagnosis not present

## 2023-05-22 DIAGNOSIS — K219 Gastro-esophageal reflux disease without esophagitis: Secondary | ICD-10-CM | POA: Diagnosis not present

## 2023-05-22 DIAGNOSIS — I4891 Unspecified atrial fibrillation: Secondary | ICD-10-CM | POA: Diagnosis not present

## 2023-05-25 DIAGNOSIS — H5461 Unqualified visual loss, right eye, normal vision left eye: Secondary | ICD-10-CM | POA: Diagnosis not present

## 2023-05-25 DIAGNOSIS — K219 Gastro-esophageal reflux disease without esophagitis: Secondary | ICD-10-CM | POA: Diagnosis not present

## 2023-05-25 DIAGNOSIS — I252 Old myocardial infarction: Secondary | ICD-10-CM | POA: Diagnosis not present

## 2023-05-25 DIAGNOSIS — I10 Essential (primary) hypertension: Secondary | ICD-10-CM | POA: Diagnosis not present

## 2023-05-25 DIAGNOSIS — E039 Hypothyroidism, unspecified: Secondary | ICD-10-CM | POA: Diagnosis not present

## 2023-05-25 DIAGNOSIS — Z7902 Long term (current) use of antithrombotics/antiplatelets: Secondary | ICD-10-CM | POA: Diagnosis not present

## 2023-05-25 DIAGNOSIS — I4891 Unspecified atrial fibrillation: Secondary | ICD-10-CM | POA: Diagnosis not present

## 2023-05-25 DIAGNOSIS — Z7901 Long term (current) use of anticoagulants: Secondary | ICD-10-CM | POA: Diagnosis not present

## 2023-05-29 DIAGNOSIS — I10 Essential (primary) hypertension: Secondary | ICD-10-CM | POA: Diagnosis not present

## 2023-05-29 DIAGNOSIS — Z7902 Long term (current) use of antithrombotics/antiplatelets: Secondary | ICD-10-CM | POA: Diagnosis not present

## 2023-05-29 DIAGNOSIS — I252 Old myocardial infarction: Secondary | ICD-10-CM | POA: Diagnosis not present

## 2023-05-29 DIAGNOSIS — Z7901 Long term (current) use of anticoagulants: Secondary | ICD-10-CM | POA: Diagnosis not present

## 2023-05-29 DIAGNOSIS — K219 Gastro-esophageal reflux disease without esophagitis: Secondary | ICD-10-CM | POA: Diagnosis not present

## 2023-05-29 DIAGNOSIS — H5461 Unqualified visual loss, right eye, normal vision left eye: Secondary | ICD-10-CM | POA: Diagnosis not present

## 2023-05-29 DIAGNOSIS — I4891 Unspecified atrial fibrillation: Secondary | ICD-10-CM | POA: Diagnosis not present

## 2023-05-29 DIAGNOSIS — E039 Hypothyroidism, unspecified: Secondary | ICD-10-CM | POA: Diagnosis not present

## 2023-06-05 DIAGNOSIS — H04123 Dry eye syndrome of bilateral lacrimal glands: Secondary | ICD-10-CM | POA: Diagnosis not present

## 2023-06-05 DIAGNOSIS — Z961 Presence of intraocular lens: Secondary | ICD-10-CM | POA: Diagnosis not present

## 2023-06-07 DIAGNOSIS — I10 Essential (primary) hypertension: Secondary | ICD-10-CM | POA: Diagnosis not present

## 2023-06-07 DIAGNOSIS — E039 Hypothyroidism, unspecified: Secondary | ICD-10-CM | POA: Diagnosis not present

## 2023-06-07 DIAGNOSIS — K219 Gastro-esophageal reflux disease without esophagitis: Secondary | ICD-10-CM | POA: Diagnosis not present

## 2023-06-07 DIAGNOSIS — Z7902 Long term (current) use of antithrombotics/antiplatelets: Secondary | ICD-10-CM | POA: Diagnosis not present

## 2023-06-07 DIAGNOSIS — I4891 Unspecified atrial fibrillation: Secondary | ICD-10-CM | POA: Diagnosis not present

## 2023-06-07 DIAGNOSIS — H5461 Unqualified visual loss, right eye, normal vision left eye: Secondary | ICD-10-CM | POA: Diagnosis not present

## 2023-06-07 DIAGNOSIS — I252 Old myocardial infarction: Secondary | ICD-10-CM | POA: Diagnosis not present

## 2023-06-07 DIAGNOSIS — Z7901 Long term (current) use of anticoagulants: Secondary | ICD-10-CM | POA: Diagnosis not present

## 2023-06-12 NOTE — Progress Notes (Unsigned)
  Electrophysiology Office Note:   ID:  [name], DOB Jul 02, 1927, MRN [id number]  Primary Cardiologist: None Electrophysiologist: Lewayne Bunting, MD  {Click to update primary MD,subspecialty MD or APP then REFRESH:1}    History of Present Illness:   [name] is a 89 y.o. female with h/o CRAO, HTN, Neuropathy, PE/DVT, and CHB s/p PPM seen today for routine electrophysiology followup.   Seen 04/07/2023 after [facility] with [facility].   Since last being seen in our clinic the patient reports doing ***.  she denies chest pain, palpitations, dyspnea, PND, orthopnea, nausea, vomiting, dizziness, syncope, edema, weight gain, or early satiety.   Review of systems complete and found to be negative unless listed in HPI.      EP Information / Studies Reviewed:    {EKGtoday:28818}       PPM Interrogation-  Not checked today. Stable 2 months ago.   Device History: St. Jude Dual Chamber PPM implanted 01/11/21 for symptomatic bradycardia/AV block   Echo 02/20/2023 LVEF 60-65%, mod MS, trace MR PE CTA 02/17/2023 negative DVT US (left)  4/30 negative  Physical Exam:   VS:  There were no vitals taken for this visit.   Wt Readings from Last 3 Encounters:  04/07/23 212 lb (96.2 kg)  11/30/22 210 lb (95.3 kg)  08/28/22 195 lb (88.5 kg)     GEN: Well nourished, well developed in no acute distress NECK: No JVD; No carotid bruits CARDIAC: {EPRHYTHM:28826}, no murmurs, rubs, gallops RESPIRATORY:  Clear to auscultation without rales, wheezing or rhonchi  ABDOMEN: Soft, non-tender, non-distended EXTREMITIES:  No edema; No deformity   ASSESSMENT AND PLAN:    Advanced AV block s/p Abbott PPM  Normal PPM function See [facility] Miss Art report from last visit.  No changes today  Chronic diastolic CHF Volume status *** NYHA III symptoms chronically Encouraged fluid and salt restriction  Atypical chest pain ***  {Click here to Review PMH, Prob List, Meds, Allergies, SHx, FHx  :1}    Disposition:   Follow up with {EPPROVIDERS:28135} {EPFOLLOW UP:28173}  Signed, Graciella Freer, PA-C

## 2023-06-13 ENCOUNTER — Encounter: Payer: Self-pay | Admitting: Student

## 2023-06-13 ENCOUNTER — Ambulatory Visit: Payer: Medicare Other | Attending: Student | Admitting: Student

## 2023-06-13 VITALS — BP 114/68 | HR 75 | Ht 67.0 in | Wt 199.0 lb

## 2023-06-13 DIAGNOSIS — R0789 Other chest pain: Secondary | ICD-10-CM

## 2023-06-13 DIAGNOSIS — I10 Essential (primary) hypertension: Secondary | ICD-10-CM

## 2023-06-13 DIAGNOSIS — Z950 Presence of cardiac pacemaker: Secondary | ICD-10-CM

## 2023-06-13 DIAGNOSIS — I4430 Unspecified atrioventricular block: Secondary | ICD-10-CM | POA: Diagnosis not present

## 2023-06-13 NOTE — Patient Instructions (Signed)
Medication Instructions:  Your physician recommends that you continue on your current medications as directed. Please refer to the Current Medication list given to you today.  *If you need a refill on your cardiac medications before your next appointment, please call your pharmacy*  Lab Work: None ordered If you have labs (blood work) drawn today and your tests are completely normal, you will receive your results only by: MyChart Message (if you have MyChart) OR A paper copy in the mail If you have any lab test that is abnormal or we need to change your treatment, we will call you to review the results  Follow-Up: At [facility], you and your health needs are our priority.  As part of our continuing mission to provide you with exceptional heart care, we have created designated Provider Care Teams.  These Care Teams include your primary Cardiologist (physician) and Advanced Practice Providers (APPs -  Physician Assistants and Nurse Practitioners) who all work together to provide you with the care you need, when you need it.  Your next appointment:   6 month(s)  Provider:   Michael "Andy" Tillery, PA-C  

## 2023-06-15 DIAGNOSIS — E039 Hypothyroidism, unspecified: Secondary | ICD-10-CM | POA: Diagnosis not present

## 2023-06-15 DIAGNOSIS — I4891 Unspecified atrial fibrillation: Secondary | ICD-10-CM | POA: Diagnosis not present

## 2023-06-15 DIAGNOSIS — K219 Gastro-esophageal reflux disease without esophagitis: Secondary | ICD-10-CM | POA: Diagnosis not present

## 2023-06-15 DIAGNOSIS — H5461 Unqualified visual loss, right eye, normal vision left eye: Secondary | ICD-10-CM | POA: Diagnosis not present

## 2023-06-15 DIAGNOSIS — I10 Essential (primary) hypertension: Secondary | ICD-10-CM | POA: Diagnosis not present

## 2023-06-15 DIAGNOSIS — Z7902 Long term (current) use of antithrombotics/antiplatelets: Secondary | ICD-10-CM | POA: Diagnosis not present

## 2023-06-15 DIAGNOSIS — Z7901 Long term (current) use of anticoagulants: Secondary | ICD-10-CM | POA: Diagnosis not present

## 2023-06-15 DIAGNOSIS — I252 Old myocardial infarction: Secondary | ICD-10-CM | POA: Diagnosis not present

## 2023-06-19 DIAGNOSIS — I10 Essential (primary) hypertension: Secondary | ICD-10-CM | POA: Diagnosis not present

## 2023-06-19 DIAGNOSIS — Z7902 Long term (current) use of antithrombotics/antiplatelets: Secondary | ICD-10-CM | POA: Diagnosis not present

## 2023-06-19 DIAGNOSIS — K219 Gastro-esophageal reflux disease without esophagitis: Secondary | ICD-10-CM | POA: Diagnosis not present

## 2023-06-19 DIAGNOSIS — I252 Old myocardial infarction: Secondary | ICD-10-CM | POA: Diagnosis not present

## 2023-06-19 DIAGNOSIS — I4891 Unspecified atrial fibrillation: Secondary | ICD-10-CM | POA: Diagnosis not present

## 2023-06-19 DIAGNOSIS — H5461 Unqualified visual loss, right eye, normal vision left eye: Secondary | ICD-10-CM | POA: Diagnosis not present

## 2023-06-19 DIAGNOSIS — Z7901 Long term (current) use of anticoagulants: Secondary | ICD-10-CM | POA: Diagnosis not present

## 2023-06-19 DIAGNOSIS — E039 Hypothyroidism, unspecified: Secondary | ICD-10-CM | POA: Diagnosis not present

## 2023-07-10 ENCOUNTER — Ambulatory Visit (INDEPENDENT_AMBULATORY_CARE_PROVIDER_SITE_OTHER): Payer: Medicare Other

## 2023-07-10 DIAGNOSIS — I4430 Unspecified atrioventricular block: Secondary | ICD-10-CM | POA: Diagnosis not present

## 2023-07-10 LAB — CUP PACEART REMOTE DEVICE CHECK
Battery Remaining Longevity: 106 mo
Battery Remaining Percentage: 83 %
Battery Voltage: 3.02 V
Brady Statistic AP VP Percent: 1 %
Brady Statistic AP VS Percent: 5.6 %
Brady Statistic AS VP Percent: 1 %
Brady Statistic AS VS Percent: 94 %
Brady Statistic RA Percent Paced: 5.2 %
Brady Statistic RV Percent Paced: 1 %
Date Time Interrogation Session: 20240916020015
Implantable Lead Connection Status: 753985
Implantable Lead Connection Status: 753985
Implantable Lead Implant Date: 20220321
Implantable Lead Implant Date: 20220321
Implantable Lead Location: 753859
Implantable Lead Location: 753860
Implantable Pulse Generator Implant Date: 20220321
Lead Channel Impedance Value: 450 Ohm
Lead Channel Impedance Value: 550 Ohm
Lead Channel Pacing Threshold Amplitude: 0.75 V
Lead Channel Pacing Threshold Amplitude: 0.75 V
Lead Channel Pacing Threshold Pulse Width: 0.4 ms
Lead Channel Pacing Threshold Pulse Width: 0.4 ms
Lead Channel Sensing Intrinsic Amplitude: 12 mV
Lead Channel Sensing Intrinsic Amplitude: 3.9 mV
Lead Channel Setting Pacing Amplitude: 2 V
Lead Channel Setting Pacing Amplitude: 2.5 V
Lead Channel Setting Pacing Pulse Width: 0.4 ms
Lead Channel Setting Sensing Sensitivity: 2 mV
Pulse Gen Model: 2272
Pulse Gen Serial Number: 3910619

## 2023-07-24 NOTE — Progress Notes (Signed)
Remote pacemaker transmission.   

## 2023-07-25 DIAGNOSIS — Z79899 Other long term (current) drug therapy: Secondary | ICD-10-CM | POA: Diagnosis not present

## 2023-07-25 DIAGNOSIS — E039 Hypothyroidism, unspecified: Secondary | ICD-10-CM | POA: Diagnosis not present

## 2023-07-25 DIAGNOSIS — K5900 Constipation, unspecified: Secondary | ICD-10-CM | POA: Diagnosis not present

## 2023-07-25 DIAGNOSIS — K219 Gastro-esophageal reflux disease without esophagitis: Secondary | ICD-10-CM | POA: Diagnosis not present

## 2023-07-26 DIAGNOSIS — I252 Old myocardial infarction: Secondary | ICD-10-CM | POA: Diagnosis not present

## 2023-07-26 DIAGNOSIS — F320 Major depressive disorder, single episode, mild: Secondary | ICD-10-CM | POA: Diagnosis not present

## 2023-10-09 ENCOUNTER — Ambulatory Visit (INDEPENDENT_AMBULATORY_CARE_PROVIDER_SITE_OTHER): Payer: Medicare Other

## 2023-10-09 DIAGNOSIS — I4430 Unspecified atrioventricular block: Secondary | ICD-10-CM

## 2023-10-09 LAB — CUP PACEART REMOTE DEVICE CHECK
Battery Remaining Longevity: 104 mo
Battery Remaining Percentage: 81 %
Battery Voltage: 3.02 V
Brady Statistic AP VP Percent: 1 %
Brady Statistic AP VS Percent: 4.7 %
Brady Statistic AS VP Percent: 1 %
Brady Statistic AS VS Percent: 95 %
Brady Statistic RA Percent Paced: 4.4 %
Brady Statistic RV Percent Paced: 1 %
Date Time Interrogation Session: 20241216020037
Implantable Lead Connection Status: 753985
Implantable Lead Connection Status: 753985
Implantable Lead Implant Date: 20220321
Implantable Lead Implant Date: 20220321
Implantable Lead Location: 753859
Implantable Lead Location: 753860
Implantable Pulse Generator Implant Date: 20220321
Lead Channel Impedance Value: 450 Ohm
Lead Channel Impedance Value: 540 Ohm
Lead Channel Pacing Threshold Amplitude: 0.75 V
Lead Channel Pacing Threshold Amplitude: 0.75 V
Lead Channel Pacing Threshold Pulse Width: 0.4 ms
Lead Channel Pacing Threshold Pulse Width: 0.4 ms
Lead Channel Sensing Intrinsic Amplitude: 12 mV
Lead Channel Sensing Intrinsic Amplitude: 4.4 mV
Lead Channel Setting Pacing Amplitude: 2 V
Lead Channel Setting Pacing Amplitude: 2.5 V
Lead Channel Setting Pacing Pulse Width: 0.4 ms
Lead Channel Setting Sensing Sensitivity: 2 mV
Pulse Gen Model: 2272
Pulse Gen Serial Number: 3910619

## 2023-10-20 DIAGNOSIS — G629 Polyneuropathy, unspecified: Secondary | ICD-10-CM | POA: Diagnosis not present

## 2023-10-20 DIAGNOSIS — R079 Chest pain, unspecified: Secondary | ICD-10-CM | POA: Diagnosis not present

## 2023-10-20 DIAGNOSIS — Z86711 Personal history of pulmonary embolism: Secondary | ICD-10-CM | POA: Diagnosis not present

## 2023-10-20 DIAGNOSIS — M542 Cervicalgia: Secondary | ICD-10-CM | POA: Diagnosis not present

## 2023-10-20 DIAGNOSIS — Q246 Congenital heart block: Secondary | ICD-10-CM | POA: Diagnosis not present

## 2023-10-20 DIAGNOSIS — H349 Unspecified retinal vascular occlusion: Secondary | ICD-10-CM | POA: Diagnosis not present

## 2023-10-20 DIAGNOSIS — Z7901 Long term (current) use of anticoagulants: Secondary | ICD-10-CM | POA: Diagnosis not present

## 2023-10-20 DIAGNOSIS — R590 Localized enlarged lymph nodes: Secondary | ICD-10-CM | POA: Diagnosis not present

## 2023-10-20 DIAGNOSIS — Z86718 Personal history of other venous thrombosis and embolism: Secondary | ICD-10-CM | POA: Diagnosis not present

## 2023-10-20 DIAGNOSIS — R0789 Other chest pain: Secondary | ICD-10-CM | POA: Diagnosis not present

## 2023-10-20 DIAGNOSIS — I6623 Occlusion and stenosis of bilateral posterior cerebral arteries: Secondary | ICD-10-CM | POA: Diagnosis not present

## 2023-10-20 DIAGNOSIS — I10 Essential (primary) hypertension: Secondary | ICD-10-CM | POA: Diagnosis not present

## 2023-10-20 DIAGNOSIS — R0602 Shortness of breath: Secondary | ICD-10-CM | POA: Diagnosis not present

## 2023-10-20 DIAGNOSIS — R42 Dizziness and giddiness: Secondary | ICD-10-CM | POA: Diagnosis not present

## 2023-10-20 DIAGNOSIS — I342 Nonrheumatic mitral (valve) stenosis: Secondary | ICD-10-CM | POA: Diagnosis not present

## 2023-10-20 DIAGNOSIS — Z950 Presence of cardiac pacemaker: Secondary | ICD-10-CM | POA: Diagnosis not present

## 2023-10-20 DIAGNOSIS — Z79899 Other long term (current) drug therapy: Secondary | ICD-10-CM | POA: Diagnosis not present

## 2023-10-20 DIAGNOSIS — R9389 Abnormal findings on diagnostic imaging of other specified body structures: Secondary | ICD-10-CM | POA: Diagnosis not present

## 2023-10-20 DIAGNOSIS — Z7902 Long term (current) use of antithrombotics/antiplatelets: Secondary | ICD-10-CM | POA: Diagnosis not present

## 2023-10-20 DIAGNOSIS — Z66 Do not resuscitate: Secondary | ICD-10-CM | POA: Diagnosis not present

## 2023-10-21 DIAGNOSIS — R0789 Other chest pain: Secondary | ICD-10-CM | POA: Diagnosis not present

## 2023-10-23 DIAGNOSIS — I4510 Unspecified right bundle-branch block: Secondary | ICD-10-CM | POA: Diagnosis not present

## 2023-11-13 NOTE — Progress Notes (Signed)
Remote pacemaker transmission.   

## 2023-12-10 NOTE — Progress Notes (Unsigned)
  Electrophysiology Office Note:   ID:  [name], DOB 1927/09/20, MRN [id number]  Primary Cardiologist: None Electrophysiologist: Lewayne Bunting, MD  {Click to update primary MD,subspecialty MD or APP then REFRESH:1}    History of Present Illness:   [name] is a 89 y.o. female with h/o CRAO, HTN, Neuropathy, PE/DVT, and CHB s/p PPM seen today for routine electrophysiology followup.   Since last being seen in our clinic the patient reports doing ***.  she denies chest pain, palpitations, dyspnea, PND, orthopnea, nausea, vomiting, dizziness, syncope, edema, weight gain, or early satiety.   Review of systems complete and found to be negative unless listed in HPI.   EP Information / Studies Reviewed:    EKG is ordered today. Personal review as below.       PPM Interrogation-  reviewed in detail today,  See PACEART report.  Arrhythmia/Device History St. Jude Dual Chamber PPM implanted 01/11/21 for symptomatic bradycardia/AV block    Echo 02/20/2023 LVEF 60-65%, mod MS, trace MR PE CTA 02/17/2023 negative DVT US (left)  4/30 negative  Physical Exam:   VS:  There were no vitals taken for this visit.   Wt Readings from Last 3 Encounters:  06/13/23 199 lb (90.3 kg)  04/07/23 212 lb (96.2 kg)  11/30/22 210 lb (95.3 kg)     GEN: No acute distress  NECK: No JVD; No carotid bruits CARDIAC: {EPRHYTHM:28826}, no murmurs, rubs, gallops RESPIRATORY:  Clear to auscultation without rales, wheezing or rhonchi  ABDOMEN: Soft, non-tender, non-distended EXTREMITIES:  {EDEMA LEVEL:28147::"No"} edema; No deformity   ASSESSMENT AND PLAN:    Advanced AV block s/p Abbott PPM  Normal PPM function See Pace Art report No changes today  Chronic diastolic CHF Volume status *** NYHA *** symptoms Encouraged fluid and salt monitoring  Atypical chest pain Stable, no exertional component.  {Click here to Review PMH, Prob List, Meds, Allergies, SHx, FHx  :1}   Disposition:   Follow up  with {EPPROVIDERS:28135} {EPFOLLOW UP:28173}  Signed, Graciella Freer, PA-C

## 2023-12-11 ENCOUNTER — Ambulatory Visit: Payer: Medicare Other | Attending: Student | Admitting: Student

## 2023-12-11 ENCOUNTER — Encounter: Payer: Self-pay | Admitting: Student

## 2023-12-11 VITALS — BP 140/74 | HR 77 | Resp 16 | Ht 67.0 in | Wt 217.8 lb

## 2023-12-11 DIAGNOSIS — I4430 Unspecified atrioventricular block: Secondary | ICD-10-CM | POA: Diagnosis not present

## 2023-12-11 DIAGNOSIS — Z7901 Long term (current) use of anticoagulants: Secondary | ICD-10-CM | POA: Diagnosis not present

## 2023-12-11 DIAGNOSIS — Z0000 Encounter for general adult medical examination without abnormal findings: Secondary | ICD-10-CM | POA: Diagnosis not present

## 2023-12-11 DIAGNOSIS — R7309 Other abnormal glucose: Secondary | ICD-10-CM | POA: Diagnosis not present

## 2023-12-11 DIAGNOSIS — E039 Hypothyroidism, unspecified: Secondary | ICD-10-CM | POA: Diagnosis not present

## 2023-12-11 DIAGNOSIS — I10 Essential (primary) hypertension: Secondary | ICD-10-CM

## 2023-12-11 DIAGNOSIS — K219 Gastro-esophageal reflux disease without esophagitis: Secondary | ICD-10-CM | POA: Diagnosis not present

## 2023-12-11 DIAGNOSIS — I2510 Atherosclerotic heart disease of native coronary artery without angina pectoris: Secondary | ICD-10-CM | POA: Diagnosis not present

## 2023-12-11 DIAGNOSIS — I252 Old myocardial infarction: Secondary | ICD-10-CM | POA: Diagnosis not present

## 2023-12-11 LAB — CUP PACEART INCLINIC DEVICE CHECK
Battery Remaining Longevity: 114 mo
Battery Voltage: 3.02 V
Brady Statistic RA Percent Paced: 4 %
Brady Statistic RV Percent Paced: 0 %
Date Time Interrogation Session: 20250217094131
Implantable Lead Connection Status: 753985
Implantable Lead Connection Status: 753985
Implantable Lead Implant Date: 20220321
Implantable Lead Implant Date: 20220321
Implantable Lead Location: 753859
Implantable Lead Location: 753860
Implantable Pulse Generator Implant Date: 20220321
Lead Channel Impedance Value: 475 Ohm
Lead Channel Impedance Value: 562.5 Ohm
Lead Channel Pacing Threshold Amplitude: 0.75 V
Lead Channel Pacing Threshold Amplitude: 0.75 V
Lead Channel Pacing Threshold Amplitude: 0.75 V
Lead Channel Pacing Threshold Amplitude: 0.75 V
Lead Channel Pacing Threshold Pulse Width: 0.4 ms
Lead Channel Pacing Threshold Pulse Width: 0.4 ms
Lead Channel Pacing Threshold Pulse Width: 0.4 ms
Lead Channel Pacing Threshold Pulse Width: 0.4 ms
Lead Channel Sensing Intrinsic Amplitude: 12 mV
Lead Channel Sensing Intrinsic Amplitude: 3 mV
Lead Channel Setting Pacing Amplitude: 2 V
Lead Channel Setting Pacing Amplitude: 2.5 V
Lead Channel Setting Pacing Pulse Width: 0.4 ms
Lead Channel Setting Sensing Sensitivity: 2 mV
Pulse Gen Model: 2272
Pulse Gen Serial Number: 3910619

## 2023-12-11 NOTE — Patient Instructions (Signed)
 Medication Instructions:  Your physician recommends that you continue on your current medications as directed. Please refer to the Current Medication list given to you today.  *If you need a refill on your cardiac medications before your next appointment, please call your pharmacy*  Lab Work: None ordered If you have labs (blood work) drawn today and your tests are completely normal, you will receive your results only by: MyChart Message (if you have MyChart) OR A paper copy in the mail If you have any lab test that is abnormal or we need to change your treatment, we will call you to review the results.  Follow-Up: At [facility], you and your health needs are our priority.  As part of our continuing mission to provide you with exceptional heart care, we have created designated Provider Care Teams.  These Care Teams include your primary Cardiologist (physician) and Advanced Practice Providers (APPs -  Physician Assistants and Nurse Practitioners) who all work together to provide you with the care you need, when you need it.  Your next appointment:   6 month(s)  Provider:   Casimiro Needle "Otilio Saber, PA-C

## 2023-12-19 ENCOUNTER — Encounter: Payer: Self-pay | Admitting: Dermatology

## 2023-12-19 ENCOUNTER — Ambulatory Visit (INDEPENDENT_AMBULATORY_CARE_PROVIDER_SITE_OTHER): Payer: Medicare Other | Admitting: Dermatology

## 2023-12-19 VITALS — BP 129/81 | HR 90

## 2023-12-19 DIAGNOSIS — L720 Epidermal cyst: Secondary | ICD-10-CM

## 2023-12-19 NOTE — Progress Notes (Signed)
   New Patient Visit   Subjective  [name] is a 89 y.o. female who presents for the following: growth on the mid upper back Pt has a growth on her right shoulder that has been present for a long time and has gotten larger. She is accompanied by her daughter.  The following portions of the chart were reviewed this encounter and updated as appropriate: medications, allergies, medical history  Review of Systems:  No other skin or systemic complaints except as noted in HPI or Assessment and Plan.  Objective  Well appearing patient in no apparent distress; mood and affect are within normal limits.  A focused examination was performed of the following areas: Mid upper back  Relevant exam findings are noted in the Assessment and Plan.    Assessment & Plan   EPIDERMAL INCLUSION CYST Exam: Subcutaneous nodule at mid upper back with keratin  Benign-appearing. Exam most consistent with an epidermal inclusion cyst. Discussed that a cyst is a benign growth that can grow over time and sometimes get irritated or inflamed. Recommend observation if it is not bothersome. Discussed option of surgical excision to remove it if it is growing, symptomatic, or other changes noted. Please call for new or changing lesions so they can be evaluated. Patient would like to get the lesion removed  Return for the schedule cyst excision.  I, Tillie Fantasia, CMA, am acting as scribe for Gwenith Daily, MD.   Documentation: I have reviewed the above documentation for accuracy and completeness, and I agree with the above.  Gwenith Daily, MD

## 2023-12-19 NOTE — Patient Instructions (Signed)
 Skin Education : I counseled the patient regarding the following: Sun screen (SPF 30 or greater) should be applied during peak UV exposure (between 10am and 2pm) and reapplied after exercise or swimming.  The ABCDEs of melanoma were reviewed with the patient, and the importance of monthly self-examination of moles was emphasized. Should any moles change in shape or color, or itch, bleed or burn, pt will contact our office for evaluation sooner then their interval appointment.  Plan: Sunscreen Recommendations I recommended a broad spectrum sunscreen with a SPF of 30 or higher. I explained that SPF 30 sunscreens block approximately 97 percent of the sun's harmful rays. Sunscreens should be applied at least 15 minutes prior to expected sun exposure and then every 2 hours after that as long as sun exposure continues. If swimming or exercising sunscreen should be reapplied every 45 minutes to an hour after getting wet or sweating. One ounce, or the equivalent of a shot glass full of sunscreen, is adequate to protect the skin not covered by a bathing suit. I also recommended a lip balm with a sunscreen as well. Sun protective clothing can be used in lieu of sunscreen but must be worn the entire time you are exposed to the sun's rays. Skin Education :   We counseled the patient regarding the following: Sun screen (SPF 30 or greater) should be applied during peak UV exposure (between 10am and 2pm) and reapplied after exercise or swimming.  The ABCDEs of melanoma were reviewed with the patient, and the importance of monthly self-examination of moles was emphasized. Should any moles change in shape or color, or itch, bleed or burn, pt will contact our office for evaluation sooner then their interval appointment.  Plan: Sunscreen Recommendations We recommended a broad spectrum sunscreen with a SPF of 30 or higher.  SPF 30 sunscreens block approximately 97 percent of the sun's harmful rays. Sunscreens should be  applied at least 15 minutes prior to expected sun exposure and then every 2 hours after that as long as sun exposure continues. If swimming or exercising sunscreen should be reapplied every 45 minutes to an hour after getting wet or sweating. One ounce, or the equivalent of a shot glass full of sunscreen, is adequate to protect the skin not covered by a bathing suit. We also recommended a lip balm with a sunscreen as well. Sun protective clothing can be used in lieu of sunscreen but must be worn the entire time you are exposed to the sun's rays.   Important Information   Due to recent changes in healthcare laws, you may see results of your pathology and/or laboratory studies on MyChart before the doctors have had a chance to review them. We understand that in some cases there may be results that are confusing or concerning to you. Please understand that not all results are received at the same time and often the doctors may need to interpret multiple results in order to provide you with the best plan of care or course of treatment. Therefore, we ask that you please give [place] 2 business days to thoroughly review all your results before contacting the office for clarification. Should we see a critical lab result, you will be contacted sooner.     If You Need Anything After Your Visit   If you have any questions or concerns for your doctor, please call our main line at [phone]. If no one answers, please leave a voicemail as directed and we will return your call as  soon as possible. Messages left after 4 pm will be answered the following business day.    You may also send [place] a message via MyChart. We typically respond to MyChart messages within 1-2 business days.  For prescription refills, please ask your pharmacy to contact our office. Our fax number is [phone].  If you have an urgent issue when the clinic is closed that cannot wait until the next business day, you can page your doctor at the  number below.     Please note that while we do our best to be available for urgent issues outside of office hours, we are not available 24/7.    If you have an urgent issue and are unable to reach [place], you may choose to seek medical care at your doctor's office, retail clinic, urgent care center, or emergency room.   If you have a medical emergency, please immediately call 911 or go to the emergency department. In the event of inclement weather, please call our main line at [phone] for an update on the status of any delays or closures.  Dermatology Medication Tips: Please keep the boxes that topical medications come in in order to help keep track of the instructions about where and how to use these. Pharmacies typically print the medication instructions only on the boxes and not directly on the medication tubes.   If your medication is too expensive, please contact our office at [phone] or send [place] a message through MyChart.    We are unable to tell what your co-pay for medications will be in advance as this is different depending on your insurance coverage. However, we may be able to find a substitute medication at lower cost or fill out paperwork to get insurance to cover a needed medication.    If a prior authorization is required to get your medication covered by your insurance company, please allow [place] 1-2 business days to complete this process.   Drug prices often vary depending on where the prescription is filled and some pharmacies may offer cheaper prices.   The website www.goodrx.com contains coupons for medications through different pharmacies. The prices here do not account for what the cost may be with help from insurance (it may be cheaper with your insurance), but the website can give you the price if you did not use any insurance.  - You can print the associated coupon and take it with your prescription to the pharmacy.  - You may also stop by our office during regular  business hours and pick up a GoodRx coupon card.  - If you need your prescription sent electronically to a different pharmacy, notify our office through [facility] or by phone at [phone]

## 2024-01-08 ENCOUNTER — Ambulatory Visit (INDEPENDENT_AMBULATORY_CARE_PROVIDER_SITE_OTHER): Payer: Medicare Other

## 2024-01-08 DIAGNOSIS — I4430 Unspecified atrioventricular block: Secondary | ICD-10-CM | POA: Diagnosis not present

## 2024-01-09 ENCOUNTER — Ambulatory Visit: Payer: Medicare Other | Admitting: Dermatology

## 2024-01-09 ENCOUNTER — Encounter: Payer: Self-pay | Admitting: Internal Medicine

## 2024-01-09 LAB — CUP PACEART REMOTE DEVICE CHECK
Battery Remaining Longevity: 101 mo
Battery Remaining Percentage: 80 %
Battery Voltage: 3.02 V
Brady Statistic AP VP Percent: 1 %
Brady Statistic AP VS Percent: 3.5 %
Brady Statistic AS VP Percent: 0 %
Brady Statistic AS VS Percent: 96 %
Brady Statistic RA Percent Paced: 3.1 %
Brady Statistic RV Percent Paced: 1 %
Date Time Interrogation Session: 20250317020013
Implantable Lead Connection Status: 753985
Implantable Lead Connection Status: 753985
Implantable Lead Implant Date: 20220321
Implantable Lead Implant Date: 20220321
Implantable Lead Location: 753859
Implantable Lead Location: 753860
Implantable Pulse Generator Implant Date: 20220321
Lead Channel Impedance Value: 450 Ohm
Lead Channel Impedance Value: 540 Ohm
Lead Channel Pacing Threshold Amplitude: 0.75 V
Lead Channel Pacing Threshold Amplitude: 0.75 V
Lead Channel Pacing Threshold Pulse Width: 0.4 ms
Lead Channel Pacing Threshold Pulse Width: 0.4 ms
Lead Channel Sensing Intrinsic Amplitude: 12 mV
Lead Channel Sensing Intrinsic Amplitude: 3.1 mV
Lead Channel Setting Pacing Amplitude: 2 V
Lead Channel Setting Pacing Amplitude: 2.5 V
Lead Channel Setting Pacing Pulse Width: 0.4 ms
Lead Channel Setting Sensing Sensitivity: 2 mV
Pulse Gen Model: 2272
Pulse Gen Serial Number: 3910619

## 2024-02-20 ENCOUNTER — Emergency Department (HOSPITAL_COMMUNITY)

## 2024-02-20 ENCOUNTER — Emergency Department (HOSPITAL_COMMUNITY)
Admission: EM | Admit: 2024-02-20 | Discharge: 2024-02-20 | Disposition: A | Discharge status: Home / Self Care | Attending: Emergency Medicine | Admitting: Emergency Medicine

## 2024-02-20 ENCOUNTER — Encounter (HOSPITAL_COMMUNITY): Payer: Self-pay | Admitting: Pharmacy Technician

## 2024-02-20 ENCOUNTER — Other Ambulatory Visit: Payer: Self-pay

## 2024-02-20 DIAGNOSIS — K5730 Diverticulosis of large intestine without perforation or abscess without bleeding: Secondary | ICD-10-CM | POA: Diagnosis not present

## 2024-02-20 DIAGNOSIS — R079 Chest pain, unspecified: Secondary | ICD-10-CM | POA: Diagnosis not present

## 2024-02-20 DIAGNOSIS — K5732 Diverticulitis of large intestine without perforation or abscess without bleeding: Secondary | ICD-10-CM | POA: Insufficient documentation

## 2024-02-20 DIAGNOSIS — R1010 Upper abdominal pain, unspecified: Secondary | ICD-10-CM | POA: Diagnosis not present

## 2024-02-20 DIAGNOSIS — Z79899 Other long term (current) drug therapy: Secondary | ICD-10-CM | POA: Insufficient documentation

## 2024-02-20 DIAGNOSIS — K8020 Calculus of gallbladder without cholecystitis without obstruction: Secondary | ICD-10-CM | POA: Diagnosis not present

## 2024-02-20 DIAGNOSIS — R1031 Right lower quadrant pain: Secondary | ICD-10-CM | POA: Diagnosis not present

## 2024-02-20 DIAGNOSIS — I10 Essential (primary) hypertension: Secondary | ICD-10-CM | POA: Insufficient documentation

## 2024-02-20 DIAGNOSIS — Z7901 Long term (current) use of anticoagulants: Secondary | ICD-10-CM | POA: Diagnosis not present

## 2024-02-20 DIAGNOSIS — K5792 Diverticulitis of intestine, part unspecified, without perforation or abscess without bleeding: Secondary | ICD-10-CM

## 2024-02-20 DIAGNOSIS — K429 Umbilical hernia without obstruction or gangrene: Secondary | ICD-10-CM | POA: Diagnosis not present

## 2024-02-20 DIAGNOSIS — E876 Hypokalemia: Secondary | ICD-10-CM | POA: Diagnosis not present

## 2024-02-20 DIAGNOSIS — R069 Unspecified abnormalities of breathing: Secondary | ICD-10-CM | POA: Diagnosis not present

## 2024-02-20 DIAGNOSIS — R9389 Abnormal findings on diagnostic imaging of other specified body structures: Secondary | ICD-10-CM | POA: Diagnosis not present

## 2024-02-20 DIAGNOSIS — Z950 Presence of cardiac pacemaker: Secondary | ICD-10-CM | POA: Diagnosis not present

## 2024-02-20 LAB — COMPREHENSIVE METABOLIC PANEL WITH GFR
ALT: 11 U/L (ref 0–44)
AST: 16 U/L (ref 15–41)
Albumin: 2.9 g/dL — ABNORMAL LOW (ref 3.5–5.0)
Alkaline Phosphatase: 67 U/L (ref 38–126)
Anion gap: 5 (ref 5–15)
BUN: 10 mg/dL (ref 8–23)
CO2: 24 mmol/L (ref 22–32)
Calcium: 7.8 mg/dL — ABNORMAL LOW (ref 8.9–10.3)
Chloride: 113 mmol/L — ABNORMAL HIGH (ref 98–111)
Creatinine, Ser: 0.72 mg/dL (ref 0.44–1.00)
GFR, Estimated: >60 mL/min (ref >60)
Glucose, Bld: 68 mg/dL — ABNORMAL LOW (ref 70–99)
Potassium: 3.3 mmol/L — ABNORMAL LOW (ref 3.5–5.1)
Sodium: 142 mmol/L (ref 135–145)
Total Bilirubin: 0.5 mg/dL (ref 0.0–1.2)
Total Protein: 6 g/dL — ABNORMAL LOW (ref 6.5–8.1)

## 2024-02-20 LAB — CBC
HCT: 37.5 % (ref 36.0–46.0)
Hemoglobin: 12.1 g/dL (ref 12.0–15.0)
MCH: 28 pg (ref 26.0–34.0)
MCHC: 32.3 g/dL (ref 30.0–36.0)
MCV: 86.8 fL (ref 80.0–100.0)
Platelets: 167 10*3/uL (ref 150–400)
RBC: 4.32 MIL/uL (ref 3.87–5.11)
RDW: 14.6 % (ref 11.5–15.5)
WBC: 4.3 10*3/uL (ref 4.0–10.5)
nRBC: 0 % (ref 0.0–0.2)

## 2024-02-20 LAB — URINALYSIS, ROUTINE W REFLEX MICROSCOPIC
Bilirubin Urine: NEGATIVE
Glucose, UA: NEGATIVE mg/dL
Hgb urine dipstick: NEGATIVE
Ketones, ur: NEGATIVE mg/dL
Leukocytes,Ua: NEGATIVE
Nitrite: NEGATIVE
Protein, ur: NEGATIVE mg/dL
Specific Gravity, Urine: 1.008 (ref 1.005–1.030)
pH: 7 (ref 5.0–8.0)

## 2024-02-20 LAB — CBG MONITORING, ED: Glucose-Capillary: 85 mg/dL (ref 70–99)

## 2024-02-20 LAB — LIPASE, BLOOD: Lipase: 28 U/L (ref 11–51)

## 2024-02-20 LAB — TROPONIN I (HIGH SENSITIVITY)
Troponin I (High Sensitivity): 2 ng/L (ref <18)
Troponin I (High Sensitivity): 3 ng/L (ref <18)

## 2024-02-20 MED ORDER — SODIUM CHLORIDE 0.9 % IV BOLUS
1000.0000 mL | Freq: Once | INTRAVENOUS | Status: AC
  Administered 2024-02-20: 1000 mL via INTRAVENOUS

## 2024-02-20 MED ORDER — AMOXICILLIN-POT CLAVULANATE 875-125 MG PO TABS: 1.0000 | ORAL_TABLET | Freq: Three times a day (TID) | ORAL | 0 refills | Status: AC

## 2024-02-20 MED ORDER — AMOXICILLIN-POT CLAVULANATE 875-125 MG PO TABS
1.0000 | ORAL_TABLET | Freq: Once | ORAL | Status: DC
  Filled 2024-02-20: qty 1

## 2024-02-20 NOTE — ED Notes (Signed)
 Pt reports some chest pain, non radiating.

## 2024-02-20 NOTE — Addendum Note (Signed)
 Addended by: Edra Govern D on: 02/20/2024 02:31 PM   Modules accepted: Orders

## 2024-02-20 NOTE — ED Notes (Signed)
 Patient transported to CT

## 2024-02-20 NOTE — H&P (Incomplete)
 History and Physical    [name] NWG:[id number] DOB: 01-30-1927 DOA: 02/20/2024  PCP: Pete Brand, DO   Patient coming from: Home   Chief Complaint:  Chief Complaint  Patient presents with   Abdominal Pain   Shortness of Breath   ED TRIAGE note:  Pt here via ems from home with reports of intermittent shob over the last few weeks. Also with periumbilical pain worsening on Sunday. Endorses dysuria. VSS with ems.       HPI:  [name] is a 89 y.o. female with medical history significant of history of HFpEF, chronic atypical chest pain, chronic muscular pain, history of AV block status post Abbott PPM, diverticulosis,  GERD and hypothyroidism presented emergency department with complaining of  lower abdominal pain for 1 to 2 weeks which has been radiating to upper abdomen with causing shortness of breath.  Patient is also complaining ongoing recent diarrhea.  Denies any fever, chill, nausea, vomiting, flank pain and chest pain.  ED Course:  At presentation to ED patient found hypertensive blood pressure 187/84 otherwise hemodynamically stable. CMP showing low potassium 3.3 otherwise unremarkable. Normal lipase level. CBC unremarkable Normal troponin level. UA unremarkable. EKG showing normal sinus rhythm heart rate 74.  RBBB and LPFB.  Chest x-ray no active disease process. Pending CT abdomen pelvis finding.  Hospitalist has been consulted for further evaluation management of abdominal pain and elevated blood pressure.    Significant labs in the ED:  Lab Orders         Lipase, blood         Comprehensive metabolic panel         CBC         Urinalysis, Routine w reflex microscopic -Urine, Clean Catch         POC CBG, ED       Review of Systems:  ROS  Past Medical History:  Diagnosis Date   Central retinal artery occlusion of right eye 2015   lost complete vision   Edema    Hypertension    not on BP medication currently   Neuropathy     Post-surgical hypothyroidism    Pulmonary emboli (HCC)    VTE (venous thromboembolism) 12/2018   LLE DVT and PE    Past Surgical History:  Procedure Laterality Date   COLONOSCOPY  2019   PACEMAKER IMPLANT N/A 01/11/2021   Procedure: PACEMAKER IMPLANT;  Surgeon: Tammie Fall, MD;  Location: [facility];  Service: Cardiovascular;  Laterality: N/A;   REPLACEMENT TOTAL KNEE Right 2008   TOTAL THYROIDECTOMY       reports that she has never smoked. She has never used smokeless tobacco. She reports that she does not drink alcohol  and does not use drugs.  Allergies  Allergen Reactions   Codeine Other (See Comments)    Caused vertigo to the point of passing out    History reviewed. No pertinent family history.  Prior to Admission medications   Medication Sig Start Date End Date Taking? Authorizing Provider  ELIQUIS  5 MG TABS tablet Take 1 tablet (5 mg total) by mouth 2 (two) times daily. 04/19/21   Garrison Kanner, MD  furosemide  (LASIX ) 20 MG tablet Take 20 mg by mouth daily.    [provider]  isosorbide  mononitrate (IMDUR ) 30 MG 24 hr tablet Take 0.5 tablets (15 mg total) by mouth daily. 04/07/23   Tylene Galla, PA-C  levothyroxine  (SYNTHROID ) 75 MCG tablet Take 75 mcg by mouth daily before breakfast.  [provider]  magnesium  hydroxide (MILK OF MAGNESIA) 400 MG/5ML suspension Take 15-30 mLs by mouth daily as needed for mild constipation.    [provider]  meclizine  (ANTIVERT ) 25 MG tablet Take 25 mg by mouth as needed for dizziness.    [provider]  metoprolol  succinate (TOPROL  XL) 25 MG 24 hr tablet Take 0.5 tablets (12.5 mg total) by mouth daily. 04/07/23   Tylene Galla, PA-C  mirtazapine  (REMERON ) 15 MG tablet Take 15 mg by mouth at bedtime as needed (depression).    [provider]  NON FORMULARY Take 30-60 mLs by mouth See admin instructions. Prune juice- Drink 30-60 ml's by mouth as needed for  constipation    [provider]  omeprazole (PRILOSEC) 20 MG capsule Take 20 mg by mouth as needed (acid reflux).    [provider]  polyvinyl alcohol  (LIQUIFILM TEARS) 1.4 % ophthalmic solution Place 1 drop into both eyes as needed for dry eyes. Patient taking differently: Place 1 drop into the left eye as needed (for blurry vision). 10/05/20   Gonfa, Taye T, MD  pregabalin  (LYRICA ) 75 MG capsule Take 1 capsule (75 mg total) by mouth 2 (two) times daily. 10/05/20   Gonfa, Taye T, MD  traMADol  (ULTRAM ) 50 MG tablet Take 50 mg by mouth as needed for moderate pain (pain score 4-6) or severe pain (pain score 7-10).    [provider]  hydrochlorothiazide  (MICROZIDE ) 12.5 MG capsule Take 1 capsule (12.5 mg total) by mouth daily. Patient not taking: Reported on 01/09/2021 10/05/20 04/12/21  Theadore Finger, MD     Physical Exam: Vitals:   02/20/24 1659 02/20/24 1730 02/20/24 1753  BP: (!) 187/84 (!) 174/89   Pulse: 71 68 67  Resp: 20 (!) 22 20  Temp: 98.6 F (37 C)    SpO2: 100% 99% 100%    Physical Exam   Labs on Admission: I have personally reviewed following labs and imaging studies  CBC: Recent Labs  Lab 02/20/24 1659  WBC 4.3  HGB 12.1  HCT 37.5  MCV 86.8  PLT 167   Basic Metabolic Panel: Recent Labs  Lab 02/20/24 1659  NA 142  K 3.3*  CL 113*  CO2 24  GLUCOSE 68*  BUN 10  CREATININE 0.72  CALCIUM 7.8*   GFR: CrCl cannot be calculated (Unknown ideal weight.). Liver Function Tests: Recent Labs  Lab 02/20/24 1659  AST 16  ALT 11  ALKPHOS 67  BILITOT 0.5  PROT 6.0*  ALBUMIN 2.9*   Recent Labs  Lab 02/20/24 1659  LIPASE 28   No results for input(s): "AMMONIA" in the last 168 hours. Coagulation Profile: No results for input(s): "INR", "PROTIME" in the last 168 hours. Cardiac Enzymes: Recent Labs  Lab 02/20/24 1659  TROPONINIHS 2   BNP (last 3 results) No results for input(s): "BNP" in the last 8760 hours. HbA1C: No  results for input(s): "HGBA1C" in the last 72 hours. CBG: No results for input(s): "GLUCAP" in the last 168 hours. Lipid Profile: No results for input(s): "CHOL", "HDL", "LDLCALC", "TRIG", "CHOLHDL", "LDLDIRECT" in the last 72 hours. Thyroid  Function Tests: No results for input(s): "TSH", "T4TOTAL", "FREET4", "T3FREE", "THYROIDAB" in the last 72 hours. Anemia Panel: No results for input(s): "VITAMINB12", "FOLATE", "FERRITIN", "TIBC", "IRON", "RETICCTPCT" in the last 72 hours. Urine analysis:    Component Value Date/Time   COLORURINE STRAW (A) 02/20/2024 1858   APPEARANCEUR CLEAR 02/20/2024 1858   LABSPEC 1.008 02/20/2024 1858  PHURINE 7.0 02/20/2024 1858   GLUCOSEU NEGATIVE 02/20/2024 1858   HGBUR NEGATIVE 02/20/2024 1858   BILIRUBINUR NEGATIVE 02/20/2024 1858   KETONESUR NEGATIVE 02/20/2024 1858   PROTEINUR NEGATIVE 02/20/2024 1858   NITRITE NEGATIVE 02/20/2024 1858   LEUKOCYTESUR NEGATIVE 02/20/2024 1858    Radiological Exams on Admission: I have personally reviewed images DG Chest 2 View Result Date: 02/20/2024 CLINICAL DATA:  Chest pain. EXAM: CHEST - 2 VIEW COMPARISON:  October 20, 2023. FINDINGS: Stable cardiomediastinal silhouette. Left-sided pacemaker is unchanged. Elevated left hemidiaphragm is noted. No acute pulmonary disease. Bony thorax is unremarkable. IMPRESSION: No active cardiopulmonary disease. Electronically Signed   By: Rosalene Colon M.D.   On: 02/20/2024 17:24     EKG: My personal interpretation of EKG shows: ***    Assessment/Plan: Active Problems:   * No active hospital problems. *    Assessment and Plan: No notes have been filed under this hospital service. Service: Hospitalist      DVT prophylaxis:  {Blank single:19197::"Lovenox","SQ Heparin ","IV heparin  gtts","Xarelto","Eliquis ","Coumadin","SCDs","***"} Code Status:  {Blank single:19197::"Full Code","DNR with Intubation","DNR/DNI(Do NOT Intubate)","Comfort Care","***"} Diet:  Family  Communication:  *** Family was present at bedside, at the time of interview.  Opportunity was given to ask question and all questions were answered satisfactorily.  Disposition Plan:  ***  Consults:  ***  Admission status:   {Blank single:19197::"Observation","Inpatient"}, {Blank single:19197::"Med-Surg","Telemetry bed","Step Down Unit"}  Severity of Illness: {Observation/Inpatient:21159}    Jetty Mort, MD [facility]  How to contact the [facility] Attending or Consulting provider 7A - 7P or covering provider during after hours 7P -7A, for this patient.  Check the care team in [facility] and look for a) attending/consulting [facility] provider listed and b) the [facility] team listed Log into [email or web address] and use [place] universal password to access. If you do not have the password, please contact the hospital operator. Locate the [facility] provider you are looking for under [facility] and page to a number that you can be directly reached. If you still have difficulty reaching the provider, please page the [facility] (Director on Call) for the Hospitalists listed on amion for assistance.  02/20/2024, 8:01 PM

## 2024-02-20 NOTE — Discharge Instructions (Signed)
 You were seen in the emerged part for abdominal pain The CAT scan was suggestive of diverticulitis For this reason we gave your first dose of antibiotics here and called our [facility] pharmacy Please pick up this medication tomorrow morning and begin taking as directed every 8 hours for the next 5 days Follow-up with your primary care doctor in 1 week for reevaluation Return to the emergency room for severe abdominal pain, fevers or any other concerns

## 2024-02-20 NOTE — Progress Notes (Signed)
 Remote pacemaker transmission.

## 2024-02-20 NOTE — ED Provider Notes (Signed)
 [place] EMERGENCY DEPARTMENT AT [facility] Provider Note   CSN: [id number] Arrival date & time: 02/20/24  1649     History  Chief Complaint  Patient presents with   Abdominal Pain   Shortness of Breath    [name] is a 89 y.o. female.  With a history of PE on Eliquis , hypertension and diverticulosis presents to ED for abdominal pain.  She has experienced about 1 to 2 weeks of lower abdominal pain localized bilateral lower abdomen with some radiation up causing shortness of breath.  Has had recent diarrhea.  No nausea vomiting urinary symptoms flank pain or chest pain.   Abdominal Pain Associated symptoms: shortness of breath   Shortness of Breath Associated symptoms: abdominal pain        Home Medications Prior to Admission medications   Medication Sig Start Date End Date Taking? Authorizing Provider  amoxicillin-clavulanate (AUGMENTIN) 875-125 MG tablet Take 1 tablet by mouth every 8 (eight) hours for 5 days. 02/20/24 02/25/24 Yes Sallyanne Creamer, DO  ELIQUIS  5 MG TABS tablet Take 1 tablet (5 mg total) by mouth 2 (two) times daily. 04/19/21   Garrison Kanner, MD  furosemide  (LASIX ) 20 MG tablet Take 20 mg by mouth daily.    [provider]  isosorbide  mononitrate (IMDUR ) 30 MG 24 hr tablet Take 0.5 tablets (15 mg total) by mouth daily. 04/07/23   Tylene Galla, PA-C  levothyroxine  (SYNTHROID ) 75 MCG tablet Take 75 mcg by mouth daily before breakfast.    [provider]  magnesium  hydroxide (MILK OF MAGNESIA) 400 MG/5ML suspension Take 15-30 mLs by mouth daily as needed for mild constipation.    [provider]  meclizine  (ANTIVERT ) 25 MG tablet Take 25 mg by mouth as needed for dizziness.    [provider]  metoprolol  succinate (TOPROL  XL) 25 MG 24 hr tablet Take 0.5 tablets (12.5 mg total) by mouth daily. 04/07/23   Tylene Galla, PA-C  mirtazapine  (REMERON ) 15 MG tablet Take 15 mg by mouth at bedtime as needed  (depression).    [provider]  NON FORMULARY Take 30-60 mLs by mouth See admin instructions. Prune juice- Drink 30-60 ml's by mouth as needed for constipation    [provider]  omeprazole (PRILOSEC) 20 MG capsule Take 20 mg by mouth as needed (acid reflux).    [provider]  polyvinyl alcohol  (LIQUIFILM TEARS) 1.4 % ophthalmic solution Place 1 drop into both eyes as needed for dry eyes. Patient taking differently: Place 1 drop into the left eye as needed (for blurry vision). 10/05/20   Gonfa, Taye T, MD  pregabalin  (LYRICA ) 75 MG capsule Take 1 capsule (75 mg total) by mouth 2 (two) times daily. 10/05/20   Gonfa, Taye T, MD  traMADol  (ULTRAM ) 50 MG tablet Take 50 mg by mouth as needed for moderate pain (pain score 4-6) or severe pain (pain score 7-10).    [provider]  hydrochlorothiazide  (MICROZIDE ) 12.5 MG capsule Take 1 capsule (12.5 mg total) by mouth daily. Patient not taking: Reported on 01/09/2021 10/05/20 04/12/21  Gonfa, Taye T, MD      Allergies    Codeine    Review of Systems   Review of Systems  Respiratory:  Positive for shortness of breath.   Gastrointestinal:  Positive for abdominal pain.    Physical Exam Updated Vital Signs BP (!) 151/83   Pulse 75   Temp 97.8 F (36.6 C) (Oral)   Resp (!) 24  SpO2 100%  Physical Exam Vitals and nursing note reviewed.  HENT:     Head: Normocephalic and atraumatic.  Eyes:     Pupils: Pupils are equal, round, and reactive to light.  Cardiovascular:     Rate and Rhythm: Normal rate and regular rhythm.  Pulmonary:     Effort: Pulmonary effort is normal.     Breath sounds: Normal breath sounds.  Abdominal:     Palpations: Abdomen is soft.     Tenderness: There is abdominal tenderness in the right lower quadrant, suprapubic area and left lower quadrant.  Skin:    General: Skin is warm and dry.  Neurological:     Mental Status: She is alert.  Psychiatric:        Mood and Affect: Mood  normal.     ED Results / Procedures / Treatments   Labs (all labs ordered are listed, but only abnormal results are displayed) Labs Reviewed  COMPREHENSIVE METABOLIC PANEL WITH GFR - Abnormal; Notable for the following components:      Result Value   Potassium 3.3 (*)    Chloride 113 (*)    Glucose, Bld 68 (*)    Calcium 7.8 (*)    Total Protein 6.0 (*)    Albumin 2.9 (*)    All other components within normal limits  URINALYSIS, ROUTINE W REFLEX MICROSCOPIC - Abnormal; Notable for the following components:   Color, Urine STRAW (*)    All other components within normal limits  LIPASE, BLOOD  CBC  CBG MONITORING, ED  TROPONIN I (HIGH SENSITIVITY)  TROPONIN I (HIGH SENSITIVITY)    EKG EKG Interpretation Date/Time:  Tuesday February 20 2024 16:59:08 EDT Ventricular Rate:  74 PR Interval:  150 QRS Duration:  128 QT Interval:  412 QTC Calculation: 458 R Axis:   106  Text Interpretation: Sinus rhythm Probable left atrial enlargement RBBB and LPFB Borderline ST elevation, lateral leads Confirmed by Rafael Bun [phone]) on 02/20/2024 5:46:46 PM  Radiology CT ABDOMEN PELVIS WO CONTRAST Result Date: 02/20/2024 CLINICAL DATA:  Left lower quadrant abdominal pain. EXAM: CT ABDOMEN AND PELVIS WITHOUT CONTRAST TECHNIQUE: Multidetector CT imaging of the abdomen and pelvis was performed following the standard protocol without IV contrast. RADIATION DOSE REDUCTION: This exam was performed according to the departmental dose-optimization program which includes automated exposure control, adjustment of the mA and/or kV according to patient size and/or use of iterative reconstruction technique. COMPARISON:  CTs with IV contrast 04/12/2021 and 12/24/2020. FINDINGS: Lower chest: Linear atelectasis or scarring noted both lower lobes and a calcified left lower lobe granuloma. There is a trace right pleural effusion, new since prior studies. Eventration and slight elevation left hemidiaphragm. Lung bases  are otherwise clear. The cardiac size is normal. There is no pericardial effusion. The mitral ring again noted heavily calcified. There is artifact from pacemaker wires in the heart. There are three-vessel coronary calcifications heaviest in the LAD. Hepatobiliary: Limited fine detail in the liver due to streak artifact from the patient's arms in the field and breast attenuation. There is no obvious liver mass visible through these artifacts. There are clustered tiny stones in the distal gallbladder without wall thickening or bile duct dilatation. Pancreas: Unremarkable without contrast. Spleen: Unremarkable without contrast.  No splenomegaly. Adrenals/Urinary Tract: Slight chronic nodular thickening both adrenal glands, stable. There are Bosniak 1 cysts in the upper pole of both kidneys, on the left measuring 2.7 cm and -0.1 Hounsfield units, on the right measuring 2.2 cm and 1.5 Hounsfield  units. A few additional subcentimeter Bosniak 2 cortical cysts are noted which are too small characterize. No follow-up imaging is recommended and no appreciable change since the prior CTs. There is no masslike contour deforming abnormality of the kidneys. There is no urinary stone or obstruction.  Bladder is unremarkable. Stomach/Bowel: Unremarkable contracted stomach. Normal caliber unopacified small bowel. An appendix is not seen in this patient. There is mild-to-moderate fecal stasis. Diffuse colonic diverticulosis heaviest in the sigmoid segment. There is colonic interposition of the splenic flexure beneath the left diaphragmatic dome. There is questionable faint interstitial haziness anterior to the proximal sigmoid colon over a short segment on axial images 65-67 of series 3. Mild uncomplicated proximal sigmoid diverticulitis is not excluded. No other regional inflammatory changes are suspected. There is no free air or diverticular abscess. Vascular/Lymphatic: Aortic atherosclerosis. No enlarged abdominal or pelvic lymph  nodes. Reproductive: Status post hysterectomy. No adnexal masses. Other: Small umbilical fat hernia.  No incarcerated hernia. Trace posterior deep pelvic ascites. No free hemorrhage or free air. Musculoskeletal: Chronic burst fracture with severe height loss again noted T12, with anterior bridging osteophytes to the adjacent segments. There is osteopenia with degenerative changes of the spine. Acquired spinal stenosis L3-4 and L4-5. Moderately advanced bilateral hip DJD. No acute or other significant osseous findings. IMPRESSION: 1. Constipation and diverticulosis. 2. Questionable faint interstitial haziness anterior to the proximal sigmoid colon over a short segment. Mild uncomplicated proximal sigmoid diverticulitis is not excluded. No free air or diverticular abscess. 3. Trace right pleural effusion, new since prior studies. 4. Aortic and coronary artery atherosclerosis. Heavily calcified mitral ring. 5. Cholelithiasis. 6. Trace posterior deep pelvic ascites. 7. Umbilical fat hernia. 8. Osteopenia and degenerative change. 9. Chronic burst fracture with severe height loss T12. Aortic Atherosclerosis (ICD10-I70.0). Electronically Signed   By: Denman Fischer M.D.   On: 02/20/2024 21:25   DG Chest 2 View Result Date: 02/20/2024 CLINICAL DATA:  Chest pain. EXAM: CHEST - 2 VIEW COMPARISON:  October 20, 2023. FINDINGS: Stable cardiomediastinal silhouette. Left-sided pacemaker is unchanged. Elevated left hemidiaphragm is noted. No acute pulmonary disease. Bony thorax is unremarkable. IMPRESSION: No active cardiopulmonary disease. Electronically Signed   By: Rosalene Colon M.D.   On: 02/20/2024 17:24    Procedures Procedures    Medications Ordered in ED Medications  amoxicillin-clavulanate (AUGMENTIN) 875-125 MG per tablet 1 tablet (has no administration in time range)  sodium chloride  0.9 % bolus 1,000 mL (0 mLs Intravenous Stopped 02/20/24 1927)    ED Course/ Medical Decision Making/ A&P Clinical  Course as of 02/20/24 2232  Tue Feb 20, 2024  2228 No UTI.  Troponin negative.  No leukocytosis.  Slight hypokalemia.  Repeat blood glucose within normal limits.  CT abdomen pelvis shows some chronic findings suggestive of potential mid sigmoid diverticulitis.  Discussed "watch and wait" approach versus antibiotic treatment for suspected diverticulitis.  Patient wishes to start antibiotic therapy and follow-up with her PCP.  Will treat as outpatient with Augmentin and give the first dose here [MP]    Clinical Course User Index [MP] Sallyanne Creamer, DO                                 Medical Decision Making 89 year old female with history as above presenting to the ED for 1 to 2 weeks of lower abdominal pain with some associated shortness of breath.  No fevers or chills.  Afebrile and hypertensive  here.  Exam notable for bilateral lower abdominal tenderness.    Differential diagnosis includes: Acute intra-abdominal infectious/inflammatory process such as appendicitis, diverticulitis, pancreatitis and cholecystitis Urinary tract infection Atypical presentation for pneumonia Atypical ACS and elderly female Viral gastroenteritis  Will obtain laboratory workup including CBC with differential, metabolic panel, troponin, lipase and urinalysis along with chest x-ray and CT abdomen pelvis  Given history of diarrhea will provide IV fluids for rehydration  Amount and/or Complexity of Data Reviewed Labs: ordered. Radiology: ordered.  Risk Prescription drug management.           Final Clinical Impression(s) / ED Diagnoses Final diagnoses:  Diverticulitis    Rx / DC Orders ED Discharge Orders          Ordered    amoxicillin-clavulanate (AUGMENTIN) 875-125 MG tablet  Every 8 hours        02/20/24 2231              Sallyanne Creamer, DO 02/20/24 2232

## 2024-02-20 NOTE — ED Triage Notes (Signed)
 Pt here via ems from home with reports of intermittent shob over the last few weeks. Also with periumbilical pain worsening on Sunday. Endorses dysuria. VSS with ems.

## 2024-04-02 DIAGNOSIS — R195 Other fecal abnormalities: Secondary | ICD-10-CM | POA: Diagnosis not present

## 2024-04-02 DIAGNOSIS — Z950 Presence of cardiac pacemaker: Secondary | ICD-10-CM | POA: Diagnosis not present

## 2024-04-02 DIAGNOSIS — R1084 Generalized abdominal pain: Secondary | ICD-10-CM | POA: Diagnosis not present

## 2024-04-02 DIAGNOSIS — R0789 Other chest pain: Secondary | ICD-10-CM | POA: Diagnosis not present

## 2024-04-02 DIAGNOSIS — Z86711 Personal history of pulmonary embolism: Secondary | ICD-10-CM | POA: Diagnosis not present

## 2024-04-02 DIAGNOSIS — I10 Essential (primary) hypertension: Secondary | ICD-10-CM | POA: Diagnosis not present

## 2024-04-02 DIAGNOSIS — M62838 Other muscle spasm: Secondary | ICD-10-CM | POA: Diagnosis not present

## 2024-04-02 DIAGNOSIS — I4510 Unspecified right bundle-branch block: Secondary | ICD-10-CM | POA: Diagnosis not present

## 2024-04-02 DIAGNOSIS — M25512 Pain in left shoulder: Secondary | ICD-10-CM | POA: Diagnosis not present

## 2024-04-02 DIAGNOSIS — R9389 Abnormal findings on diagnostic imaging of other specified body structures: Secondary | ICD-10-CM | POA: Diagnosis not present

## 2024-04-02 DIAGNOSIS — R079 Chest pain, unspecified: Secondary | ICD-10-CM | POA: Diagnosis not present

## 2024-04-02 DIAGNOSIS — Z86718 Personal history of other venous thrombosis and embolism: Secondary | ICD-10-CM | POA: Diagnosis not present

## 2024-04-02 DIAGNOSIS — R600 Localized edema: Secondary | ICD-10-CM | POA: Diagnosis not present

## 2024-04-02 DIAGNOSIS — M542 Cervicalgia: Secondary | ICD-10-CM | POA: Diagnosis not present

## 2024-04-02 DIAGNOSIS — Z885 Allergy status to narcotic agent status: Secondary | ICD-10-CM | POA: Diagnosis not present

## 2024-04-04 DIAGNOSIS — H04123 Dry eye syndrome of bilateral lacrimal glands: Secondary | ICD-10-CM | POA: Diagnosis not present

## 2024-04-04 DIAGNOSIS — Z961 Presence of intraocular lens: Secondary | ICD-10-CM | POA: Diagnosis not present

## 2024-04-04 DIAGNOSIS — H43812 Vitreous degeneration, left eye: Secondary | ICD-10-CM | POA: Diagnosis not present

## 2024-04-08 ENCOUNTER — Ambulatory Visit (INDEPENDENT_AMBULATORY_CARE_PROVIDER_SITE_OTHER): Payer: Medicare Other

## 2024-04-08 DIAGNOSIS — Z66 Do not resuscitate: Secondary | ICD-10-CM | POA: Diagnosis not present

## 2024-04-08 DIAGNOSIS — I10 Essential (primary) hypertension: Secondary | ICD-10-CM | POA: Diagnosis not present

## 2024-04-08 DIAGNOSIS — R110 Nausea: Secondary | ICD-10-CM | POA: Diagnosis not present

## 2024-04-08 DIAGNOSIS — K429 Umbilical hernia without obstruction or gangrene: Secondary | ICD-10-CM | POA: Diagnosis not present

## 2024-04-08 DIAGNOSIS — N3090 Cystitis, unspecified without hematuria: Secondary | ICD-10-CM | POA: Diagnosis not present

## 2024-04-08 DIAGNOSIS — N390 Urinary tract infection, site not specified: Secondary | ICD-10-CM | POA: Diagnosis not present

## 2024-04-08 DIAGNOSIS — Z86718 Personal history of other venous thrombosis and embolism: Secondary | ICD-10-CM | POA: Diagnosis not present

## 2024-04-08 DIAGNOSIS — Z86711 Personal history of pulmonary embolism: Secondary | ICD-10-CM | POA: Diagnosis not present

## 2024-04-08 DIAGNOSIS — I4430 Unspecified atrioventricular block: Secondary | ICD-10-CM | POA: Diagnosis not present

## 2024-04-08 DIAGNOSIS — R197 Diarrhea, unspecified: Secondary | ICD-10-CM | POA: Diagnosis not present

## 2024-04-08 DIAGNOSIS — Z7901 Long term (current) use of anticoagulants: Secondary | ICD-10-CM | POA: Diagnosis not present

## 2024-04-08 DIAGNOSIS — K5730 Diverticulosis of large intestine without perforation or abscess without bleeding: Secondary | ICD-10-CM | POA: Diagnosis not present

## 2024-04-08 DIAGNOSIS — I4510 Unspecified right bundle-branch block: Secondary | ICD-10-CM | POA: Diagnosis not present

## 2024-04-08 DIAGNOSIS — M6281 Muscle weakness (generalized): Secondary | ICD-10-CM | POA: Diagnosis not present

## 2024-04-08 DIAGNOSIS — K5900 Constipation, unspecified: Secondary | ICD-10-CM | POA: Diagnosis not present

## 2024-04-08 DIAGNOSIS — I050 Rheumatic mitral stenosis: Secondary | ICD-10-CM | POA: Diagnosis not present

## 2024-04-08 DIAGNOSIS — R1030 Lower abdominal pain, unspecified: Secondary | ICD-10-CM | POA: Diagnosis not present

## 2024-04-08 DIAGNOSIS — Z950 Presence of cardiac pacemaker: Secondary | ICD-10-CM | POA: Diagnosis not present

## 2024-04-08 DIAGNOSIS — Z79890 Hormone replacement therapy (postmenopausal): Secondary | ICD-10-CM | POA: Diagnosis not present

## 2024-04-08 DIAGNOSIS — K8020 Calculus of gallbladder without cholecystitis without obstruction: Secondary | ICD-10-CM | POA: Diagnosis not present

## 2024-04-08 DIAGNOSIS — N3000 Acute cystitis without hematuria: Secondary | ICD-10-CM | POA: Diagnosis not present

## 2024-04-08 DIAGNOSIS — R1084 Generalized abdominal pain: Secondary | ICD-10-CM | POA: Diagnosis not present

## 2024-04-08 DIAGNOSIS — N281 Cyst of kidney, acquired: Secondary | ICD-10-CM | POA: Diagnosis not present

## 2024-04-08 DIAGNOSIS — Z7409 Other reduced mobility: Secondary | ICD-10-CM | POA: Diagnosis not present

## 2024-04-08 DIAGNOSIS — Z7902 Long term (current) use of antithrombotics/antiplatelets: Secondary | ICD-10-CM | POA: Diagnosis not present

## 2024-04-08 DIAGNOSIS — Z885 Allergy status to narcotic agent status: Secondary | ICD-10-CM | POA: Diagnosis not present

## 2024-04-08 DIAGNOSIS — R109 Unspecified abdominal pain: Secondary | ICD-10-CM | POA: Diagnosis not present

## 2024-04-08 DIAGNOSIS — E039 Hypothyroidism, unspecified: Secondary | ICD-10-CM | POA: Diagnosis not present

## 2024-04-08 LAB — CUP PACEART REMOTE DEVICE CHECK
Battery Remaining Longevity: 98 mo
Battery Remaining Percentage: 78 %
Battery Voltage: 3.02 V
Brady Statistic AP VP Percent: 1 %
Brady Statistic AP VS Percent: 2.3 %
Brady Statistic AS VP Percent: 0 %
Brady Statistic AS VS Percent: 98 %
Brady Statistic RA Percent Paced: 2 %
Brady Statistic RV Percent Paced: 1 %
Date Time Interrogation Session: 20250616042513
Implantable Lead Connection Status: 753985
Implantable Lead Connection Status: 753985
Implantable Lead Implant Date: 20220321
Implantable Lead Implant Date: 20220321
Implantable Lead Location: 753859
Implantable Lead Location: 753860
Implantable Pulse Generator Implant Date: 20220321
Lead Channel Impedance Value: 450 Ohm
Lead Channel Impedance Value: 510 Ohm
Lead Channel Pacing Threshold Amplitude: 0.75 V
Lead Channel Pacing Threshold Amplitude: 0.75 V
Lead Channel Pacing Threshold Pulse Width: 0.4 ms
Lead Channel Pacing Threshold Pulse Width: 0.4 ms
Lead Channel Sensing Intrinsic Amplitude: 12 mV
Lead Channel Sensing Intrinsic Amplitude: 2.3 mV
Lead Channel Setting Pacing Amplitude: 2 V
Lead Channel Setting Pacing Amplitude: 2.5 V
Lead Channel Setting Pacing Pulse Width: 0.4 ms
Lead Channel Setting Sensing Sensitivity: 2 mV
Pulse Gen Model: 2272
Pulse Gen Serial Number: 3910619

## 2024-04-09 DIAGNOSIS — N3090 Cystitis, unspecified without hematuria: Secondary | ICD-10-CM | POA: Diagnosis not present

## 2024-04-09 DIAGNOSIS — Z7409 Other reduced mobility: Secondary | ICD-10-CM | POA: Diagnosis not present

## 2024-04-09 DIAGNOSIS — Z86718 Personal history of other venous thrombosis and embolism: Secondary | ICD-10-CM | POA: Diagnosis not present

## 2024-04-09 DIAGNOSIS — R1030 Lower abdominal pain, unspecified: Secondary | ICD-10-CM | POA: Diagnosis not present

## 2024-04-09 DIAGNOSIS — I4510 Unspecified right bundle-branch block: Secondary | ICD-10-CM | POA: Diagnosis not present

## 2024-04-10 ENCOUNTER — Ambulatory Visit: Payer: Self-pay | Admitting: Internal Medicine

## 2024-04-10 DIAGNOSIS — K5900 Constipation, unspecified: Secondary | ICD-10-CM | POA: Diagnosis not present

## 2024-04-10 DIAGNOSIS — Z7409 Other reduced mobility: Secondary | ICD-10-CM | POA: Diagnosis not present

## 2024-04-10 DIAGNOSIS — R109 Unspecified abdominal pain: Secondary | ICD-10-CM | POA: Diagnosis not present

## 2024-04-10 DIAGNOSIS — R1030 Lower abdominal pain, unspecified: Secondary | ICD-10-CM | POA: Diagnosis not present

## 2024-04-10 DIAGNOSIS — Z86718 Personal history of other venous thrombosis and embolism: Secondary | ICD-10-CM | POA: Diagnosis not present

## 2024-04-10 DIAGNOSIS — N3090 Cystitis, unspecified without hematuria: Secondary | ICD-10-CM | POA: Diagnosis not present

## 2024-04-11 DIAGNOSIS — N3090 Cystitis, unspecified without hematuria: Secondary | ICD-10-CM | POA: Diagnosis not present

## 2024-04-11 DIAGNOSIS — Z86718 Personal history of other venous thrombosis and embolism: Secondary | ICD-10-CM | POA: Diagnosis not present

## 2024-04-11 DIAGNOSIS — R1030 Lower abdominal pain, unspecified: Secondary | ICD-10-CM | POA: Diagnosis not present

## 2024-04-16 DIAGNOSIS — K760 Fatty (change of) liver, not elsewhere classified: Secondary | ICD-10-CM | POA: Diagnosis not present

## 2024-04-16 DIAGNOSIS — K5730 Diverticulosis of large intestine without perforation or abscess without bleeding: Secondary | ICD-10-CM | POA: Diagnosis not present

## 2024-04-16 DIAGNOSIS — I10 Essential (primary) hypertension: Secondary | ICD-10-CM | POA: Diagnosis not present

## 2024-04-16 DIAGNOSIS — I700 Atherosclerosis of aorta: Secondary | ICD-10-CM | POA: Diagnosis not present

## 2024-04-16 DIAGNOSIS — E039 Hypothyroidism, unspecified: Secondary | ICD-10-CM | POA: Diagnosis not present

## 2024-04-16 DIAGNOSIS — Z7901 Long term (current) use of anticoagulants: Secondary | ICD-10-CM | POA: Diagnosis not present

## 2024-04-16 DIAGNOSIS — Z86718 Personal history of other venous thrombosis and embolism: Secondary | ICD-10-CM | POA: Diagnosis not present

## 2024-04-16 DIAGNOSIS — N3000 Acute cystitis without hematuria: Secondary | ICD-10-CM | POA: Diagnosis not present

## 2024-04-16 DIAGNOSIS — Z86711 Personal history of pulmonary embolism: Secondary | ICD-10-CM | POA: Diagnosis not present

## 2024-04-16 DIAGNOSIS — Z604 Social exclusion and rejection: Secondary | ICD-10-CM | POA: Diagnosis not present

## 2024-04-16 DIAGNOSIS — K8020 Calculus of gallbladder without cholecystitis without obstruction: Secondary | ICD-10-CM | POA: Diagnosis not present

## 2024-04-17 DIAGNOSIS — Z604 Social exclusion and rejection: Secondary | ICD-10-CM | POA: Diagnosis not present

## 2024-04-17 DIAGNOSIS — I700 Atherosclerosis of aorta: Secondary | ICD-10-CM | POA: Diagnosis not present

## 2024-04-17 DIAGNOSIS — Z7901 Long term (current) use of anticoagulants: Secondary | ICD-10-CM | POA: Diagnosis not present

## 2024-04-17 DIAGNOSIS — R109 Unspecified abdominal pain: Secondary | ICD-10-CM | POA: Diagnosis not present

## 2024-04-17 DIAGNOSIS — E039 Hypothyroidism, unspecified: Secondary | ICD-10-CM | POA: Diagnosis not present

## 2024-04-17 DIAGNOSIS — Z86718 Personal history of other venous thrombosis and embolism: Secondary | ICD-10-CM | POA: Diagnosis not present

## 2024-04-17 DIAGNOSIS — R54 Age-related physical debility: Secondary | ICD-10-CM | POA: Diagnosis not present

## 2024-04-17 DIAGNOSIS — Z86711 Personal history of pulmonary embolism: Secondary | ICD-10-CM | POA: Diagnosis not present

## 2024-04-17 DIAGNOSIS — N390 Urinary tract infection, site not specified: Secondary | ICD-10-CM | POA: Diagnosis not present

## 2024-04-17 DIAGNOSIS — N3000 Acute cystitis without hematuria: Secondary | ICD-10-CM | POA: Diagnosis not present

## 2024-04-17 DIAGNOSIS — I10 Essential (primary) hypertension: Secondary | ICD-10-CM | POA: Diagnosis not present

## 2024-04-17 DIAGNOSIS — Z09 Encounter for follow-up examination after completed treatment for conditions other than malignant neoplasm: Secondary | ICD-10-CM | POA: Diagnosis not present

## 2024-04-17 DIAGNOSIS — K760 Fatty (change of) liver, not elsewhere classified: Secondary | ICD-10-CM | POA: Diagnosis not present

## 2024-04-17 DIAGNOSIS — K5730 Diverticulosis of large intestine without perforation or abscess without bleeding: Secondary | ICD-10-CM | POA: Diagnosis not present

## 2024-04-17 DIAGNOSIS — K8020 Calculus of gallbladder without cholecystitis without obstruction: Secondary | ICD-10-CM | POA: Diagnosis not present

## 2024-04-25 DIAGNOSIS — K760 Fatty (change of) liver, not elsewhere classified: Secondary | ICD-10-CM | POA: Diagnosis not present

## 2024-04-25 DIAGNOSIS — K5730 Diverticulosis of large intestine without perforation or abscess without bleeding: Secondary | ICD-10-CM | POA: Diagnosis not present

## 2024-04-25 DIAGNOSIS — I700 Atherosclerosis of aorta: Secondary | ICD-10-CM | POA: Diagnosis not present

## 2024-04-25 DIAGNOSIS — E039 Hypothyroidism, unspecified: Secondary | ICD-10-CM | POA: Diagnosis not present

## 2024-04-25 DIAGNOSIS — Z604 Social exclusion and rejection: Secondary | ICD-10-CM | POA: Diagnosis not present

## 2024-04-25 DIAGNOSIS — N3000 Acute cystitis without hematuria: Secondary | ICD-10-CM | POA: Diagnosis not present

## 2024-04-25 DIAGNOSIS — Z86711 Personal history of pulmonary embolism: Secondary | ICD-10-CM | POA: Diagnosis not present

## 2024-04-25 DIAGNOSIS — Z86718 Personal history of other venous thrombosis and embolism: Secondary | ICD-10-CM | POA: Diagnosis not present

## 2024-04-25 DIAGNOSIS — I10 Essential (primary) hypertension: Secondary | ICD-10-CM | POA: Diagnosis not present

## 2024-04-25 DIAGNOSIS — Z7901 Long term (current) use of anticoagulants: Secondary | ICD-10-CM | POA: Diagnosis not present

## 2024-04-25 DIAGNOSIS — K8020 Calculus of gallbladder without cholecystitis without obstruction: Secondary | ICD-10-CM | POA: Diagnosis not present

## 2024-04-29 DIAGNOSIS — I10 Essential (primary) hypertension: Secondary | ICD-10-CM | POA: Diagnosis not present

## 2024-04-29 DIAGNOSIS — E039 Hypothyroidism, unspecified: Secondary | ICD-10-CM | POA: Diagnosis not present

## 2024-04-29 DIAGNOSIS — N3000 Acute cystitis without hematuria: Secondary | ICD-10-CM | POA: Diagnosis not present

## 2024-04-29 DIAGNOSIS — K760 Fatty (change of) liver, not elsewhere classified: Secondary | ICD-10-CM | POA: Diagnosis not present

## 2024-04-29 DIAGNOSIS — K5730 Diverticulosis of large intestine without perforation or abscess without bleeding: Secondary | ICD-10-CM | POA: Diagnosis not present

## 2024-04-29 DIAGNOSIS — Z604 Social exclusion and rejection: Secondary | ICD-10-CM | POA: Diagnosis not present

## 2024-04-29 DIAGNOSIS — I700 Atherosclerosis of aorta: Secondary | ICD-10-CM | POA: Diagnosis not present

## 2024-04-29 DIAGNOSIS — K8020 Calculus of gallbladder without cholecystitis without obstruction: Secondary | ICD-10-CM | POA: Diagnosis not present

## 2024-04-29 DIAGNOSIS — Z86718 Personal history of other venous thrombosis and embolism: Secondary | ICD-10-CM | POA: Diagnosis not present

## 2024-04-29 DIAGNOSIS — Z86711 Personal history of pulmonary embolism: Secondary | ICD-10-CM | POA: Diagnosis not present

## 2024-04-29 DIAGNOSIS — Z7901 Long term (current) use of anticoagulants: Secondary | ICD-10-CM | POA: Diagnosis not present

## 2024-04-30 DIAGNOSIS — N3000 Acute cystitis without hematuria: Secondary | ICD-10-CM | POA: Diagnosis not present

## 2024-04-30 DIAGNOSIS — I700 Atherosclerosis of aorta: Secondary | ICD-10-CM | POA: Diagnosis not present

## 2024-04-30 DIAGNOSIS — K760 Fatty (change of) liver, not elsewhere classified: Secondary | ICD-10-CM | POA: Diagnosis not present

## 2024-04-30 DIAGNOSIS — E039 Hypothyroidism, unspecified: Secondary | ICD-10-CM | POA: Diagnosis not present

## 2024-05-01 DIAGNOSIS — Z7901 Long term (current) use of anticoagulants: Secondary | ICD-10-CM | POA: Diagnosis not present

## 2024-05-01 DIAGNOSIS — K5730 Diverticulosis of large intestine without perforation or abscess without bleeding: Secondary | ICD-10-CM | POA: Diagnosis not present

## 2024-05-01 DIAGNOSIS — K8020 Calculus of gallbladder without cholecystitis without obstruction: Secondary | ICD-10-CM | POA: Diagnosis not present

## 2024-05-01 DIAGNOSIS — E039 Hypothyroidism, unspecified: Secondary | ICD-10-CM | POA: Diagnosis not present

## 2024-05-01 DIAGNOSIS — Z604 Social exclusion and rejection: Secondary | ICD-10-CM | POA: Diagnosis not present

## 2024-05-01 DIAGNOSIS — I10 Essential (primary) hypertension: Secondary | ICD-10-CM | POA: Diagnosis not present

## 2024-05-01 DIAGNOSIS — Z86718 Personal history of other venous thrombosis and embolism: Secondary | ICD-10-CM | POA: Diagnosis not present

## 2024-05-01 DIAGNOSIS — I700 Atherosclerosis of aorta: Secondary | ICD-10-CM | POA: Diagnosis not present

## 2024-05-01 DIAGNOSIS — K760 Fatty (change of) liver, not elsewhere classified: Secondary | ICD-10-CM | POA: Diagnosis not present

## 2024-05-01 DIAGNOSIS — N3000 Acute cystitis without hematuria: Secondary | ICD-10-CM | POA: Diagnosis not present

## 2024-05-01 DIAGNOSIS — Z86711 Personal history of pulmonary embolism: Secondary | ICD-10-CM | POA: Diagnosis not present

## 2024-05-06 DIAGNOSIS — Z86711 Personal history of pulmonary embolism: Secondary | ICD-10-CM | POA: Diagnosis not present

## 2024-05-06 DIAGNOSIS — E039 Hypothyroidism, unspecified: Secondary | ICD-10-CM | POA: Diagnosis not present

## 2024-05-06 DIAGNOSIS — Z604 Social exclusion and rejection: Secondary | ICD-10-CM | POA: Diagnosis not present

## 2024-05-06 DIAGNOSIS — I700 Atherosclerosis of aorta: Secondary | ICD-10-CM | POA: Diagnosis not present

## 2024-05-06 DIAGNOSIS — N3000 Acute cystitis without hematuria: Secondary | ICD-10-CM | POA: Diagnosis not present

## 2024-05-06 DIAGNOSIS — K8020 Calculus of gallbladder without cholecystitis without obstruction: Secondary | ICD-10-CM | POA: Diagnosis not present

## 2024-05-06 DIAGNOSIS — Z86718 Personal history of other venous thrombosis and embolism: Secondary | ICD-10-CM | POA: Diagnosis not present

## 2024-05-06 DIAGNOSIS — I10 Essential (primary) hypertension: Secondary | ICD-10-CM | POA: Diagnosis not present

## 2024-05-06 DIAGNOSIS — K760 Fatty (change of) liver, not elsewhere classified: Secondary | ICD-10-CM | POA: Diagnosis not present

## 2024-05-06 DIAGNOSIS — K5730 Diverticulosis of large intestine without perforation or abscess without bleeding: Secondary | ICD-10-CM | POA: Diagnosis not present

## 2024-05-06 DIAGNOSIS — Z7901 Long term (current) use of anticoagulants: Secondary | ICD-10-CM | POA: Diagnosis not present

## 2024-05-08 DIAGNOSIS — I10 Essential (primary) hypertension: Secondary | ICD-10-CM | POA: Diagnosis not present

## 2024-05-08 DIAGNOSIS — K8020 Calculus of gallbladder without cholecystitis without obstruction: Secondary | ICD-10-CM | POA: Diagnosis not present

## 2024-05-08 DIAGNOSIS — K760 Fatty (change of) liver, not elsewhere classified: Secondary | ICD-10-CM | POA: Diagnosis not present

## 2024-05-08 DIAGNOSIS — Z604 Social exclusion and rejection: Secondary | ICD-10-CM | POA: Diagnosis not present

## 2024-05-08 DIAGNOSIS — I700 Atherosclerosis of aorta: Secondary | ICD-10-CM | POA: Diagnosis not present

## 2024-05-08 DIAGNOSIS — Z86718 Personal history of other venous thrombosis and embolism: Secondary | ICD-10-CM | POA: Diagnosis not present

## 2024-05-08 DIAGNOSIS — Z7901 Long term (current) use of anticoagulants: Secondary | ICD-10-CM | POA: Diagnosis not present

## 2024-05-08 DIAGNOSIS — Z86711 Personal history of pulmonary embolism: Secondary | ICD-10-CM | POA: Diagnosis not present

## 2024-05-08 DIAGNOSIS — K5730 Diverticulosis of large intestine without perforation or abscess without bleeding: Secondary | ICD-10-CM | POA: Diagnosis not present

## 2024-05-08 DIAGNOSIS — N3000 Acute cystitis without hematuria: Secondary | ICD-10-CM | POA: Diagnosis not present

## 2024-05-08 DIAGNOSIS — E039 Hypothyroidism, unspecified: Secondary | ICD-10-CM | POA: Diagnosis not present

## 2024-05-13 DIAGNOSIS — K760 Fatty (change of) liver, not elsewhere classified: Secondary | ICD-10-CM | POA: Diagnosis not present

## 2024-05-13 DIAGNOSIS — Z86718 Personal history of other venous thrombosis and embolism: Secondary | ICD-10-CM | POA: Diagnosis not present

## 2024-05-13 DIAGNOSIS — K5730 Diverticulosis of large intestine without perforation or abscess without bleeding: Secondary | ICD-10-CM | POA: Diagnosis not present

## 2024-05-13 DIAGNOSIS — I10 Essential (primary) hypertension: Secondary | ICD-10-CM | POA: Diagnosis not present

## 2024-05-13 DIAGNOSIS — Z7901 Long term (current) use of anticoagulants: Secondary | ICD-10-CM | POA: Diagnosis not present

## 2024-05-13 DIAGNOSIS — Z86711 Personal history of pulmonary embolism: Secondary | ICD-10-CM | POA: Diagnosis not present

## 2024-05-13 DIAGNOSIS — N3000 Acute cystitis without hematuria: Secondary | ICD-10-CM | POA: Diagnosis not present

## 2024-05-13 DIAGNOSIS — E039 Hypothyroidism, unspecified: Secondary | ICD-10-CM | POA: Diagnosis not present

## 2024-05-13 DIAGNOSIS — K8020 Calculus of gallbladder without cholecystitis without obstruction: Secondary | ICD-10-CM | POA: Diagnosis not present

## 2024-05-13 DIAGNOSIS — Z604 Social exclusion and rejection: Secondary | ICD-10-CM | POA: Diagnosis not present

## 2024-05-13 DIAGNOSIS — I700 Atherosclerosis of aorta: Secondary | ICD-10-CM | POA: Diagnosis not present

## 2024-05-16 DIAGNOSIS — K8020 Calculus of gallbladder without cholecystitis without obstruction: Secondary | ICD-10-CM | POA: Diagnosis not present

## 2024-05-16 DIAGNOSIS — Z604 Social exclusion and rejection: Secondary | ICD-10-CM | POA: Diagnosis not present

## 2024-05-16 DIAGNOSIS — K5730 Diverticulosis of large intestine without perforation or abscess without bleeding: Secondary | ICD-10-CM | POA: Diagnosis not present

## 2024-05-16 DIAGNOSIS — I700 Atherosclerosis of aorta: Secondary | ICD-10-CM | POA: Diagnosis not present

## 2024-05-16 DIAGNOSIS — Z86718 Personal history of other venous thrombosis and embolism: Secondary | ICD-10-CM | POA: Diagnosis not present

## 2024-05-16 DIAGNOSIS — Z86711 Personal history of pulmonary embolism: Secondary | ICD-10-CM | POA: Diagnosis not present

## 2024-05-16 DIAGNOSIS — Z7901 Long term (current) use of anticoagulants: Secondary | ICD-10-CM | POA: Diagnosis not present

## 2024-05-16 DIAGNOSIS — E039 Hypothyroidism, unspecified: Secondary | ICD-10-CM | POA: Diagnosis not present

## 2024-05-16 DIAGNOSIS — I10 Essential (primary) hypertension: Secondary | ICD-10-CM | POA: Diagnosis not present

## 2024-05-16 DIAGNOSIS — N3000 Acute cystitis without hematuria: Secondary | ICD-10-CM | POA: Diagnosis not present

## 2024-05-16 DIAGNOSIS — K760 Fatty (change of) liver, not elsewhere classified: Secondary | ICD-10-CM | POA: Diagnosis not present

## 2024-05-16 NOTE — Progress Notes (Signed)
 Remote pacemaker transmission.

## 2024-05-23 DIAGNOSIS — N3000 Acute cystitis without hematuria: Secondary | ICD-10-CM | POA: Diagnosis not present

## 2024-05-23 DIAGNOSIS — K760 Fatty (change of) liver, not elsewhere classified: Secondary | ICD-10-CM | POA: Diagnosis not present

## 2024-05-23 DIAGNOSIS — K8020 Calculus of gallbladder without cholecystitis without obstruction: Secondary | ICD-10-CM | POA: Diagnosis not present

## 2024-05-23 DIAGNOSIS — I10 Essential (primary) hypertension: Secondary | ICD-10-CM | POA: Diagnosis not present

## 2024-05-23 DIAGNOSIS — Z86711 Personal history of pulmonary embolism: Secondary | ICD-10-CM | POA: Diagnosis not present

## 2024-05-23 DIAGNOSIS — Z604 Social exclusion and rejection: Secondary | ICD-10-CM | POA: Diagnosis not present

## 2024-05-23 DIAGNOSIS — Z7901 Long term (current) use of anticoagulants: Secondary | ICD-10-CM | POA: Diagnosis not present

## 2024-05-23 DIAGNOSIS — K5730 Diverticulosis of large intestine without perforation or abscess without bleeding: Secondary | ICD-10-CM | POA: Diagnosis not present

## 2024-05-23 DIAGNOSIS — Z86718 Personal history of other venous thrombosis and embolism: Secondary | ICD-10-CM | POA: Diagnosis not present

## 2024-05-23 DIAGNOSIS — E039 Hypothyroidism, unspecified: Secondary | ICD-10-CM | POA: Diagnosis not present

## 2024-05-23 DIAGNOSIS — I700 Atherosclerosis of aorta: Secondary | ICD-10-CM | POA: Diagnosis not present

## 2024-05-24 DIAGNOSIS — Z604 Social exclusion and rejection: Secondary | ICD-10-CM | POA: Diagnosis not present

## 2024-05-24 DIAGNOSIS — N3000 Acute cystitis without hematuria: Secondary | ICD-10-CM | POA: Diagnosis not present

## 2024-05-24 DIAGNOSIS — K5730 Diverticulosis of large intestine without perforation or abscess without bleeding: Secondary | ICD-10-CM | POA: Diagnosis not present

## 2024-05-24 DIAGNOSIS — Z86718 Personal history of other venous thrombosis and embolism: Secondary | ICD-10-CM | POA: Diagnosis not present

## 2024-05-24 DIAGNOSIS — K760 Fatty (change of) liver, not elsewhere classified: Secondary | ICD-10-CM | POA: Diagnosis not present

## 2024-05-24 DIAGNOSIS — I700 Atherosclerosis of aorta: Secondary | ICD-10-CM | POA: Diagnosis not present

## 2024-05-24 DIAGNOSIS — Z7901 Long term (current) use of anticoagulants: Secondary | ICD-10-CM | POA: Diagnosis not present

## 2024-05-24 DIAGNOSIS — Z86711 Personal history of pulmonary embolism: Secondary | ICD-10-CM | POA: Diagnosis not present

## 2024-05-24 DIAGNOSIS — E039 Hypothyroidism, unspecified: Secondary | ICD-10-CM | POA: Diagnosis not present

## 2024-05-24 DIAGNOSIS — K8020 Calculus of gallbladder without cholecystitis without obstruction: Secondary | ICD-10-CM | POA: Diagnosis not present

## 2024-05-24 DIAGNOSIS — I10 Essential (primary) hypertension: Secondary | ICD-10-CM | POA: Diagnosis not present

## 2024-05-27 DIAGNOSIS — Z8719 Personal history of other diseases of the digestive system: Secondary | ICD-10-CM | POA: Diagnosis not present

## 2024-05-28 DIAGNOSIS — I10 Essential (primary) hypertension: Secondary | ICD-10-CM | POA: Diagnosis not present

## 2024-05-28 DIAGNOSIS — Z7901 Long term (current) use of anticoagulants: Secondary | ICD-10-CM | POA: Diagnosis not present

## 2024-05-28 DIAGNOSIS — K760 Fatty (change of) liver, not elsewhere classified: Secondary | ICD-10-CM | POA: Diagnosis not present

## 2024-05-28 DIAGNOSIS — Z86718 Personal history of other venous thrombosis and embolism: Secondary | ICD-10-CM | POA: Diagnosis not present

## 2024-05-28 DIAGNOSIS — Z604 Social exclusion and rejection: Secondary | ICD-10-CM | POA: Diagnosis not present

## 2024-05-28 DIAGNOSIS — K8020 Calculus of gallbladder without cholecystitis without obstruction: Secondary | ICD-10-CM | POA: Diagnosis not present

## 2024-05-28 DIAGNOSIS — N3000 Acute cystitis without hematuria: Secondary | ICD-10-CM | POA: Diagnosis not present

## 2024-05-28 DIAGNOSIS — I700 Atherosclerosis of aorta: Secondary | ICD-10-CM | POA: Diagnosis not present

## 2024-05-28 DIAGNOSIS — K5730 Diverticulosis of large intestine without perforation or abscess without bleeding: Secondary | ICD-10-CM | POA: Diagnosis not present

## 2024-05-28 DIAGNOSIS — Z86711 Personal history of pulmonary embolism: Secondary | ICD-10-CM | POA: Diagnosis not present

## 2024-05-28 DIAGNOSIS — E039 Hypothyroidism, unspecified: Secondary | ICD-10-CM | POA: Diagnosis not present

## 2024-06-03 DIAGNOSIS — Z86718 Personal history of other venous thrombosis and embolism: Secondary | ICD-10-CM | POA: Diagnosis not present

## 2024-06-03 DIAGNOSIS — K8020 Calculus of gallbladder without cholecystitis without obstruction: Secondary | ICD-10-CM | POA: Diagnosis not present

## 2024-06-03 DIAGNOSIS — Z86711 Personal history of pulmonary embolism: Secondary | ICD-10-CM | POA: Diagnosis not present

## 2024-06-03 DIAGNOSIS — E039 Hypothyroidism, unspecified: Secondary | ICD-10-CM | POA: Diagnosis not present

## 2024-06-03 DIAGNOSIS — K760 Fatty (change of) liver, not elsewhere classified: Secondary | ICD-10-CM | POA: Diagnosis not present

## 2024-06-03 DIAGNOSIS — Z604 Social exclusion and rejection: Secondary | ICD-10-CM | POA: Diagnosis not present

## 2024-06-03 DIAGNOSIS — I10 Essential (primary) hypertension: Secondary | ICD-10-CM | POA: Diagnosis not present

## 2024-06-03 DIAGNOSIS — K5730 Diverticulosis of large intestine without perforation or abscess without bleeding: Secondary | ICD-10-CM | POA: Diagnosis not present

## 2024-06-03 DIAGNOSIS — Z7901 Long term (current) use of anticoagulants: Secondary | ICD-10-CM | POA: Diagnosis not present

## 2024-06-03 DIAGNOSIS — I700 Atherosclerosis of aorta: Secondary | ICD-10-CM | POA: Diagnosis not present

## 2024-06-03 DIAGNOSIS — N3000 Acute cystitis without hematuria: Secondary | ICD-10-CM | POA: Diagnosis not present

## 2024-06-04 DIAGNOSIS — K8020 Calculus of gallbladder without cholecystitis without obstruction: Secondary | ICD-10-CM | POA: Diagnosis not present

## 2024-06-04 DIAGNOSIS — I10 Essential (primary) hypertension: Secondary | ICD-10-CM | POA: Diagnosis not present

## 2024-06-04 DIAGNOSIS — K5730 Diverticulosis of large intestine without perforation or abscess without bleeding: Secondary | ICD-10-CM | POA: Diagnosis not present

## 2024-06-04 DIAGNOSIS — N3000 Acute cystitis without hematuria: Secondary | ICD-10-CM | POA: Diagnosis not present

## 2024-06-04 DIAGNOSIS — I700 Atherosclerosis of aorta: Secondary | ICD-10-CM | POA: Diagnosis not present

## 2024-06-04 DIAGNOSIS — Z86711 Personal history of pulmonary embolism: Secondary | ICD-10-CM | POA: Diagnosis not present

## 2024-06-04 DIAGNOSIS — E039 Hypothyroidism, unspecified: Secondary | ICD-10-CM | POA: Diagnosis not present

## 2024-06-04 DIAGNOSIS — Z7901 Long term (current) use of anticoagulants: Secondary | ICD-10-CM | POA: Diagnosis not present

## 2024-06-04 DIAGNOSIS — Z86718 Personal history of other venous thrombosis and embolism: Secondary | ICD-10-CM | POA: Diagnosis not present

## 2024-06-04 DIAGNOSIS — Z604 Social exclusion and rejection: Secondary | ICD-10-CM | POA: Diagnosis not present

## 2024-06-04 DIAGNOSIS — K760 Fatty (change of) liver, not elsewhere classified: Secondary | ICD-10-CM | POA: Diagnosis not present

## 2024-06-10 DIAGNOSIS — I10 Essential (primary) hypertension: Secondary | ICD-10-CM | POA: Diagnosis not present

## 2024-06-10 DIAGNOSIS — Z7901 Long term (current) use of anticoagulants: Secondary | ICD-10-CM | POA: Diagnosis not present

## 2024-06-10 DIAGNOSIS — E039 Hypothyroidism, unspecified: Secondary | ICD-10-CM | POA: Diagnosis not present

## 2024-06-10 DIAGNOSIS — K8020 Calculus of gallbladder without cholecystitis without obstruction: Secondary | ICD-10-CM | POA: Diagnosis not present

## 2024-06-10 DIAGNOSIS — N3000 Acute cystitis without hematuria: Secondary | ICD-10-CM | POA: Diagnosis not present

## 2024-06-10 DIAGNOSIS — K5730 Diverticulosis of large intestine without perforation or abscess without bleeding: Secondary | ICD-10-CM | POA: Diagnosis not present

## 2024-06-10 DIAGNOSIS — Z86711 Personal history of pulmonary embolism: Secondary | ICD-10-CM | POA: Diagnosis not present

## 2024-06-10 DIAGNOSIS — K760 Fatty (change of) liver, not elsewhere classified: Secondary | ICD-10-CM | POA: Diagnosis not present

## 2024-06-10 DIAGNOSIS — Z604 Social exclusion and rejection: Secondary | ICD-10-CM | POA: Diagnosis not present

## 2024-06-10 DIAGNOSIS — I700 Atherosclerosis of aorta: Secondary | ICD-10-CM | POA: Diagnosis not present

## 2024-06-10 DIAGNOSIS — Z86718 Personal history of other venous thrombosis and embolism: Secondary | ICD-10-CM | POA: Diagnosis not present

## 2024-06-14 DIAGNOSIS — K5730 Diverticulosis of large intestine without perforation or abscess without bleeding: Secondary | ICD-10-CM | POA: Diagnosis not present

## 2024-06-14 DIAGNOSIS — I700 Atherosclerosis of aorta: Secondary | ICD-10-CM | POA: Diagnosis not present

## 2024-06-14 DIAGNOSIS — Z86718 Personal history of other venous thrombosis and embolism: Secondary | ICD-10-CM | POA: Diagnosis not present

## 2024-06-14 DIAGNOSIS — Z604 Social exclusion and rejection: Secondary | ICD-10-CM | POA: Diagnosis not present

## 2024-06-14 DIAGNOSIS — I10 Essential (primary) hypertension: Secondary | ICD-10-CM | POA: Diagnosis not present

## 2024-06-14 DIAGNOSIS — Z7901 Long term (current) use of anticoagulants: Secondary | ICD-10-CM | POA: Diagnosis not present

## 2024-06-14 DIAGNOSIS — N3000 Acute cystitis without hematuria: Secondary | ICD-10-CM | POA: Diagnosis not present

## 2024-06-14 DIAGNOSIS — Z86711 Personal history of pulmonary embolism: Secondary | ICD-10-CM | POA: Diagnosis not present

## 2024-06-14 DIAGNOSIS — K760 Fatty (change of) liver, not elsewhere classified: Secondary | ICD-10-CM | POA: Diagnosis not present

## 2024-06-14 DIAGNOSIS — K8020 Calculus of gallbladder without cholecystitis without obstruction: Secondary | ICD-10-CM | POA: Diagnosis not present

## 2024-06-14 DIAGNOSIS — E039 Hypothyroidism, unspecified: Secondary | ICD-10-CM | POA: Diagnosis not present

## 2024-06-15 DIAGNOSIS — K8020 Calculus of gallbladder without cholecystitis without obstruction: Secondary | ICD-10-CM | POA: Diagnosis not present

## 2024-06-15 DIAGNOSIS — K760 Fatty (change of) liver, not elsewhere classified: Secondary | ICD-10-CM | POA: Diagnosis not present

## 2024-06-15 DIAGNOSIS — Z7901 Long term (current) use of anticoagulants: Secondary | ICD-10-CM | POA: Diagnosis not present

## 2024-06-15 DIAGNOSIS — Z86718 Personal history of other venous thrombosis and embolism: Secondary | ICD-10-CM | POA: Diagnosis not present

## 2024-06-15 DIAGNOSIS — K5730 Diverticulosis of large intestine without perforation or abscess without bleeding: Secondary | ICD-10-CM | POA: Diagnosis not present

## 2024-06-15 DIAGNOSIS — Z604 Social exclusion and rejection: Secondary | ICD-10-CM | POA: Diagnosis not present

## 2024-06-15 DIAGNOSIS — I10 Essential (primary) hypertension: Secondary | ICD-10-CM | POA: Diagnosis not present

## 2024-06-15 DIAGNOSIS — N3000 Acute cystitis without hematuria: Secondary | ICD-10-CM | POA: Diagnosis not present

## 2024-06-15 DIAGNOSIS — I700 Atherosclerosis of aorta: Secondary | ICD-10-CM | POA: Diagnosis not present

## 2024-06-15 DIAGNOSIS — Z86711 Personal history of pulmonary embolism: Secondary | ICD-10-CM | POA: Diagnosis not present

## 2024-06-17 DIAGNOSIS — K8020 Calculus of gallbladder without cholecystitis without obstruction: Secondary | ICD-10-CM | POA: Diagnosis not present

## 2024-06-17 DIAGNOSIS — I700 Atherosclerosis of aorta: Secondary | ICD-10-CM | POA: Diagnosis not present

## 2024-06-17 DIAGNOSIS — Z7901 Long term (current) use of anticoagulants: Secondary | ICD-10-CM | POA: Diagnosis not present

## 2024-06-17 DIAGNOSIS — Z86718 Personal history of other venous thrombosis and embolism: Secondary | ICD-10-CM | POA: Diagnosis not present

## 2024-06-17 DIAGNOSIS — I10 Essential (primary) hypertension: Secondary | ICD-10-CM | POA: Diagnosis not present

## 2024-06-17 DIAGNOSIS — E039 Hypothyroidism, unspecified: Secondary | ICD-10-CM | POA: Diagnosis not present

## 2024-06-17 DIAGNOSIS — K760 Fatty (change of) liver, not elsewhere classified: Secondary | ICD-10-CM | POA: Diagnosis not present

## 2024-06-17 DIAGNOSIS — Z604 Social exclusion and rejection: Secondary | ICD-10-CM | POA: Diagnosis not present

## 2024-06-17 DIAGNOSIS — N3000 Acute cystitis without hematuria: Secondary | ICD-10-CM | POA: Diagnosis not present

## 2024-06-17 DIAGNOSIS — Z86711 Personal history of pulmonary embolism: Secondary | ICD-10-CM | POA: Diagnosis not present

## 2024-06-17 DIAGNOSIS — K5730 Diverticulosis of large intestine without perforation or abscess without bleeding: Secondary | ICD-10-CM | POA: Diagnosis not present

## 2024-06-18 DIAGNOSIS — K760 Fatty (change of) liver, not elsewhere classified: Secondary | ICD-10-CM | POA: Diagnosis not present

## 2024-06-18 DIAGNOSIS — I700 Atherosclerosis of aorta: Secondary | ICD-10-CM | POA: Diagnosis not present

## 2024-06-18 DIAGNOSIS — K8020 Calculus of gallbladder without cholecystitis without obstruction: Secondary | ICD-10-CM | POA: Diagnosis not present

## 2024-06-18 DIAGNOSIS — E039 Hypothyroidism, unspecified: Secondary | ICD-10-CM | POA: Diagnosis not present

## 2024-06-25 DIAGNOSIS — H9209 Otalgia, unspecified ear: Secondary | ICD-10-CM | POA: Diagnosis not present

## 2024-06-25 DIAGNOSIS — H6120 Impacted cerumen, unspecified ear: Secondary | ICD-10-CM | POA: Diagnosis not present

## 2024-06-27 DIAGNOSIS — E039 Hypothyroidism, unspecified: Secondary | ICD-10-CM | POA: Diagnosis not present

## 2024-06-27 DIAGNOSIS — Z86718 Personal history of other venous thrombosis and embolism: Secondary | ICD-10-CM | POA: Diagnosis not present

## 2024-06-27 DIAGNOSIS — Z7901 Long term (current) use of anticoagulants: Secondary | ICD-10-CM | POA: Diagnosis not present

## 2024-06-27 DIAGNOSIS — I10 Essential (primary) hypertension: Secondary | ICD-10-CM | POA: Diagnosis not present

## 2024-06-27 DIAGNOSIS — K8020 Calculus of gallbladder without cholecystitis without obstruction: Secondary | ICD-10-CM | POA: Diagnosis not present

## 2024-06-27 DIAGNOSIS — K5730 Diverticulosis of large intestine without perforation or abscess without bleeding: Secondary | ICD-10-CM | POA: Diagnosis not present

## 2024-06-27 DIAGNOSIS — N3000 Acute cystitis without hematuria: Secondary | ICD-10-CM | POA: Diagnosis not present

## 2024-06-27 DIAGNOSIS — Z86711 Personal history of pulmonary embolism: Secondary | ICD-10-CM | POA: Diagnosis not present

## 2024-06-27 DIAGNOSIS — Z604 Social exclusion and rejection: Secondary | ICD-10-CM | POA: Diagnosis not present

## 2024-06-27 DIAGNOSIS — K760 Fatty (change of) liver, not elsewhere classified: Secondary | ICD-10-CM | POA: Diagnosis not present

## 2024-06-27 DIAGNOSIS — I700 Atherosclerosis of aorta: Secondary | ICD-10-CM | POA: Diagnosis not present

## 2024-06-28 DIAGNOSIS — K8020 Calculus of gallbladder without cholecystitis without obstruction: Secondary | ICD-10-CM | POA: Diagnosis not present

## 2024-06-28 DIAGNOSIS — K760 Fatty (change of) liver, not elsewhere classified: Secondary | ICD-10-CM | POA: Diagnosis not present

## 2024-06-28 DIAGNOSIS — I700 Atherosclerosis of aorta: Secondary | ICD-10-CM | POA: Diagnosis not present

## 2024-06-28 DIAGNOSIS — Z86711 Personal history of pulmonary embolism: Secondary | ICD-10-CM | POA: Diagnosis not present

## 2024-06-28 DIAGNOSIS — E039 Hypothyroidism, unspecified: Secondary | ICD-10-CM | POA: Diagnosis not present

## 2024-06-28 DIAGNOSIS — I10 Essential (primary) hypertension: Secondary | ICD-10-CM | POA: Diagnosis not present

## 2024-06-28 DIAGNOSIS — N3000 Acute cystitis without hematuria: Secondary | ICD-10-CM | POA: Diagnosis not present

## 2024-06-28 DIAGNOSIS — K5730 Diverticulosis of large intestine without perforation or abscess without bleeding: Secondary | ICD-10-CM | POA: Diagnosis not present

## 2024-06-28 DIAGNOSIS — Z7901 Long term (current) use of anticoagulants: Secondary | ICD-10-CM | POA: Diagnosis not present

## 2024-06-28 DIAGNOSIS — Z86718 Personal history of other venous thrombosis and embolism: Secondary | ICD-10-CM | POA: Diagnosis not present

## 2024-06-28 DIAGNOSIS — Z604 Social exclusion and rejection: Secondary | ICD-10-CM | POA: Diagnosis not present

## 2024-07-02 ENCOUNTER — Ambulatory Visit (INDEPENDENT_AMBULATORY_CARE_PROVIDER_SITE_OTHER): Admitting: Physician Assistant

## 2024-07-02 VITALS — BP 133/73 | HR 81

## 2024-07-02 DIAGNOSIS — H6123 Impacted cerumen, bilateral: Secondary | ICD-10-CM

## 2024-07-02 DIAGNOSIS — H9193 Unspecified hearing loss, bilateral: Secondary | ICD-10-CM

## 2024-07-02 NOTE — Progress Notes (Signed)
 Dear Dr. Gerome, Here is my assessment for our mutual [name], [name]. Thank you for allowing me Crystal opportunity to care for your [name]. Please do not hesitate to contact me should you have any other questions. Sincerely, Chyrl Cohen PA-C  Otolaryngology Clinic Note Referring provider: Dr. Gerome HPI:  [name] is Crystal 89 y.o. [name] kindly referred by Dr. Gerome   [name] is Crystal 89 year old [name] seen in our office today for evaluation of cerumen impaction.  [name] notes that she has had difficulty hearing that has progressed over time.  1 week ago she saw her primary care provider who noted bilateral cerumen.  She had this flushed out but noted significant pain afterwards predominantly on Crystal left ear.  She notes since that time Crystal symptoms have improved.  She denies any drainage, no ongoing pain.  She notes persistent trouble hearing.    Independent Review of Additional Tests or Records:  None   PMH/Meds/All/SocHx/FamHx/ROS:   Past Medical History:  Diagnosis Date   Central retinal artery occlusion of right eye 2015   lost complete vision   Edema    Hypertension    not on BP medication currently   Neuropathy    Post-surgical hypothyroidism    Pulmonary emboli (HCC)    VTE (venous thromboembolism) 12/2018   LLE DVT and PE     Past Surgical History:  Procedure Laterality Date   COLONOSCOPY  2019   PACEMAKER IMPLANT N/Crystal 01/11/2021   Procedure: PACEMAKER IMPLANT;  Surgeon: Waddell Danelle ORN, MD;  Location: [facility] INVASIVE [facility];  Service: Cardiovascular;  Laterality: N/Crystal;   REPLACEMENT TOTAL KNEE Right 2008   TOTAL THYROIDECTOMY      No family history on file.   Social Connections: Not on file      Current Outpatient Medications:    ELIQUIS  5 MG TABS tablet, Take 1 tablet (5 mg total) by mouth 2 (two) times daily., Disp: 60 tablet, Rfl:    furosemide  (LASIX ) 20 MG tablet, Take 20 mg by mouth daily., Disp: , Rfl:    levothyroxine  (SYNTHROID ) 75  MCG tablet, Take 75 mcg by mouth daily before breakfast., Disp: , Rfl:    magnesium  hydroxide (MILK OF MAGNESIA) 400 MG/5ML suspension, Take 15-30 mLs by mouth daily as needed for mild constipation., Disp: , Rfl:    meclizine  (ANTIVERT ) 25 MG tablet, Take 25 mg by mouth as needed for dizziness., Disp: , Rfl:    mirtazapine  (REMERON ) 15 MG tablet, Take 15 mg by mouth at bedtime as needed (depression)., Disp: , Rfl:    omeprazole (PRILOSEC) 20 MG capsule, Take 20 mg by mouth as needed (acid reflux)., Disp: , Rfl:    polyvinyl alcohol  (LIQUIFILM TEARS) 1.4 % ophthalmic solution, Place 1 drop into both eyes as needed for dry eyes. ([name] taking differently: Place 1 drop into Crystal left eye as needed (for blurry vision).), Disp: 15 mL, Rfl: 0   pregabalin  (LYRICA ) 75 MG capsule, Take 1 capsule (75 mg total) by mouth 2 (two) times daily., Disp: 60 capsule, Rfl: 0   isosorbide  mononitrate (IMDUR ) 30 MG 24 hr tablet, Take 0.5 tablets (15 mg total) by mouth daily. ([name] not taking: Reported on 07/02/2024), Disp: 45 tablet, Rfl: 3   metoprolol  succinate (TOPROL  XL) 25 MG 24 hr tablet, Take 0.5 tablets (12.5 mg total) by mouth daily., Disp: 45 tablet, Rfl: 3   NON FORMULARY, Take 30-60 mLs by mouth See admin instructions. Prune juice- Drink 30-60 ml's by mouth as  needed for constipation ([name] not taking: Reported on 07/02/2024), Disp: , Rfl:    traMADol  (ULTRAM ) 50 MG tablet, Take 50 mg by mouth as needed for moderate pain (pain score 4-6) or severe pain (pain score 7-10). ([name] not taking: Reported on 07/02/2024), Disp: , Rfl:    Physical Exam:   BP 133/73   Pulse 81   SpO2 97%   Pertinent Findings  CN II-XII intact Bilateral external auditory canal with cerumen impaction ; sp removal TMs intact with well-pneumatized middle ear space, slight irritation of Crystal left middle inferior auditory canal, no signs of infection Weber 512: equal Rinne 512: AC > BC b/l  No lesions of oral cavity/oropharynx;  dentures No obvious  neck masses/lymphadenopathy/thyromegaly No respiratory distress or stridor  Seprately Identifiable Procedures:  Procedure: Bilateral ear microscopy and cerumen removal using microscope (CPT [phone]) - Mod 50 Pre-procedure diagnosis: bilateral cerumen impaction external auditory canals Post-procedure diagnosis: same Indication: bilateral cerumen impaction; given [name]'s otologic complaints and history as well as for improved and comprehensive examination of external ear and tympanic membrane, bilateral otologic examination using microscope was performed and impacted cerumen removed  Procedure: [name] was placed semi-recumbent. Both ear canals were examined using Crystal microscope with findings above. Cerumen removed from bilateral external auditory canals using suction and currette with improvement in EAC examination and patency. Left: EAC was patent. TM was intact . Middle ear was aerated. Drainage: none Right: EAC was patent. TM was intact . Middle ear was aerated . Drainage: none [name] tolerated Crystal procedure well.   Impression & Plans:  [name] is Crystal 89 y.o. [name] with Crystal following   Decreased hearing-  Wax removed today.  [name] still has some decreased hearing.  I would recommend audiological evaluation if she has persistent decreased hearing or any concerns.  [name] would like to wait at this time.  Return precautions given.  She verbalized understanding and agreement to today's plan.   - f/u audiological evaluation   Thank you for allowing me Crystal opportunity to care for your [name]. Please do not hesitate to contact me should you have any other questions.  Sincerely, Chyrl Cohen PA-C [facility] Phone: [phone] Fax: [phone]  07/02/2024, 11:14 AM

## 2024-07-03 ENCOUNTER — Institutional Professional Consult (permissible substitution) (INDEPENDENT_AMBULATORY_CARE_PROVIDER_SITE_OTHER): Admitting: Physician Assistant

## 2024-07-05 DIAGNOSIS — K760 Fatty (change of) liver, not elsewhere classified: Secondary | ICD-10-CM | POA: Diagnosis not present

## 2024-07-05 DIAGNOSIS — Z86718 Personal history of other venous thrombosis and embolism: Secondary | ICD-10-CM | POA: Diagnosis not present

## 2024-07-05 DIAGNOSIS — I10 Essential (primary) hypertension: Secondary | ICD-10-CM | POA: Diagnosis not present

## 2024-07-05 DIAGNOSIS — I700 Atherosclerosis of aorta: Secondary | ICD-10-CM | POA: Diagnosis not present

## 2024-07-05 DIAGNOSIS — Z7901 Long term (current) use of anticoagulants: Secondary | ICD-10-CM | POA: Diagnosis not present

## 2024-07-05 DIAGNOSIS — K8020 Calculus of gallbladder without cholecystitis without obstruction: Secondary | ICD-10-CM | POA: Diagnosis not present

## 2024-07-05 DIAGNOSIS — N3000 Acute cystitis without hematuria: Secondary | ICD-10-CM | POA: Diagnosis not present

## 2024-07-05 DIAGNOSIS — E039 Hypothyroidism, unspecified: Secondary | ICD-10-CM | POA: Diagnosis not present

## 2024-07-05 DIAGNOSIS — Z86711 Personal history of pulmonary embolism: Secondary | ICD-10-CM | POA: Diagnosis not present

## 2024-07-05 DIAGNOSIS — Z604 Social exclusion and rejection: Secondary | ICD-10-CM | POA: Diagnosis not present

## 2024-07-05 DIAGNOSIS — K5730 Diverticulosis of large intestine without perforation or abscess without bleeding: Secondary | ICD-10-CM | POA: Diagnosis not present

## 2024-07-08 ENCOUNTER — Ambulatory Visit (INDEPENDENT_AMBULATORY_CARE_PROVIDER_SITE_OTHER): Payer: Medicare Other

## 2024-07-08 DIAGNOSIS — I4430 Unspecified atrioventricular block: Secondary | ICD-10-CM | POA: Diagnosis not present

## 2024-07-08 DIAGNOSIS — K8020 Calculus of gallbladder without cholecystitis without obstruction: Secondary | ICD-10-CM | POA: Diagnosis not present

## 2024-07-08 DIAGNOSIS — I10 Essential (primary) hypertension: Secondary | ICD-10-CM | POA: Diagnosis not present

## 2024-07-08 DIAGNOSIS — I700 Atherosclerosis of aorta: Secondary | ICD-10-CM | POA: Diagnosis not present

## 2024-07-08 DIAGNOSIS — N3000 Acute cystitis without hematuria: Secondary | ICD-10-CM | POA: Diagnosis not present

## 2024-07-08 DIAGNOSIS — Z86711 Personal history of pulmonary embolism: Secondary | ICD-10-CM | POA: Diagnosis not present

## 2024-07-08 DIAGNOSIS — K760 Fatty (change of) liver, not elsewhere classified: Secondary | ICD-10-CM | POA: Diagnosis not present

## 2024-07-08 DIAGNOSIS — Z604 Social exclusion and rejection: Secondary | ICD-10-CM | POA: Diagnosis not present

## 2024-07-08 DIAGNOSIS — E039 Hypothyroidism, unspecified: Secondary | ICD-10-CM | POA: Diagnosis not present

## 2024-07-08 DIAGNOSIS — K5730 Diverticulosis of large intestine without perforation or abscess without bleeding: Secondary | ICD-10-CM | POA: Diagnosis not present

## 2024-07-08 DIAGNOSIS — Z7901 Long term (current) use of anticoagulants: Secondary | ICD-10-CM | POA: Diagnosis not present

## 2024-07-08 DIAGNOSIS — Z86718 Personal history of other venous thrombosis and embolism: Secondary | ICD-10-CM | POA: Diagnosis not present

## 2024-07-09 DIAGNOSIS — E039 Hypothyroidism, unspecified: Secondary | ICD-10-CM | POA: Diagnosis not present

## 2024-07-09 DIAGNOSIS — Z7901 Long term (current) use of anticoagulants: Secondary | ICD-10-CM | POA: Diagnosis not present

## 2024-07-09 DIAGNOSIS — K760 Fatty (change of) liver, not elsewhere classified: Secondary | ICD-10-CM | POA: Diagnosis not present

## 2024-07-09 DIAGNOSIS — K8020 Calculus of gallbladder without cholecystitis without obstruction: Secondary | ICD-10-CM | POA: Diagnosis not present

## 2024-07-09 DIAGNOSIS — I10 Essential (primary) hypertension: Secondary | ICD-10-CM | POA: Diagnosis not present

## 2024-07-09 DIAGNOSIS — Z604 Social exclusion and rejection: Secondary | ICD-10-CM | POA: Diagnosis not present

## 2024-07-09 DIAGNOSIS — N3000 Acute cystitis without hematuria: Secondary | ICD-10-CM | POA: Diagnosis not present

## 2024-07-09 DIAGNOSIS — I700 Atherosclerosis of aorta: Secondary | ICD-10-CM | POA: Diagnosis not present

## 2024-07-09 DIAGNOSIS — Z86718 Personal history of other venous thrombosis and embolism: Secondary | ICD-10-CM | POA: Diagnosis not present

## 2024-07-09 DIAGNOSIS — K5730 Diverticulosis of large intestine without perforation or abscess without bleeding: Secondary | ICD-10-CM | POA: Diagnosis not present

## 2024-07-09 DIAGNOSIS — Z86711 Personal history of pulmonary embolism: Secondary | ICD-10-CM | POA: Diagnosis not present

## 2024-07-09 LAB — CUP PACEART REMOTE DEVICE CHECK
Battery Remaining Longevity: 96 mo
Battery Remaining Percentage: 76 %
Battery Voltage: 3.02 V
Brady Statistic AP VP Percent: 1 %
Brady Statistic AP VS Percent: 1.6 %
Brady Statistic AS VP Percent: 0 %
Brady Statistic AS VS Percent: 98 %
Brady Statistic RA Percent Paced: 1.4 %
Brady Statistic RV Percent Paced: 1 %
Date Time Interrogation Session: 20250915020025
Implantable Lead Connection Status: 753985
Implantable Lead Connection Status: 753985
Implantable Lead Implant Date: 20220321
Implantable Lead Implant Date: 20220321
Implantable Lead Location: 753859
Implantable Lead Location: 753860
Implantable Pulse Generator Implant Date: 20220321
Lead Channel Impedance Value: 440 Ohm
Lead Channel Impedance Value: 510 Ohm
Lead Channel Pacing Threshold Amplitude: 0.75 V
Lead Channel Pacing Threshold Amplitude: 0.75 V
Lead Channel Pacing Threshold Pulse Width: 0.4 ms
Lead Channel Pacing Threshold Pulse Width: 0.4 ms
Lead Channel Sensing Intrinsic Amplitude: 12 mV
Lead Channel Sensing Intrinsic Amplitude: 3.3 mV
Lead Channel Setting Pacing Amplitude: 2 V
Lead Channel Setting Pacing Amplitude: 2.5 V
Lead Channel Setting Pacing Pulse Width: 0.4 ms
Lead Channel Setting Sensing Sensitivity: 2 mV
Pulse Gen Model: 2272
Pulse Gen Serial Number: 3910619

## 2024-07-12 ENCOUNTER — Ambulatory Visit: Payer: Self-pay | Admitting: Internal Medicine

## 2024-07-13 NOTE — Progress Notes (Signed)
 Remote PPM Transmission

## 2024-07-15 DIAGNOSIS — Z7901 Long term (current) use of anticoagulants: Secondary | ICD-10-CM | POA: Diagnosis not present

## 2024-07-15 DIAGNOSIS — K5730 Diverticulosis of large intestine without perforation or abscess without bleeding: Secondary | ICD-10-CM | POA: Diagnosis not present

## 2024-07-15 DIAGNOSIS — Z86718 Personal history of other venous thrombosis and embolism: Secondary | ICD-10-CM | POA: Diagnosis not present

## 2024-07-15 DIAGNOSIS — Z86711 Personal history of pulmonary embolism: Secondary | ICD-10-CM | POA: Diagnosis not present

## 2024-07-15 DIAGNOSIS — I10 Essential (primary) hypertension: Secondary | ICD-10-CM | POA: Diagnosis not present

## 2024-07-15 DIAGNOSIS — K8020 Calculus of gallbladder without cholecystitis without obstruction: Secondary | ICD-10-CM | POA: Diagnosis not present

## 2024-07-15 DIAGNOSIS — Z604 Social exclusion and rejection: Secondary | ICD-10-CM | POA: Diagnosis not present

## 2024-07-15 DIAGNOSIS — I700 Atherosclerosis of aorta: Secondary | ICD-10-CM | POA: Diagnosis not present

## 2024-07-15 DIAGNOSIS — N3000 Acute cystitis without hematuria: Secondary | ICD-10-CM | POA: Diagnosis not present

## 2024-07-15 DIAGNOSIS — K760 Fatty (change of) liver, not elsewhere classified: Secondary | ICD-10-CM | POA: Diagnosis not present

## 2024-07-18 DIAGNOSIS — I700 Atherosclerosis of aorta: Secondary | ICD-10-CM | POA: Diagnosis not present

## 2024-07-18 DIAGNOSIS — Z604 Social exclusion and rejection: Secondary | ICD-10-CM | POA: Diagnosis not present

## 2024-07-18 DIAGNOSIS — K5730 Diverticulosis of large intestine without perforation or abscess without bleeding: Secondary | ICD-10-CM | POA: Diagnosis not present

## 2024-07-18 DIAGNOSIS — N3000 Acute cystitis without hematuria: Secondary | ICD-10-CM | POA: Diagnosis not present

## 2024-07-18 DIAGNOSIS — Z86718 Personal history of other venous thrombosis and embolism: Secondary | ICD-10-CM | POA: Diagnosis not present

## 2024-07-18 DIAGNOSIS — K8020 Calculus of gallbladder without cholecystitis without obstruction: Secondary | ICD-10-CM | POA: Diagnosis not present

## 2024-07-18 DIAGNOSIS — Z7901 Long term (current) use of anticoagulants: Secondary | ICD-10-CM | POA: Diagnosis not present

## 2024-07-18 DIAGNOSIS — E039 Hypothyroidism, unspecified: Secondary | ICD-10-CM | POA: Diagnosis not present

## 2024-07-18 DIAGNOSIS — K760 Fatty (change of) liver, not elsewhere classified: Secondary | ICD-10-CM | POA: Diagnosis not present

## 2024-07-18 DIAGNOSIS — Z86711 Personal history of pulmonary embolism: Secondary | ICD-10-CM | POA: Diagnosis not present

## 2024-07-18 DIAGNOSIS — I10 Essential (primary) hypertension: Secondary | ICD-10-CM | POA: Diagnosis not present

## 2024-07-23 DIAGNOSIS — Z7901 Long term (current) use of anticoagulants: Secondary | ICD-10-CM | POA: Diagnosis not present

## 2024-07-23 DIAGNOSIS — I700 Atherosclerosis of aorta: Secondary | ICD-10-CM | POA: Diagnosis not present

## 2024-07-23 DIAGNOSIS — K5730 Diverticulosis of large intestine without perforation or abscess without bleeding: Secondary | ICD-10-CM | POA: Diagnosis not present

## 2024-07-23 DIAGNOSIS — N3000 Acute cystitis without hematuria: Secondary | ICD-10-CM | POA: Diagnosis not present

## 2024-07-23 DIAGNOSIS — E039 Hypothyroidism, unspecified: Secondary | ICD-10-CM | POA: Diagnosis not present

## 2024-07-23 DIAGNOSIS — I10 Essential (primary) hypertension: Secondary | ICD-10-CM | POA: Diagnosis not present

## 2024-07-23 DIAGNOSIS — K760 Fatty (change of) liver, not elsewhere classified: Secondary | ICD-10-CM | POA: Diagnosis not present

## 2024-07-23 DIAGNOSIS — K8020 Calculus of gallbladder without cholecystitis without obstruction: Secondary | ICD-10-CM | POA: Diagnosis not present

## 2024-07-23 DIAGNOSIS — Z86718 Personal history of other venous thrombosis and embolism: Secondary | ICD-10-CM | POA: Diagnosis not present

## 2024-07-23 DIAGNOSIS — Z604 Social exclusion and rejection: Secondary | ICD-10-CM | POA: Diagnosis not present

## 2024-07-23 DIAGNOSIS — Z86711 Personal history of pulmonary embolism: Secondary | ICD-10-CM | POA: Diagnosis not present

## 2024-07-30 DIAGNOSIS — I700 Atherosclerosis of aorta: Secondary | ICD-10-CM | POA: Diagnosis not present

## 2024-07-30 DIAGNOSIS — Z604 Social exclusion and rejection: Secondary | ICD-10-CM | POA: Diagnosis not present

## 2024-07-30 DIAGNOSIS — Z7901 Long term (current) use of anticoagulants: Secondary | ICD-10-CM | POA: Diagnosis not present

## 2024-07-30 DIAGNOSIS — I10 Essential (primary) hypertension: Secondary | ICD-10-CM | POA: Diagnosis not present

## 2024-07-30 DIAGNOSIS — K760 Fatty (change of) liver, not elsewhere classified: Secondary | ICD-10-CM | POA: Diagnosis not present

## 2024-07-30 DIAGNOSIS — N3000 Acute cystitis without hematuria: Secondary | ICD-10-CM | POA: Diagnosis not present

## 2024-07-30 DIAGNOSIS — Z86711 Personal history of pulmonary embolism: Secondary | ICD-10-CM | POA: Diagnosis not present

## 2024-07-30 DIAGNOSIS — Z86718 Personal history of other venous thrombosis and embolism: Secondary | ICD-10-CM | POA: Diagnosis not present

## 2024-07-30 DIAGNOSIS — K8020 Calculus of gallbladder without cholecystitis without obstruction: Secondary | ICD-10-CM | POA: Diagnosis not present

## 2024-07-30 DIAGNOSIS — E039 Hypothyroidism, unspecified: Secondary | ICD-10-CM | POA: Diagnosis not present

## 2024-07-30 DIAGNOSIS — K5730 Diverticulosis of large intestine without perforation or abscess without bleeding: Secondary | ICD-10-CM | POA: Diagnosis not present

## 2024-08-04 DIAGNOSIS — K760 Fatty (change of) liver, not elsewhere classified: Secondary | ICD-10-CM | POA: Diagnosis not present

## 2024-08-04 DIAGNOSIS — Z604 Social exclusion and rejection: Secondary | ICD-10-CM | POA: Diagnosis not present

## 2024-08-04 DIAGNOSIS — K5730 Diverticulosis of large intestine without perforation or abscess without bleeding: Secondary | ICD-10-CM | POA: Diagnosis not present

## 2024-08-04 DIAGNOSIS — N3000 Acute cystitis without hematuria: Secondary | ICD-10-CM | POA: Diagnosis not present

## 2024-08-04 DIAGNOSIS — I10 Essential (primary) hypertension: Secondary | ICD-10-CM | POA: Diagnosis not present

## 2024-08-04 DIAGNOSIS — Z86711 Personal history of pulmonary embolism: Secondary | ICD-10-CM | POA: Diagnosis not present

## 2024-08-04 DIAGNOSIS — Z86718 Personal history of other venous thrombosis and embolism: Secondary | ICD-10-CM | POA: Diagnosis not present

## 2024-08-04 DIAGNOSIS — Z7901 Long term (current) use of anticoagulants: Secondary | ICD-10-CM | POA: Diagnosis not present

## 2024-08-04 DIAGNOSIS — K8020 Calculus of gallbladder without cholecystitis without obstruction: Secondary | ICD-10-CM | POA: Diagnosis not present

## 2024-08-04 DIAGNOSIS — E039 Hypothyroidism, unspecified: Secondary | ICD-10-CM | POA: Diagnosis not present

## 2024-08-04 DIAGNOSIS — I700 Atherosclerosis of aorta: Secondary | ICD-10-CM | POA: Diagnosis not present

## 2024-08-28 DIAGNOSIS — I2510 Atherosclerotic heart disease of native coronary artery without angina pectoris: Secondary | ICD-10-CM | POA: Diagnosis not present

## 2024-08-28 DIAGNOSIS — D6869 Other thrombophilia: Secondary | ICD-10-CM | POA: Diagnosis not present

## 2024-08-29 DIAGNOSIS — M25561 Pain in right knee: Secondary | ICD-10-CM | POA: Diagnosis not present

## 2024-08-29 DIAGNOSIS — Z96651 Presence of right artificial knee joint: Secondary | ICD-10-CM | POA: Diagnosis not present

## 2024-08-29 DIAGNOSIS — R202 Paresthesia of skin: Secondary | ICD-10-CM | POA: Diagnosis not present

## 2024-10-02 ENCOUNTER — Ambulatory Visit (HOSPITAL_BASED_OUTPATIENT_CLINIC_OR_DEPARTMENT_OTHER): Payer: Self-pay | Admitting: Orthopaedic Surgery

## 2024-10-07 ENCOUNTER — Ambulatory Visit: Payer: Medicare Other

## 2024-10-07 DIAGNOSIS — I4430 Unspecified atrioventricular block: Secondary | ICD-10-CM | POA: Diagnosis not present

## 2024-10-08 LAB — CUP PACEART REMOTE DEVICE CHECK
Battery Remaining Longevity: 94 mo
Battery Remaining Percentage: 74 %
Battery Voltage: 3.02 V
Brady Statistic AP VP Percent: 1 %
Brady Statistic AP VS Percent: 1.2 %
Brady Statistic AS VP Percent: 1 %
Brady Statistic AS VS Percent: 99 %
Brady Statistic RA Percent Paced: 1 %
Brady Statistic RV Percent Paced: 1 %
Date Time Interrogation Session: 20251215041431
Implantable Lead Connection Status: 753985
Implantable Lead Connection Status: 753985
Implantable Lead Implant Date: 20220321
Implantable Lead Implant Date: 20220321
Implantable Lead Location: 753859
Implantable Lead Location: 753860
Implantable Pulse Generator Implant Date: 20220321
Lead Channel Impedance Value: 450 Ohm
Lead Channel Impedance Value: 540 Ohm
Lead Channel Pacing Threshold Amplitude: 0.75 V
Lead Channel Pacing Threshold Amplitude: 0.75 V
Lead Channel Pacing Threshold Pulse Width: 0.4 ms
Lead Channel Pacing Threshold Pulse Width: 0.4 ms
Lead Channel Sensing Intrinsic Amplitude: 12 mV
Lead Channel Sensing Intrinsic Amplitude: 3.1 mV
Lead Channel Setting Pacing Amplitude: 2 V
Lead Channel Setting Pacing Amplitude: 2.5 V
Lead Channel Setting Pacing Pulse Width: 0.4 ms
Lead Channel Setting Sensing Sensitivity: 2 mV
Pulse Gen Model: 2272
Pulse Gen Serial Number: 3910619

## 2024-10-11 ENCOUNTER — Ambulatory Visit: Payer: Self-pay | Admitting: Internal Medicine

## 2024-10-11 NOTE — Progress Notes (Signed)
 Remote PPM Transmission

## 2024-11-11 NOTE — ED Provider Notes (Signed)
 Patient placed in [facility] pathway, seen and evaluated for chief complaint of EMS called out for CP that started today. Also complaining of constipation for 3-4 days but took Miralax  and has had a bowel movement and still has abdominal pain.   Pertinent exam findings include non-toxic in appearance. Based on initial evaluation, labs are currently indicated and radiology studies are currently indicated as allowed for current processes and treatments as applicable in a triage setting and could be different than if patient were seen in a main treatment area or dependent on labs/imagining after results are displayed.  Patient counseled on process, plan, and necessity for staying for completing the evaluation.   This document serves as a record of services personally performed by Maranda Grate PA-C.   Emergency Department Provider Note  This document was created using the aid of voice recognition Dragon dictation software.    Provider at bedside: 2:55 PM  History obtained from the: Patient and Relative  History   Chief Complaint  Patient presents with   Chest Pain   Abdominal Pain      History provided by:  Patient and relative Language interpreter used: No     [name] is a 89 y.o. female who presents to the ED with complaints of chest pain, onset yesterday. Patient endorses chest pain that began last night and eventually resolved, but then returned this morning. She reports that the pain was in her upper left chest and radiated into her left shoulder and her left shoulder blade. She endorses some associated shortness of breath with her pain. 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 Patient denies any nausea or vomiting.   Patient endorses a secondary complaint of abdominal pain to his left lower quadrant that is somewhat chronic, but has worsened over the past few days. Patient reports these pains are sharp and sudden, and also  endorses some bloating and constipation. She reports that her last bowel movement was this morning, but states it was smaller than usual. She endorses a history of diverticulosis. She denies any diarrhea, vomiting, or any other medical complaints at this time.  Past Medical History Reviewed in chart  Past Surgical History Reviewed in chart  Medications Reviewed in chart   Allergies Allergies[1]   Family History Reviewed in chart   Social History Reviewed in chart   Review of Systems  Review of Systems Positives and pertinent negatives as per HPI.  All other systems were reviewed and are negative.   Physical Exam   Vitals:   11/11/24 1400 11/11/24 1500 11/11/24 1600 11/11/24 1713  BP: 159/89 (!) 166/74 (!) 166/78 156/88  BP Location:      Patient Position:      Pulse: 76 73 82 83  Resp: (!) 22 20 (!) 31 (!) 25  Temp:      TempSrc:      SpO2: 96% 95% 98% 98%  Weight:      Height:        Physical Exam Vitals and nursing note reviewed.  Constitutional:      Appearance: Normal appearance.  HENT:     Head: Normocephalic and atraumatic.     Nose: Nose normal.     Mouth/Throat:     Mouth: Mucous membranes are moist.  Eyes:     Extraocular Movements: Extraocular movements intact.     Pupils: Pupils are equal, round, and reactive to light.  Cardiovascular:     Rate and  Rhythm: Normal rate and regular rhythm.     Heart sounds: Normal heart sounds.  Pulmonary:     Effort: Pulmonary effort is normal. No respiratory distress.     Breath sounds: Normal breath sounds.  Abdominal:     General: Bowel sounds are normal.     Palpations: Abdomen is soft.     Tenderness: There is abdominal tenderness in the left lower quadrant. There is no guarding or rebound.  Musculoskeletal:     Right lower leg: No edema.     Left lower leg: No edema.  Skin:    General: Skin is warm and dry.     Capillary Refill: Capillary refill takes less than 2 seconds.     Coloration: Skin is  not jaundiced.  Neurological:     General: No focal deficit present.     Mental Status: She is alert and oriented to person, place, and time.     GCS: GCS eye subscore is 4. GCS verbal subscore is 5. GCS motor subscore is 6.     Cranial Nerves: No facial asymmetry.     Sensory: Sensation is intact.     Motor: Motor function is intact.  Psychiatric:        Attention and Perception: Attention normal.        Mood and Affect: Mood normal.        Speech: Speech normal.        Behavior: Behavior normal.     Labs  Labs reviewed in chart.     Radiology   Radiology tests reviewed in chart.  EKG  EKG my Interpretation: EKG time: 12:24:53 Interpretation time: 5:35 PM Rate: 82 Rhythm: normal sinus rhythm Axis: normal Intervals: RBBB ST-T Waves: Normal ST-T Waves Comparison with Old: No   ED Course   ED Course as of 11/11/24 1735  Mon Nov 11, 2024  1458 2 view chest x-ray image reviewed and inter by me demonstrating pacemaker in the left chest.  Wires intact.  No large pleural effusions. [DF]  1459 CMP without AKI.  No worrisome electrolyte abnormalities.  CBC with no critical anemia or worsening leukocytosis.  Initial troponin is normal at less than 3 [DF]    ED Course User Index [DF] Alm Ozell Perry, MD    Procedure Note  Procedures  Medical Decision Making  Clinical Complexity  Patient's presentation is most consistent with acute presentation with potential threat to life or bodily function.     Medical Decision Making CBC, CMP for leukocytosis, leukopenia, anemia, thrombocytopenia hyponatremia, hypernatremia, hypokalemia, hyperkalemia, acidosis, acute kidney injury, hyperglycemia, acute liver injury. Troponin was ordered to evaluate for STEMI, NSTEMI, CHF, Myocarditis, Endocarditis, PE Urinalysis ordered to evaluate for infection, hematuria, proteinuria, dehydration, glucosuria. Chest xray ordered to evaluate for pneumonia, pneumothorax, pleural effusion,  pulmonary edema, cardiomegaly, aortic abnormality. EKG ordered to evaluate for Acute MI, arrhythmia, heart rate irregularities, conduction abnormality, intervals, QT prolongation, ischemia, electrolyte abnormality.   Problems Addressed: Abdominal pain, LLQ: complicated acute illness or injury Chest discomfort: complicated acute illness or injury Constipation, unspecified constipation type: complicated acute illness or injury Diverticulosis: complicated acute illness or injury  Amount and/or Complexity of Data Reviewed Independent Historian:     Details: Relative at bedside assisted with patient history.  External Data Reviewed: notes.    Details: Reviewed hospital discharge summary from 04/11/2024 for abdominal pain and nausea with likely UTI.  Labs: ordered. Decision-making details documented in ED Course. Radiology: ordered and independent interpretation performed. Decision-making details documented in  ED Course. ECG/medicine tests: ordered and independent interpretation performed. Decision-making details documented in ED Course.  Risk Prescription drug management.    ED Clinical Impression   1. Chest discomfort   2. Abdominal pain, LLQ   3. Diverticulosis   4. Constipation, unspecified constipation type      ED Assessment/Plan  Plan: Patient valuated for several complaints including chest discomfort in the left upper chest along with a left lower quadrant abdominal pain.  We evaluated her chest with lab work chest x-ray EKG finding no evidence of ACS pneumonia or other worrisome pathology.  She is chronically anticoagulated and has not missed any doses of her DOAC so PE is felt to be less likely the etiology of her symptoms.  Terms of her abdominal pain she has a history of diverticulosis and in discussion with her and her family bedside we did a CT scan abdomen pelvis showing diverticulosis but no diverticulitis.  Lab work otherwise reassuring without worsening leukocytosis or  AKI.  She was medically cleared and discharged home.  After I reviewed all the test results with her and her daughter she did ask me about seeing a psychiatrist but not because she was suicidal homicidal or having any active hallucinations so she was agreeable to an outpatient referral to psychiatry for follow-up.   New Prescriptions   No medications on file      FOLLOW UP [facility] [address] [place]  [address] [phone] Schedule an appointment as soon as possible for a visit  for outpatient psychiatric concerns  Lonell JAYSON Collet, DO [address] [place] [address] [place] [address] [phone]  Schedule an appointment as soon as possible for a visit  for medical follow up   ED Disposition     ED Disposition  Discharge   Condition  Stable   Comment  --         This document serves as a record of services personally performed by Alm Perry, MD. It was created on their behalf by Karena LOISE Hurst, Scribe, a trained medical scribe. The creation of this record is the providers dictation and/or activities during the visit.   Electronically signed by: Karena LOISE Hurst, Scribe 11/11/2024 5:35 PM       [1] Allergies Allergen Reactions   Codeine Mental Status Changes and Dizziness

## 2024-11-11 NOTE — ED Triage Notes (Addendum)
 Pt arriving via [facility] from home with c/o constipation x3-4 days. +abdomina pain. EMS reports that she took miralax  and had a BM yesterday. Pt also complains of dizziness and chest pain with palpation to her chest. Pt took 324mg  ASA PTA.

## 2025-04-07 ENCOUNTER — Encounter

## 2025-07-07 ENCOUNTER — Encounter

## 2025-10-06 ENCOUNTER — Encounter

## 2026-01-05 ENCOUNTER — Encounter

## 2026-04-06 ENCOUNTER — Encounter
